# Patient Record
Sex: Female | Born: 1985 | Race: White | Hispanic: No | Smoking: Current every day smoker
Health system: Southern US, Community
[De-identification: ages and names within clinical notes are randomized; demographics above are authoritative.]

## PROBLEM LIST (undated history)

## (undated) DIAGNOSIS — E282 Polycystic ovarian syndrome: Secondary | ICD-10-CM

## (undated) DIAGNOSIS — Z8759 Personal history of other complications of pregnancy, childbirth and the puerperium: Secondary | ICD-10-CM

## (undated) DIAGNOSIS — B009 Herpesviral infection, unspecified: Secondary | ICD-10-CM

## (undated) DIAGNOSIS — F53 Postpartum depression: Secondary | ICD-10-CM

## (undated) DIAGNOSIS — Z3046 Encounter for surveillance of implantable subdermal contraceptive: Secondary | ICD-10-CM

## (undated) DIAGNOSIS — Z3490 Encounter for supervision of normal pregnancy, unspecified, unspecified trimester: Secondary | ICD-10-CM

## (undated) DIAGNOSIS — Z8659 Personal history of other mental and behavioral disorders: Secondary | ICD-10-CM

## (undated) DIAGNOSIS — O99345 Other mental disorders complicating the puerperium: Secondary | ICD-10-CM

## (undated) DIAGNOSIS — Z309 Encounter for contraceptive management, unspecified: Secondary | ICD-10-CM

## (undated) HISTORY — DX: Personal history of other complications of pregnancy, childbirth and the puerperium: Z87.59

## (undated) HISTORY — DX: Encounter for surveillance of implantable subdermal contraceptive: Z30.46

## (undated) HISTORY — DX: Herpesviral infection, unspecified: B00.9

## (undated) HISTORY — DX: Personal history of other mental and behavioral disorders: Z86.59

## (undated) HISTORY — PX: NO PAST SURGERIES: SHX2092

## (undated) HISTORY — DX: Polycystic ovarian syndrome: E28.2

## (undated) HISTORY — DX: Other mental disorders complicating the puerperium: O99.345

## (undated) HISTORY — DX: Encounter for contraceptive management, unspecified: Z30.9

## (undated) HISTORY — DX: Postpartum depression: F53.0

---
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
## 2006-01-16 ENCOUNTER — Ambulatory Visit (HOSPITAL_COMMUNITY): Admission: RE | Admit: 2006-01-16 | Discharge: 2006-01-16 | Payer: Self-pay | Admitting: Family Medicine

## 2007-12-27 ENCOUNTER — Emergency Department (HOSPITAL_COMMUNITY): Admission: EM | Admit: 2007-12-27 | Discharge: 2007-12-27 | Payer: Self-pay | Admitting: Emergency Medicine

## 2009-06-22 ENCOUNTER — Emergency Department (HOSPITAL_COMMUNITY): Admission: EM | Admit: 2009-06-22 | Discharge: 2009-06-22 | Payer: Self-pay | Admitting: Emergency Medicine

## 2010-12-06 ENCOUNTER — Emergency Department (HOSPITAL_COMMUNITY)
Admission: EM | Admit: 2010-12-06 | Discharge: 2010-12-06 | Disposition: A | Discharge status: Home / Self Care | Payer: No Typology Code available for payment source | Attending: Emergency Medicine | Admitting: Emergency Medicine

## 2010-12-06 ENCOUNTER — Encounter: Payer: Self-pay | Admitting: Emergency Medicine

## 2010-12-06 DIAGNOSIS — S71009A Unspecified open wound, unspecified hip, initial encounter: Secondary | ICD-10-CM | POA: Insufficient documentation

## 2010-12-06 DIAGNOSIS — F17200 Nicotine dependence, unspecified, uncomplicated: Secondary | ICD-10-CM | POA: Insufficient documentation

## 2010-12-06 DIAGNOSIS — S71112A Laceration without foreign body, left thigh, initial encounter: Secondary | ICD-10-CM

## 2010-12-06 DIAGNOSIS — S71109A Unspecified open wound, unspecified thigh, initial encounter: Secondary | ICD-10-CM | POA: Insufficient documentation

## 2010-12-06 DIAGNOSIS — W268XXA Contact with other sharp object(s), not elsewhere classified, initial encounter: Secondary | ICD-10-CM | POA: Insufficient documentation

## 2010-12-06 MED ORDER — LIDOCAINE-EPINEPHRINE (PF) 1 %-1:200000 IJ SOLN
INTRAMUSCULAR | Status: AC
  Administered 2010-12-06: 15:00:00
  Filled 2010-12-06: qty 10

## 2010-12-06 MED ORDER — TETANUS-DIPHTH-ACELL PERTUSSIS 5-2.5-18.5 LF-MCG/0.5 IM SUSP
0.5000 mL | Freq: Once | INTRAMUSCULAR | Status: AC
  Administered 2010-12-06: 0.5 mL via INTRAMUSCULAR
  Filled 2010-12-06: qty 0.5
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
## 2010-12-06 NOTE — ED Notes (Signed)
PA in to suture

## 2010-12-07 NOTE — ED Provider Notes (Signed)
Evaluation and management procedures were performed by the PA/NP under my supervision/collaboration.    Felisa Bonier, MD 12/07/10 2118

## 2011-01-19 ENCOUNTER — Encounter (HOSPITAL_COMMUNITY): Payer: Self-pay | Admitting: *Deleted

## 2011-01-19 ENCOUNTER — Emergency Department (HOSPITAL_COMMUNITY): Payer: No Typology Code available for payment source

## 2011-01-19 ENCOUNTER — Emergency Department (HOSPITAL_COMMUNITY)
Admission: EM | Admit: 2011-01-19 | Discharge: 2011-01-19 | Disposition: A | Discharge status: Home / Self Care | Payer: No Typology Code available for payment source | Attending: Emergency Medicine | Admitting: Emergency Medicine

## 2011-01-19 DIAGNOSIS — F17200 Nicotine dependence, unspecified, uncomplicated: Secondary | ICD-10-CM | POA: Insufficient documentation

## 2011-01-19 DIAGNOSIS — R10817 Generalized abdominal tenderness: Secondary | ICD-10-CM | POA: Insufficient documentation

## 2011-01-19 DIAGNOSIS — S301XXA Contusion of abdominal wall, initial encounter: Secondary | ICD-10-CM | POA: Insufficient documentation

## 2011-01-19 DIAGNOSIS — R490 Dysphonia: Secondary | ICD-10-CM | POA: Insufficient documentation

## 2011-01-19 DIAGNOSIS — IMO0002 Reserved for concepts with insufficient information to code with codable children: Secondary | ICD-10-CM | POA: Insufficient documentation

## 2011-01-19 DIAGNOSIS — S1093XA Contusion of unspecified part of neck, initial encounter: Secondary | ICD-10-CM

## 2011-01-19 LAB — POCT PREGNANCY, URINE: Preg Test, Ur: NEGATIVE

## 2011-01-19 LAB — CBC
MCH: 30.1 pg (ref 26.0–34.0)
RBC: 4.85 MIL/uL (ref 3.87–5.11)
RDW: 12.6 % (ref 11.5–15.5)
WBC: 10.7 10*3/uL — ABNORMAL HIGH (ref 4.0–10.5)

## 2011-01-19 LAB — DIFFERENTIAL
Basophils Relative: 1 % (ref 0–1)
Lymphocytes Relative: 24 % (ref 12–46)
Monocytes Relative: 8 % (ref 3–12)
Neutrophils Relative %: 66 % (ref 43–77)

## 2011-01-19 LAB — COMPREHENSIVE METABOLIC PANEL
ALT: 15 U/L (ref 0–35)
AST: 13 U/L (ref 0–37)
Alkaline Phosphatase: 57 U/L (ref 39–117)
BUN: 10 mg/dL (ref 6–23)
Calcium: 9.8 mg/dL (ref 8.4–10.5)
GFR calc non Af Amer: >90 mL/min (ref >90)
Glucose, Bld: 105 mg/dL — ABNORMAL HIGH (ref 70–99)
Total Bilirubin: 0.2 mg/dL — ABNORMAL LOW (ref 0.3–1.2)
Total Protein: 7.5 g/dL (ref 6.0–8.3)

## 2011-01-19 LAB — URINALYSIS, ROUTINE W REFLEX MICROSCOPIC
Bilirubin Urine: NEGATIVE
Glucose, UA: NEGATIVE mg/dL
Hgb urine dipstick: NEGATIVE
Urobilinogen, UA: 0.2 mg/dL (ref 0.0–1.0)

## 2011-01-19 LAB — LIPASE, BLOOD: Lipase: 29 U/L (ref 11–59)

## 2011-01-19 MED ORDER — SODIUM CHLORIDE 0.9 % IV SOLN
INTRAVENOUS | Status: DC
  Administered 2011-01-19: 19:00:00 via INTRAVENOUS

## 2011-01-19 MED ORDER — IOHEXOL 300 MG/ML  SOLN
75.0000 mL | Freq: Once | INTRAMUSCULAR | Status: AC | PRN
  Administered 2011-01-19: 75 mL via INTRAVENOUS

## 2011-01-19 MED ORDER — IBUPROFEN 600 MG PO TABS: 600.0000 mg | ORAL_TABLET | Freq: Four times a day (QID) | ORAL | Status: AC | PRN

## 2011-01-19 MED ORDER — HYDROMORPHONE HCL PF 1 MG/ML IJ SOLN
1.0000 mg | Freq: Once | INTRAMUSCULAR | Status: AC
  Administered 2011-01-19: 1 mg via INTRAVENOUS
  Filled 2011-01-19: qty 1

## 2011-01-19 MED ORDER — KETOROLAC TROMETHAMINE 30 MG/ML IJ SOLN
30.0000 mg | Freq: Once | INTRAMUSCULAR | Status: AC
  Administered 2011-01-19: 30 mg via INTRAVENOUS
  Filled 2011-01-19: qty 1

## 2011-01-19 MED ORDER — DEXAMETHASONE SODIUM PHOSPHATE 10 MG/ML IJ SOLN
10.0000 mg | Freq: Once | INTRAMUSCULAR | Status: AC
  Administered 2011-01-19: 10 mg via INTRAVENOUS
  Filled 2011-01-19: qty 1

## 2011-01-19 MED ORDER — HYDROCODONE-ACETAMINOPHEN 5-325 MG PO TABS: 2.0000 | ORAL_TABLET | ORAL | Status: AC | PRN
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
## 2011-01-19 NOTE — ED Notes (Signed)
Patient transported to CT. NAD noted.  
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
## 2011-03-14 ENCOUNTER — Ambulatory Visit (HOSPITAL_COMMUNITY)
Admission: RE | Admit: 2011-03-14 | Discharge: 2011-03-14 | Disposition: A | Discharge status: Home / Self Care | Payer: Federal, State, Local not specified - PPO | Source: Ambulatory Visit | Attending: Family Medicine | Admitting: Family Medicine

## 2011-03-14 ENCOUNTER — Other Ambulatory Visit: Payer: Self-pay | Admitting: Family Medicine

## 2011-03-14 DIAGNOSIS — R1031 Right lower quadrant pain: Secondary | ICD-10-CM | POA: Insufficient documentation

## 2011-03-14 DIAGNOSIS — R52 Pain, unspecified: Secondary | ICD-10-CM

## 2011-04-21 ENCOUNTER — Emergency Department (HOSPITAL_COMMUNITY): Payer: No Typology Code available for payment source

## 2011-04-21 ENCOUNTER — Emergency Department (HOSPITAL_COMMUNITY)
Admission: EM | Admit: 2011-04-21 | Discharge: 2011-04-21 | Disposition: A | Discharge status: Home / Self Care | Payer: No Typology Code available for payment source | Attending: Emergency Medicine | Admitting: Emergency Medicine

## 2011-04-21 ENCOUNTER — Encounter (HOSPITAL_COMMUNITY): Payer: Self-pay | Admitting: Emergency Medicine

## 2011-04-21 DIAGNOSIS — T07XXXA Unspecified multiple injuries, initial encounter: Secondary | ICD-10-CM

## 2011-04-21 DIAGNOSIS — S0120XA Unspecified open wound of nose, initial encounter: Secondary | ICD-10-CM | POA: Insufficient documentation

## 2011-04-21 DIAGNOSIS — R079 Chest pain, unspecified: Secondary | ICD-10-CM | POA: Insufficient documentation

## 2011-04-21 DIAGNOSIS — S0121XA Laceration without foreign body of nose, initial encounter: Secondary | ICD-10-CM

## 2011-04-21 DIAGNOSIS — S0180XA Unspecified open wound of other part of head, initial encounter: Secondary | ICD-10-CM | POA: Insufficient documentation

## 2011-04-21 DIAGNOSIS — R51 Headache: Secondary | ICD-10-CM | POA: Insufficient documentation

## 2011-04-21 DIAGNOSIS — R1032 Left lower quadrant pain: Secondary | ICD-10-CM | POA: Insufficient documentation

## 2011-04-21 DIAGNOSIS — M79609 Pain in unspecified limb: Secondary | ICD-10-CM | POA: Insufficient documentation

## 2011-04-21 LAB — URINALYSIS, ROUTINE W REFLEX MICROSCOPIC
Bilirubin Urine: NEGATIVE
Ketones, ur: NEGATIVE mg/dL
Leukocytes, UA: NEGATIVE
Nitrite: NEGATIVE
Specific Gravity, Urine: 1.017 (ref 1.005–1.030)
Urobilinogen, UA: 1 mg/dL (ref 0.0–1.0)

## 2011-04-21 LAB — BASIC METABOLIC PANEL
BUN: 5 mg/dL — ABNORMAL LOW (ref 6–23)
Chloride: 111 mEq/L (ref 96–112)
Creatinine, Ser: 0.52 mg/dL (ref 0.50–1.10)
GFR calc Af Amer: >90 mL/min (ref >90)
GFR calc non Af Amer: >90 mL/min (ref >90)
Potassium: 3.3 mEq/L — ABNORMAL LOW (ref 3.5–5.1)

## 2011-04-21 LAB — URINE MICROSCOPIC-ADD ON

## 2011-04-21 LAB — CBC
MCV: 87.9 fL (ref 78.0–100.0)
Platelets: 146 10*3/uL — ABNORMAL LOW (ref 150–400)
RBC: 4.89 MIL/uL (ref 3.87–5.11)
RDW: 12.9 % (ref 11.5–15.5)
WBC: 11.7 10*3/uL — ABNORMAL HIGH (ref 4.0–10.5)

## 2011-04-21 LAB — ETHANOL: Alcohol, Ethyl (B): 61 mg/dL — ABNORMAL HIGH (ref 0–11)

## 2011-04-21 MED ORDER — FENTANYL CITRATE 0.05 MG/ML IJ SOLN
100.0000 ug | Freq: Once | INTRAMUSCULAR | Status: AC
  Administered 2011-04-21: 100 ug via INTRAVENOUS
  Filled 2011-04-21: qty 2

## 2011-04-21 MED ORDER — BACITRACIN ZINC 500 UNIT/GM EX OINT: TOPICAL_OINTMENT | Freq: Two times a day (BID) | CUTANEOUS | Status: AC

## 2011-04-21 MED ORDER — TETANUS-DIPHTH-ACELL PERTUSSIS 5-2.5-18.5 LF-MCG/0.5 IM SUSP
0.5000 mL | Freq: Once | INTRAMUSCULAR | Status: DC
  Filled 2011-04-21: qty 0.5

## 2011-04-21 MED ORDER — OXYCODONE-ACETAMINOPHEN 5-325 MG PO TABS
2.0000 | ORAL_TABLET | Freq: Once | ORAL | Status: AC
  Administered 2011-04-21: 2 via ORAL
  Filled 2011-04-21: qty 2

## 2011-04-21 MED ORDER — IOHEXOL 300 MG/ML  SOLN: 100.0000 mL | Freq: Once | INTRAMUSCULAR | Status: DC | PRN

## 2011-04-21 MED ORDER — IOHEXOL 300 MG/ML  SOLN
100.0000 mL | Freq: Once | INTRAMUSCULAR | Status: AC | PRN
  Administered 2011-04-21: 100 mL via INTRAVENOUS

## 2011-04-21 MED ORDER — HYDROCODONE-ACETAMINOPHEN 5-500 MG PO TABS: 1.0000 | ORAL_TABLET | Freq: Four times a day (QID) | ORAL | Status: AC | PRN

## 2011-04-21 MED ORDER — NAPROXEN 500 MG PO TABS: 500.0000 mg | ORAL_TABLET | Freq: Two times a day (BID) | ORAL | Status: AC
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
## 2011-04-21 NOTE — ED Provider Notes (Signed)
LACERATION REPAIR Performed by: Anne Shutter, Latissa Frick Authorized by: Anne Shutter, Herbert Seta Consent: Verbal consent obtained. Risks and benefits: risks, benefits and alternatives were discussed Consent given by: patient Patient identity confirmed: provided demographic data Prepped and Draped in normal sterile fashion Wound explored  Laceration Location: bridge of nose  Laceration Length: 2. cm   No Foreign Bodies seen or palpated  Anesthesia: local infiltration  Local anesthetic: lidocaine 2% with epinephrine  Anesthetic total:  3 ml  Irrigation method: syringe Amount of cleaning: standard  Skin closure: 6-0 Nylon  Number of sutures: 4   Technique: simple interrupted  Patient tolerance: Patient tolerated the procedure well with no immediate complications.  Pascal Lux [place], PA-C 04/21/11 [phone]
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
## 2011-04-21 NOTE — ED Notes (Signed)
PA at bedside suturing.

## 2011-04-21 NOTE — Discharge Instructions (Signed)
Your x-rays show no signs of internal injuries.  Please use the antibiotic cream on the wound, have your family doctor recheck your stitches within 2 days to make sure there becoming infected. The stitches on her face should, within 5 days by her family doctor or the emergency department.  Take Naprosyn twice a day, hydrocodone for severe pain
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
## 2011-04-23 NOTE — ED Provider Notes (Signed)
Medical screening examination/treatment/procedure(s) were conducted as a shared visit with non-physician practitioner(s) and myself.  I personally evaluated the patient during the encounter  Please see my separate respective documentation pertaining to this patient encounter    Vida Roller, MD 04/23/11 [phone]

## 2012-04-29 ENCOUNTER — Emergency Department (HOSPITAL_COMMUNITY)
Admission: EM | Admit: 2012-04-29 | Discharge: 2012-04-29 | Disposition: A | Discharge status: Home / Self Care | Payer: Federal, State, Local not specified - PPO | Attending: Emergency Medicine | Admitting: Emergency Medicine

## 2012-04-29 ENCOUNTER — Encounter (HOSPITAL_COMMUNITY): Payer: Self-pay | Admitting: *Deleted

## 2012-04-29 DIAGNOSIS — F17200 Nicotine dependence, unspecified, uncomplicated: Secondary | ICD-10-CM | POA: Insufficient documentation

## 2012-04-29 DIAGNOSIS — K047 Periapical abscess without sinus: Secondary | ICD-10-CM

## 2012-04-29 DIAGNOSIS — H9209 Otalgia, unspecified ear: Secondary | ICD-10-CM | POA: Insufficient documentation

## 2012-04-29 DIAGNOSIS — R51 Headache: Secondary | ICD-10-CM | POA: Insufficient documentation

## 2012-04-29 DIAGNOSIS — Z8742 Personal history of other diseases of the female genital tract: Secondary | ICD-10-CM | POA: Insufficient documentation

## 2012-04-29 DIAGNOSIS — K029 Dental caries, unspecified: Secondary | ICD-10-CM | POA: Insufficient documentation

## 2012-04-29 MED ORDER — AMOXICILLIN 250 MG PO CAPS: 250.0000 mg | ORAL_CAPSULE | Freq: Three times a day (TID) | ORAL | Status: DC

## 2012-04-29 MED ORDER — IBUPROFEN 600 MG PO TABS: 600.0000 mg | ORAL_TABLET | Freq: Four times a day (QID) | ORAL | Status: DC | PRN

## 2012-04-29 MED ORDER — TRAMADOL HCL 50 MG PO TABS: 50.0000 mg | ORAL_TABLET | Freq: Four times a day (QID) | ORAL | Status: DC | PRN

## 2012-04-29 NOTE — ED Provider Notes (Signed)
History     CSN: [id number]  Arrival date & time 04/29/12  1437   First MD Initiated Contact with Patient 04/29/12 1603      Chief Complaint  Patient presents with  . Dental Pain    (Consider location/radiation/quality/duration/timing/severity/associated sxs/prior treatment) HPI Ashley Levy is a 27 y.o. female who presents to the ED with dental pain. The pain started 3 days ago. She describes the pain as throbbing. She rates the pain as 8/10. She has tried salt water, vinegar and all the suggestions she found on Google.  Nothing has helped the pain. She has had dental problems in the past and does not have a dentist. She denies fever, chills, nausea or vomiting. The history was provided by the patient.  Past Medical History  Diagnosis Date  . Polycystic ovarian syndrome     Past Surgical History  Procedure Laterality Date  . Fracture surgery      No family history on file.  History  Substance Use Topics  . Smoking status: Current Every Day Smoker -- 1.00 packs/day    Types: Cigarettes  . Smokeless tobacco: Not on file  . Alcohol Use: Yes     Comment: occ. use    OB History   Grav Para Term Preterm Abortions TAB SAB Ect Mult Living                  Review of Systems  Constitutional: Negative for fever and chills.  HENT: Positive for ear pain and dental problem. Negative for facial swelling and neck pain.   Respiratory: Negative for cough and wheezing.   Cardiovascular: Negative for chest pain.  Gastrointestinal: Negative for nausea, vomiting and abdominal pain.  Musculoskeletal: Negative for back pain.  Skin: Negative for wound.  Neurological: Positive for headaches. Negative for syncope.  Psychiatric/Behavioral: Negative for confusion. The patient is not nervous/anxious.     Allergies  Review of patient's allergies indicates no known allergies.  Home Medications  No current outpatient prescriptions on file.  BP 126/81  Pulse 69  Temp(Src) 98.7 F  (37.1 C) (Oral)  Resp 18  Ht 5' 1.5" (1.562 m)  Wt 147 lb (66.679 kg)  BMI 27.33 kg/m2  SpO2 98%  Physical Exam  Nursing note and vitals reviewed. Constitutional: She is oriented to person, place, and time. She appears well-developed and well-nourished. No distress.  HENT:  Head: Normocephalic and atraumatic.  Right Ear: Tympanic membrane normal.  Left Ear: Tympanic membrane normal.  Nose: Nose normal.  Mouth/Throat: Uvula is midline, oropharynx is clear and moist and mucous membranes are normal. Dental abscesses and dental caries present.    abscessed tooth  Eyes: EOM are normal. Pupils are equal, round, and reactive to light.  Neck: Neck supple.  Cardiovascular: Normal rate and regular rhythm.   Pulmonary/Chest: Effort normal.  Abdominal: Soft. There is no tenderness.  Musculoskeletal: Normal range of motion. She exhibits no edema.  Neurological: She is alert and oriented to person, place, and time. No cranial nerve deficit.  Skin: Skin is warm and dry.  Psychiatric: She has a normal mood and affect. Her behavior is normal. Judgment and thought content normal.   Assessment: 27 y.o. female with dental pain   Abscessed tooth  Plan:  Amoxil   Pain management   Follow up with dentist.   Procedures   MDM  I have reviewed this patient's vital signs, nurses notes, examined the patient and discussed with the patient clinical findings and plan of care. Patient  voices understanding.    Medication List    TAKE these medications       amoxicillin 250 MG capsule  Commonly known as:  AMOXIL  Take 1 capsule (250 mg total) by mouth 3 (three) times daily.     ibuprofen 600 MG tablet  Commonly known as:  ADVIL,MOTRIN  Take 1 tablet (600 mg total) by mouth every 6 (six) hours as needed for pain.     traMADol 50 MG tablet  Commonly known as:  ULTRAM  Take 1 tablet (50 mg total) by mouth every 6 (six) hours as needed for pain.               Janne Napoleon, [place] 04/29/12  [phone]

## 2012-04-29 NOTE — ED Provider Notes (Signed)
Medical screening examination/treatment/procedure(s) were performed by non-physician practitioner and as supervising physician I was immediately available for consultation/collaboration. Devoria Albe, MD, Arm[facility] Gang   Ward Givens, MD 04/29/12 [phone]

## 2012-04-29 NOTE — ED Notes (Signed)
Toothache for 3 days. 

## 2012-04-29 NOTE — ED Notes (Signed)
nad noted prior to dc. Dc instructions reviewed and 3 scripts given to pt prior to dc

## 2012-12-30 ENCOUNTER — Other Ambulatory Visit (HOSPITAL_COMMUNITY)
Admission: RE | Admit: 2012-12-30 | Discharge: 2012-12-30 | Disposition: A | Discharge status: Home / Self Care | Payer: Federal, State, Local not specified - PPO | Source: Ambulatory Visit | Attending: Obstetrics and Gynecology | Admitting: Obstetrics and Gynecology

## 2012-12-30 ENCOUNTER — Encounter: Payer: Self-pay | Admitting: Advanced Practice Midwife

## 2012-12-30 ENCOUNTER — Ambulatory Visit (INDEPENDENT_AMBULATORY_CARE_PROVIDER_SITE_OTHER): Payer: Federal, State, Local not specified - PPO | Admitting: Advanced Practice Midwife

## 2012-12-30 VITALS — BP 98/60 | Ht 61.5 in | Wt 151.0 lb

## 2012-12-30 DIAGNOSIS — Z01419 Encounter for gynecological examination (general) (routine) without abnormal findings: Secondary | ICD-10-CM

## 2012-12-30 DIAGNOSIS — Z3202 Encounter for pregnancy test, result negative: Secondary | ICD-10-CM

## 2012-12-30 LAB — POCT URINE PREGNANCY: Preg Test, Ur: NEGATIVE

## 2012-12-30 MED ORDER — TERCONAZOLE 0.4 % VA CREA: 1.0000 | TOPICAL_CREAM | Freq: Every day | VAGINAL | Status: DC

## 2012-12-30 NOTE — Progress Notes (Signed)
Ashley Levy 27 y.o.  Filed Vitals:   12/30/12 1053  BP: 98/60     Past Medical History: Past Medical History  Diagnosis Date  . Polycystic ovarian syndrome     Past Surgical History: Past Surgical History  Procedure Laterality Date  . Fracture surgery      ankles    Family History: Family History  Problem Relation Age of Onset  . Asthma Mother     Social History: History  Substance Use Topics  . Smoking status: Current Every Day Smoker -- 0.25 packs/day    Types: Cigarettes  . Smokeless tobacco: Never Used  . Alcohol Use: No    Allergies: No Known Allergies   History of Present Illness:   Here for well woman exam.  Is currently having unprotected intercourse (last encounter 6 days ago), but is scheduled for Qhcg/Nexplanon next week.  Requests rx for terconazole that she uses prn external yeast.  Current medications reviewed in [facility] electronic medical record.     Review of Systems   Patient denies any headaches, blurred vision, shortness of breath, chest pain, abdominal pain, problems with bowel movements, urination, or intercourse.   Physical Exam: General:  Well developed, well nourished, no acute distress Skin:  Warm and dry Neck:  Midline trachea, normal thyroid Lungs; Clear to auscultation bilaterally Breast:  No dominant palpable mass, retraction, or nipple discharge Cardiovascular: Regular rate and rhythm Abdomen:  Soft, non tender, no hepatosplenomegaly Pelvic:  External genitalia is normal in appearance except small fissure between labia majora and minora. The vagina is normal in appearance.    The cervix is bulbous.  Uterus is felt to be normal size, shape, and contour.  No adnexal masses or tenderness noted. Rectal: deferred   Extremities:  No swelling or varicosities noted Psych:  No mood changes   Impression:  Normal well woman exam   Plan: No intercourse until Nexplanon.

## 2013-01-01 ENCOUNTER — Telehealth: Payer: Self-pay | Admitting: *Deleted

## 2013-01-01 NOTE — Telephone Encounter (Signed)
Victorino Dike advised that the pt should come in and decide which lab work she wants or needs.

## 2013-01-01 NOTE — Telephone Encounter (Signed)
Pt's mom wants to have everything checked. And wants her daughter to have hormones checked and also wants a STD screening, along with normal fasting labs. Pt's mother is very concerned about the pt's health and wants everything checked before she comes off her insurance.

## 2013-01-04 ENCOUNTER — Other Ambulatory Visit: Payer: Federal, State, Local not specified - PPO

## 2013-01-04 ENCOUNTER — Encounter: Payer: Self-pay | Admitting: Adult Health

## 2013-01-04 ENCOUNTER — Ambulatory Visit (INDEPENDENT_AMBULATORY_CARE_PROVIDER_SITE_OTHER): Payer: Federal, State, Local not specified - PPO | Admitting: Adult Health

## 2013-01-04 VITALS — BP 122/74 | Ht 61.5 in | Wt 154.0 lb

## 2013-01-04 DIAGNOSIS — Z113 Encounter for screening for infections with a predominantly sexual mode of transmission: Secondary | ICD-10-CM

## 2013-01-04 DIAGNOSIS — Z1329 Encounter for screening for other suspected endocrine disorder: Secondary | ICD-10-CM

## 2013-01-04 DIAGNOSIS — R768 Other specified abnormal immunological findings in serum: Secondary | ICD-10-CM

## 2013-01-04 DIAGNOSIS — Z3200 Encounter for pregnancy test, result unknown: Secondary | ICD-10-CM

## 2013-01-04 DIAGNOSIS — Z13220 Encounter for screening for lipoid disorders: Secondary | ICD-10-CM

## 2013-01-04 DIAGNOSIS — Z0000 Encounter for general adult medical examination without abnormal findings: Secondary | ICD-10-CM

## 2013-01-04 DIAGNOSIS — Z30017 Encounter for initial prescription of implantable subdermal contraceptive: Secondary | ICD-10-CM

## 2013-01-04 LAB — COMPREHENSIVE METABOLIC PANEL
AST: 13 U/L (ref 0–37)
Alkaline Phosphatase: 50 U/L (ref 39–117)
BUN: 10 mg/dL (ref 6–23)
Calcium: 9.6 mg/dL (ref 8.4–10.5)
Chloride: 104 mEq/L (ref 96–112)
Creat: 0.69 mg/dL (ref 0.50–1.10)
Total Bilirubin: 0.3 mg/dL (ref 0.3–1.2)

## 2013-01-04 LAB — HCG, QUANTITATIVE, PREGNANCY: hCG, Beta Chain, Quant, S: <1 m[IU]/mL

## 2013-01-04 LAB — LIPID PANEL
HDL: 44 mg/dL (ref >39)
Total CHOL/HDL Ratio: 2.5 Ratio
VLDL: 15 mg/dL (ref 0–40)

## 2013-01-04 LAB — CBC
Platelets: 156 10*3/uL (ref 150–400)
RBC: 4.64 MIL/uL (ref 3.87–5.11)
RDW: 13.9 % (ref 11.5–15.5)
WBC: 7.1 10*3/uL (ref 4.0–10.5)

## 2013-01-04 NOTE — Patient Instructions (Signed)
Use condoms x 2 weeks, keep clean and dry x 24 hours, no heavy lifting, keep steri strips on x 72 hours, Keep pressure dressing on x 24 hours. Follow up prn problems.  

## 2013-01-04 NOTE — Progress Notes (Signed)
Subjective:     Patient ID: Ashley Levy, female   DOB: 1985/10/24, 27 y.o.   MRN: [id number]  HPI Ashley Levy is a 27 year old white female in for nexplanon insertion.Last sex 10 days ago, and QHCG was negative today.  Review of Systems See HPI Reviewed past medical,surgical, social and family history. Reviewed medications and allergies.     Objective:   Physical Exam BP 122/74  Ht 5' 1.5" (1.562 m)  Wt 154 lb (69.854 kg)  BMI 28.63 kg/m2  LMP 11/10/2012   QHCG negative, consent signed,  Right arm cleansed with betadine, and injected with 1.5 cc 2% lidocaine and waited til numb. Nexplanon easily inserted and steri strips applied.Rod palpated byt pt and provider. Pressure dressing applied.  Assessment:     Nexplanon insertion lot [id number] exp 2/17    Plan:     Use condoms x 2 weeks, keep clean and dry x 24 hours, no heavy lifting, keep steri strips on x 72 hours, Keep pressure dressing on x 24 hours. Follow up prn problems.   Note given for work no heavy lifting x 24 hours

## 2013-01-05 LAB — HSV 2 ANTIBODY, IGG: HSV 2 Glycoprotein G Ab, IgG: 11.02 IV — ABNORMAL HIGH

## 2013-01-05 LAB — RPR

## 2013-01-05 LAB — HIV ANTIBODY (ROUTINE TESTING W REFLEX): HIV: NONREACTIVE

## 2013-01-06 ENCOUNTER — Other Ambulatory Visit: Payer: Self-pay | Admitting: Advanced Practice Midwife

## 2013-01-06 DIAGNOSIS — R768 Other specified abnormal immunological findings in serum: Secondary | ICD-10-CM | POA: Insufficient documentation

## 2013-01-06 NOTE — Progress Notes (Signed)
  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 With current partner X 1 month.  Encouraged to tell him and suggest testing before suppressive therapy

## 2013-06-14 ENCOUNTER — Encounter (HOSPITAL_COMMUNITY): Payer: Self-pay | Admitting: Emergency Medicine

## 2013-06-14 ENCOUNTER — Emergency Department (HOSPITAL_COMMUNITY)
Admission: EM | Admit: 2013-06-14 | Discharge: 2013-06-14 | Disposition: A | Discharge status: Home / Self Care | Payer: BC Managed Care – PPO | Attending: Emergency Medicine | Admitting: Emergency Medicine

## 2013-06-14 ENCOUNTER — Emergency Department (HOSPITAL_COMMUNITY): Payer: BC Managed Care – PPO

## 2013-06-14 DIAGNOSIS — F17200 Nicotine dependence, unspecified, uncomplicated: Secondary | ICD-10-CM | POA: Insufficient documentation

## 2013-06-14 DIAGNOSIS — Z8639 Personal history of other endocrine, nutritional and metabolic disease: Secondary | ICD-10-CM | POA: Insufficient documentation

## 2013-06-14 DIAGNOSIS — W172XXA Fall into hole, initial encounter: Secondary | ICD-10-CM | POA: Insufficient documentation

## 2013-06-14 DIAGNOSIS — Y9301 Activity, walking, marching and hiking: Secondary | ICD-10-CM | POA: Insufficient documentation

## 2013-06-14 DIAGNOSIS — Z79899 Other long term (current) drug therapy: Secondary | ICD-10-CM | POA: Insufficient documentation

## 2013-06-14 DIAGNOSIS — Z862 Personal history of diseases of the blood and blood-forming organs and certain disorders involving the immune mechanism: Secondary | ICD-10-CM | POA: Insufficient documentation

## 2013-06-14 DIAGNOSIS — Y929 Unspecified place or not applicable: Secondary | ICD-10-CM | POA: Insufficient documentation

## 2013-06-14 DIAGNOSIS — Z791 Long term (current) use of non-steroidal anti-inflammatories (NSAID): Secondary | ICD-10-CM | POA: Insufficient documentation

## 2013-06-14 DIAGNOSIS — S93409A Sprain of unspecified ligament of unspecified ankle, initial encounter: Secondary | ICD-10-CM | POA: Insufficient documentation

## 2013-06-14 MED ORDER — ACETAMINOPHEN-CODEINE #3 300-30 MG PO TABS: 1.0000 | ORAL_TABLET | Freq: Four times a day (QID) | ORAL | Status: DC | PRN

## 2013-06-14 MED ORDER — IBUPROFEN 800 MG PO TABS: 800.0000 mg | ORAL_TABLET | Freq: Three times a day (TID) | ORAL | Status: DC

## 2013-06-14 MED ORDER — ACETAMINOPHEN-CODEINE #3 300-30 MG PO TABS
2.0000 | ORAL_TABLET | Freq: Once | ORAL | Status: AC
  Administered 2013-06-14: 2 via ORAL
  Filled 2013-06-14: qty 2

## 2013-06-14 MED ORDER — IBUPROFEN 800 MG PO TABS
800.0000 mg | ORAL_TABLET | Freq: Once | ORAL | Status: AC
  Administered 2013-06-14: 800 mg via ORAL
  Filled 2013-06-14: qty 1

## 2013-06-14 MED ORDER — ONDANSETRON HCL 4 MG PO TABS
4.0000 mg | ORAL_TABLET | Freq: Once | ORAL | Status: AC
  Administered 2013-06-14: 4 mg via ORAL
  Filled 2013-06-14: qty 1

## 2013-06-14 NOTE — ED Notes (Signed)
Pt verbalized understanding of no driving and use caution within 4 hours of taking Tylenol #3

## 2013-06-14 NOTE — Discharge Instructions (Signed)
Please use the ankle splint for the next 10 days. Use of crutches until you're able to safely apply weight to your ankle. Tylenol with Codeine may cause drowsiness, please use with caution. Please see the orthopedic specialist for additional evaluation if not improving. Ankle Sprain An ankle sprain is an injury to the strong, fibrous tissues (ligaments) that hold your ankle bones together.  HOME CARE   Put ice on your ankle for 1 2 days or as told by your doctor.  Put ice in a plastic bag.  Place a towel between your skin and the bag.  Leave the ice on for 15-20 minutes at a time, every 2 hours while you are awake.  Only take medicine as told by your doctor.  Raise (elevate) your injured ankle above the level of your heart as much as possible for 2 3 days.  Use crutches if your doctor tells you to. Slowly put your own weight on the affected ankle. Use the crutches until you can walk without pain.  If you have a plaster splint:  Do not rest it on anything harder than a pillow for 24 hours.  Do not put weight on it.  Do not get it wet.  Take it off to shower or bathe.  If given, use an elastic wrap or support stocking for support. Take the wrap off if your toes lose feeling (numb), tingle, or turn cold or blue.  If you have an air splint:  Add or let out air to make it comfortable.  Take it off at night and to shower and bathe.  Wiggle your toes and move your ankle up and down often while you are wearing it. GET HELP RIGHT AWAY IF:   Your toes lose feeling (numb) or turn blue.  You have severe pain that is increasing.  You have rapidly increasing bruising or puffiness (swelling).  Your toes feel very cold.  You lose feeling in your foot.  Your medicine does not help your pain. MAKE SURE YOU:   Understand these instructions.  Will watch your condition.  Will get help right away if you are not doing well or get worse. Document Released: 07/17/2007 Document  Revised: 10/23/2011 Document Reviewed: 08/12/2011 [facility] Patient Information 2014 [employer], [place].

## 2013-06-14 NOTE — ED Notes (Signed)
Stepped in a hole on Saturday. Wrapped in an ace.  Pain rt ankle

## 2013-06-14 NOTE — ED Provider Notes (Signed)
CSN: [id number]     Arrival date & time 06/14/13  1118 History  This chart was scribed for non-physician practitioner, Kathie DikeHobson M Mycah Mcdougall, PA-C, working with Ward GivensIva L Knapp, MD by Charline BillsEssence Howell, ED Scribe. This patient was seen in room APFT22/APFT22 and the patient's care was started at 1:20 PM.    Chief Complaint  Patient presents with  . Ankle Pain    Patient is a 28 y.o. female presenting with ankle pain. The history is provided by the patient. No language interpreter was used.  Ankle Pain Location:  Ankle Time since incident:  2 days Injury: yes   Mechanism of injury: fall   Fall:    Fall occurred:  Walking   Impact surface:  Chartered loss adjusterGrass   Point of impact:  Unable to specify Ankle location:  R ankle Pain details:    Quality:  Aching   Radiates to:  Does not radiate   Severity:  Moderate   Onset quality:  Sudden   Duration:  2 days   Timing:  Intermittent   Progression:  Worsening Chronicity:  New Dislocation: no   Prior injury to area:  No Relieved by:  Nothing Ineffective treatments: wrapping the right ankle. Associated symptoms: decreased ROM   Associated symptoms: no numbness and no tingling   Risk factors: no frequent fractures and no recent illness    HPI Comments: Ashley Levy is a 28 y.o. female who presents to the Emergency Department complaining of R ankle pain onset 2 days ago after stepping in a hole. Pt states that she twisted her ankle and fell to the ground. She has broken both ankles as a child but has never had hardware placed in either ankle. Pt has tried ice and elevation with no relief. Pt is not taking blood thinners and does not have a h/o blood disorders.   Past Medical History  Diagnosis Date  . Polycystic ovarian syndrome    History reviewed. No pertinent past surgical history. Family History  Problem Relation Age of Onset  . Asthma Mother   . Cancer Paternal Grandfather    History  Substance Use Topics  . Smoking status: Current Every Day Smoker --  0.50 packs/day for 2 years    Types: Cigarettes  . Smokeless tobacco: Never Used  . Alcohol Use: No   OB History   Grav Para Term Preterm Abortions TAB SAB Ect Mult Living                 Review of Systems  Musculoskeletal: Positive for arthralgias.  All other systems reviewed and are negative.  Allergies  Review of patient's allergies indicates no known allergies.  Home Medications   Prior to Admission medications   Medication Sig Start Date End Date Taking? Authorizing Provider  etonogestrel (NEXPLANON) 68 MG IMPL implant Inject 1 each into the skin once.    Historical Provider, MD  ibuprofen (ADVIL,MOTRIN) 600 MG tablet Take 1 tablet (600 mg total) by mouth every 6 (six) hours as needed for pain. 04/29/12   Hope Orlene OchM Neese, NP  terconazole (TERAZOL 7) 0.4 % vaginal cream Place 1 applicator vaginally at bedtime. 12/30/12   Jacklyn ShellFrances Cresenzo-Dishmon, CNM   Triage Vitals: BP 112/86  Pulse 84  Temp(Src) 98 F (36.7 C) (Oral)  Resp 20  Ht 5' 1.5" (1.562 m)  Wt 155 lb (70.308 kg)  BMI 28.82 kg/m2  SpO2 96%  LMP 06/14/2013 Physical Exam  Nursing note and vitals reviewed. Constitutional: She is oriented to person, place,  and time. She appears well-developed and well-nourished.  Non-toxic appearance. She does not appear ill. No distress.  HENT:  Head: Normocephalic and atraumatic.  Right Ear: External ear normal.  Left Ear: External ear normal.  Nose: Nose normal. No mucosal edema or rhinorrhea.  Mouth/Throat: Oropharynx is clear and moist and mucous membranes are normal. No dental abscesses or uvula swelling.  Eyes: Conjunctivae and EOM are normal. Pupils are equal, round, and reactive to light.  Neck: Normal range of motion and full passive range of motion without pain. Neck supple.  Cardiovascular: Normal rate, regular rhythm and normal heart sounds.  Exam reveals no gallop and no friction rub.   No murmur heard. Pulses:      Dorsalis pedis pulses are 2+ on the right side.        Posterior tibial pulses are 2+ on the right side.  Pulmonary/Chest: Effort normal and breath sounds normal. No respiratory distress. She has no wheezes. She has no rhonchi. She has no rales. She exhibits no tenderness and no crepitus.  Abdominal: Soft. Normal appearance and bowel sounds are normal. She exhibits no distension. There is no tenderness. There is no rebound and no guarding.  Musculoskeletal: Normal range of motion. She exhibits tenderness. She exhibits no edema.       Right ankle: She exhibits swelling. Achilles tendon normal.  Moves all extremities well.  Capillary refill of R foot is less than 2 seconds Full ROM in toes Achilles tendon is intact Pain, mild swelling and bruising of lateral malleolus Mild swelling of dorsum of foot Full ROM of R hip and knee  Neurological: She is alert and oriented to person, place, and time. She has normal strength. No cranial nerve deficit.  Skin: Skin is warm, dry and intact. No rash noted. No erythema. No pallor.  Psychiatric: She has a normal mood and affect. Her speech is normal and behavior is normal. Her mood appears not anxious.    ED Course  Procedures (including critical care time) DIAGNOSTIC STUDIES: Oxygen Saturation is 96% on RA, adequate by my interpretation.    COORDINATION OF CARE: 1:28 PM-Discussed treatment plan which includes ankle brace for support with parent at bedside and they agreed to plan.   Labs Review Labs Reviewed - No data to display  Imaging Review Dg Ankle Complete Right  06/14/2013   CLINICAL DATA:  Injury.  Right ankle pain.  EXAM: RIGHT ANKLE - COMPLETE 3+ VIEW  COMPARISON:  None.  FINDINGS: No acute bony or joint abnormality is identified. Tiny well corticated bony fragments projecting over the talonavicular joint may be due to old trauma. Soft tissues are unremarkable.  IMPRESSION: No acute finding.   Electronically Signed   By: Drusilla Kannerhomas  Dalessio M.D.   On: 06/14/2013 13:12     EKG  Interpretation None      MDM No acute fx. No dislocation. Xray evidence of old trauma to the right ankle. Rx. For tylenol #3 and ibuprofen given. ASO splint also given.   Final diagnoses:  None    **I have reviewed nursing notes, vital signs, and all appropriate lab and imaging results for this patient.*  I personally performed the services described in this documentation, which was scribed in my presence. The recorded information has been reviewed and is accurate.    Kathie DikeHobson M Tashia Leiterman, PA-C 06/14/13 [phone]

## 2013-06-15 NOTE — ED Provider Notes (Signed)
Medical screening examination/treatment/procedure(s) were performed by non-physician practitioner and as supervising physician I was immediately available for consultation/collaboration.   EKG Interpretation None      Armetta Henri, MD, FACEP   Njeri Vicente L Mearle Drew, MD 06/15/13 1540 

## 2013-07-16 ENCOUNTER — Telehealth: Payer: Self-pay | Admitting: Obstetrics and Gynecology

## 2013-07-16 NOTE — Telephone Encounter (Signed)
Pt states that she got the Nexplanon in November, she states that bloating in her stomach and she feels tired all the time, pt states that she feels depressed. Pt states that she did not have any of these feelings before the nexplanon was inserted. Pt states that she has had irregular bleeding, but has not ha a period in a month. Pt states that she doesn't want the nexplanon anymore and if she makes an appointment she wants to go ahead and get it removed.   Pt advised that she should make an appointment to see a provider and discuss these issues with them. Pt was switched up front and spoke with Amy regarding insurance and nexplanon removal.

## 2013-07-20 ENCOUNTER — Ambulatory Visit (INDEPENDENT_AMBULATORY_CARE_PROVIDER_SITE_OTHER): Payer: BC Managed Care – PPO | Admitting: *Deleted

## 2013-07-20 DIAGNOSIS — Z111 Encounter for screening for respiratory tuberculosis: Secondary | ICD-10-CM

## 2013-07-26 ENCOUNTER — Ambulatory Visit (INDEPENDENT_AMBULATORY_CARE_PROVIDER_SITE_OTHER): Payer: BC Managed Care – PPO | Admitting: *Deleted

## 2013-07-26 DIAGNOSIS — Z111 Encounter for screening for respiratory tuberculosis: Secondary | ICD-10-CM

## 2013-07-29 LAB — TB SKIN TEST
Induration: 0 mm
TB Skin Test: NEGATIVE

## 2013-08-02 ENCOUNTER — Ambulatory Visit (INDEPENDENT_AMBULATORY_CARE_PROVIDER_SITE_OTHER): Payer: BC Managed Care – PPO | Admitting: Adult Health

## 2013-08-02 ENCOUNTER — Encounter: Payer: Self-pay | Admitting: Adult Health

## 2013-08-02 VITALS — BP 104/76 | Ht 61.5 in | Wt 152.0 lb

## 2013-08-02 DIAGNOSIS — Z3202 Encounter for pregnancy test, result negative: Secondary | ICD-10-CM

## 2013-08-02 DIAGNOSIS — Z309 Encounter for contraceptive management, unspecified: Secondary | ICD-10-CM

## 2013-08-02 DIAGNOSIS — Z30011 Encounter for initial prescription of contraceptive pills: Secondary | ICD-10-CM

## 2013-08-02 DIAGNOSIS — Z3046 Encounter for surveillance of implantable subdermal contraceptive: Secondary | ICD-10-CM

## 2013-08-02 HISTORY — DX: Encounter for surveillance of implantable subdermal contraceptive: Z30.46

## 2013-08-02 HISTORY — DX: Encounter for contraceptive management, unspecified: Z30.9

## 2013-08-02 LAB — POCT URINE PREGNANCY: Preg Test, Ur: NEGATIVE

## 2013-08-02 MED ORDER — NORETHIN ACE-ETH ESTRAD-FE 1-20 MG-MCG(24) PO TABS: 1.0000 | ORAL_TABLET | Freq: Every day | ORAL | Status: DC

## 2013-08-02 NOTE — Patient Instructions (Signed)
Use condoms x 4 weeks, keep clean and dry x 24 hours, no heavy lifting, keep steri strips on x 72 hours, Keep pressure dressing on x 24 hours. Follow up prn problems. Start OCs today use condoms Follow up in 3 months

## 2013-08-02 NOTE — Progress Notes (Signed)
Subjective:     Patient ID: Ashley Levy, female   DOB: 1985-06-01, 28 y.o.   MRN: [id number]  HPI Ashley LyonsCasey is a 28 year old white female in to get nexplanon removed, she says she is getting bigger in stomach and does not want weight gain, and wants the pill.  Review of Systems See HPI Reviewed past medical,surgical, social and family history. Reviewed medications and allergies.     Objective:   Physical Exam BP 104/76  Ht 5' 1.5" (1.562 m)  Wt 152 lb (68.947 kg)  BMI 28.26 kg/m2  LMP 06/18/2015UPT negative, verbal consent obtained, time out called, US showed nexplanon location, was not easily palpated, right arm cleansed with betadine, and injected with 1.5 cc 2% lidocaine and waited til numb.Under sterile technique a #11 blade was used to make small vertical incision, and a curved forceps was used to remove rod by Dr Emelda FearFerguson. Steri strips applied. Pressure dressing applied.    Assessment:     Nexplanon removal Contraceptive management    Plan:     Rx loestrin 24 fe Disp 1 pack take 1 daily with 11 refills, can start today Use condoms x 4 weeks, keep clean and dry x 24 hours, no heavy lifting, keep steri strips on x 72 hours, Keep pressure dressing on x 24 hours. Follow up prn problems.   Follow up in 3 months

## 2013-09-10 ENCOUNTER — Encounter: Payer: Self-pay | Admitting: Obstetrics & Gynecology

## 2013-09-10 ENCOUNTER — Ambulatory Visit (INDEPENDENT_AMBULATORY_CARE_PROVIDER_SITE_OTHER): Payer: BC Managed Care – PPO | Admitting: Obstetrics & Gynecology

## 2013-09-10 VITALS — BP 120/80 | Ht 61.5 in | Wt 152.0 lb

## 2013-09-10 DIAGNOSIS — Z3201 Encounter for pregnancy test, result positive: Secondary | ICD-10-CM

## 2013-09-10 LAB — POCT URINE PREGNANCY: Preg Test, Ur: POSITIVE

## 2013-09-10 NOTE — Progress Notes (Signed)
Patient ID: Ashley HarmsCasey P Levy, female   DOB: Jun 18, 1985, 28 y.o.   MRN: [id number] Pt here for pregnancy test, resulted positive. QHCG done today per pt request due to unsure of LMP. Pt to call office back Monday for results and to schedule New OB appt.

## 2013-09-11 LAB — HCG, QUANTITATIVE, PREGNANCY: HCG, BETA CHAIN, QUANT, S: 377 m[IU]/mL

## 2013-09-13 ENCOUNTER — Telehealth: Payer: Self-pay | Admitting: Adult Health

## 2013-09-13 NOTE — Telephone Encounter (Signed)
Pt informed of QHCG from 09/10/2013 377.0 call transferred to front staff for new ob in 5 weeks.

## 2013-10-15 ENCOUNTER — Other Ambulatory Visit: Payer: Self-pay | Admitting: Obstetrics and Gynecology

## 2013-10-15 DIAGNOSIS — O3680X0 Pregnancy with inconclusive fetal viability, not applicable or unspecified: Secondary | ICD-10-CM

## 2013-10-19 ENCOUNTER — Ambulatory Visit (INDEPENDENT_AMBULATORY_CARE_PROVIDER_SITE_OTHER): Payer: BC Managed Care – PPO

## 2013-10-19 DIAGNOSIS — O3680X0 Pregnancy with inconclusive fetal viability, not applicable or unspecified: Secondary | ICD-10-CM

## 2013-10-19 NOTE — Progress Notes (Signed)
U/S-single IUP with +FCA noted,  FHR-171 bpm, CRL c/w 9+6wks EDD 05/18/2014, cx appears closed, bilateral adnexa appears WNL

## 2013-10-25 ENCOUNTER — Encounter: Payer: BC Managed Care – PPO | Admitting: Women's Health

## 2013-11-01 ENCOUNTER — Encounter: Payer: Self-pay | Admitting: Women's Health

## 2013-11-01 ENCOUNTER — Ambulatory Visit (INDEPENDENT_AMBULATORY_CARE_PROVIDER_SITE_OTHER): Payer: BC Managed Care – PPO | Admitting: Women's Health

## 2013-11-01 VITALS — BP 112/64 | Wt 157.0 lb

## 2013-11-01 DIAGNOSIS — Z331 Pregnant state, incidental: Secondary | ICD-10-CM

## 2013-11-01 DIAGNOSIS — F17200 Nicotine dependence, unspecified, uncomplicated: Secondary | ICD-10-CM

## 2013-11-01 DIAGNOSIS — Z3400 Encounter for supervision of normal first pregnancy, unspecified trimester: Secondary | ICD-10-CM | POA: Insufficient documentation

## 2013-11-01 DIAGNOSIS — Z1389 Encounter for screening for other disorder: Secondary | ICD-10-CM

## 2013-11-01 DIAGNOSIS — Z0189 Encounter for other specified special examinations: Secondary | ICD-10-CM

## 2013-11-01 DIAGNOSIS — R768 Other specified abnormal immunological findings in serum: Secondary | ICD-10-CM

## 2013-11-01 DIAGNOSIS — Z0184 Encounter for antibody response examination: Secondary | ICD-10-CM

## 2013-11-01 DIAGNOSIS — Z1371 Encounter for nonprocreative screening for genetic disease carrier status: Secondary | ICD-10-CM

## 2013-11-01 DIAGNOSIS — Z3401 Encounter for supervision of normal first pregnancy, first trimester: Secondary | ICD-10-CM

## 2013-11-01 DIAGNOSIS — Z1159 Encounter for screening for other viral diseases: Secondary | ICD-10-CM

## 2013-11-01 DIAGNOSIS — Z36 Encounter for antenatal screening of mother: Secondary | ICD-10-CM

## 2013-11-01 NOTE — Patient Instructions (Addendum)
Nausea & Vomiting  Have saltine crackers or pretzels by your bed and eat a few bites before you raise your head out of bed in the morning  Eat small frequent meals throughout the day instead of large meals  Drink plenty of fluids throughout the day to stay hydrated, just don't drink a lot of fluids with your meals.  This can make your stomach fill up faster making you feel sick  Do not brush your teeth right after you eat  Products with real ginger are good for nausea, like ginger ale and ginger hard candy Make sure it says made with real ginger!  Sucking on sour candy like lemon heads is also good for nausea  If your prenatal vitamins make you nauseated, take them at night so you will sleep through the nausea  Sea Bands  If you feel like you need medicine for the nausea & vomiting please let us know  If you are unable to keep any fluids or food down please let us know    First Trimester of Pregnancy The first trimester of pregnancy is from week 1 until the end of week 12 (months 1 through 3). A week after a sperm fertilizes an egg, the egg will implant on the wall of the uterus. This embryo will begin to develop into a baby. Genes from you and your partner are forming the baby. The female genes determine whether the baby is a boy or a girl. At 6-8 weeks, the eyes and face are formed, and the heartbeat can be seen on ultrasound. At the end of 12 weeks, all the baby's organs are formed.  Now that you are pregnant, you will want to do everything you can to have a healthy baby. Two of the most important things are to get good prenatal care and to follow your health care provider's instructions. Prenatal care is all the medical care you receive before the baby's birth. This care will help prevent, find, and treat any problems during the pregnancy and childbirth. BODY CHANGES Your body goes through many changes during pregnancy. The changes vary from woman to woman.   You may gain or lose a  couple of pounds at first.  You may feel sick to your stomach (nauseous) and throw up (vomit). If the vomiting is uncontrollable, call your health care provider.  You may tire easily.  You may develop headaches that can be relieved by medicines approved by your health care provider.  You may urinate more often. Painful urination may mean you have a bladder infection.  You may develop heartburn as a result of your pregnancy.  You may develop constipation because certain hormones are causing the muscles that push waste through your intestines to slow down.  You may develop hemorrhoids or swollen, bulging veins (varicose veins).  Your breasts may begin to grow larger and become tender. Your nipples may stick out more, and the tissue that surrounds them (areola) may become darker.  Your gums may bleed and may be sensitive to brushing and flossing.  Dark spots or blotches (chloasma, mask of pregnancy) may develop on your face. This will likely fade after the baby is born.  Your menstrual periods will stop.  You may have a loss of appetite.  You may develop cravings for certain kinds of food.  You may have changes in your emotions from day to day, such as being excited to be pregnant or being concerned that something may go wrong with the pregnancy and   You may have more vivid and strange dreams.  You may have changes in your hair. These can include thickening of your hair, rapid growth, and changes in texture. Some women also have hair loss during or after pregnancy, or hair that feels dry or thin. Your hair will most likely return to normal after your baby is born. WHAT TO EXPECT AT YOUR PRENATAL VISITS During a routine prenatal visit:  You will be weighed to make sure you and the baby are growing normally.  Your blood pressure will be taken.  Your abdomen will be measured to track your baby's growth.  The fetal heartbeat will be listened to starting around week 10 or 12  of your pregnancy.  Test results from any previous visits will be discussed. Your health care provider may ask you:  How you are feeling.  If you are feeling the baby move.  If you have had any abnormal symptoms, such as leaking fluid, bleeding, severe headaches, or abdominal cramping.  If you have any questions. Other tests that may be performed during your first trimester include:  Blood tests to find your blood type and to check for the presence of any previous infections. They will also be used to check for low iron levels (anemia) and Rh antibodies. Later in the pregnancy, blood tests for diabetes will be done along with other tests if problems develop.  Urine tests to check for infections, diabetes, or protein in the urine.  An ultrasound to confirm the proper growth and development of the baby.  An amniocentesis to check for possible genetic problems.  Fetal screens for spina bifida and Down syndrome.  You may need other tests to make sure you and the baby are doing well. HOME CARE INSTRUCTIONS  Medicines  Follow your health care provider's instructions regarding medicine use. Specific medicines may be either safe or unsafe to take during pregnancy.  Take your prenatal vitamins as directed.  If you develop constipation, try taking a stool softener if your health care provider approves. Diet  Eat regular, well-balanced meals. Choose a variety of foods, such as meat or vegetable-based protein, fish, milk and low-fat dairy products, vegetables, fruits, and whole grain breads and cereals. Your health care provider will help you determine the amount of weight gain that is right for you.  Avoid raw meat and uncooked cheese. These carry germs that can cause birth defects in the baby.  Eating four or five small meals rather than three large meals a day may help relieve nausea and vomiting. If you start to feel nauseous, eating a few soda crackers can be helpful. Drinking liquids  between meals instead of during meals also seems to help nausea and vomiting.  If you develop constipation, eat more high-fiber foods, such as fresh vegetables or fruit and whole grains. Drink enough fluids to keep your urine clear or pale yellow. Activity and Exercise  Exercise only as directed by your health care provider. Exercising will help you:  Control your weight.  Stay in shape.  Be prepared for labor and delivery.  Experiencing pain or cramping in the lower abdomen or low back is a good sign that you should stop exercising. Check with your health care provider before continuing normal exercises.  Try to avoid standing for long periods of time. Move your legs often if you must stand in one place for a long time.  Avoid heavy lifting.  Wear low-heeled shoes, and practice good posture.  You may continue to have   to have sex unless your health care provider directs you otherwise. Relief of Pain or Discomfort  Wear a good support bra for breast tenderness.   Take warm sitz baths to soothe any pain or discomfort caused by hemorrhoids. Use hemorrhoid cream if your health care provider approves.   Rest with your legs elevated if you have leg cramps or low back pain.  If you develop varicose veins in your legs, wear support hose. Elevate your feet for 15 minutes, 3-4 times a day. Limit salt in your diet. Prenatal Care  Schedule your prenatal visits by the twelfth week of pregnancy. They are usually scheduled monthly at first, then more often in the last 2 months before delivery.  Write down your questions. Take them to your prenatal visits.  Keep all your prenatal visits as directed by your health care provider. Safety  Wear your seat belt at all times when driving.  Make a list of emergency phone numbers, including numbers for family, friends, the hospital, and police and fire departments. General Tips  Ask your health care provider for a referral to a local prenatal education  class. Begin classes no later than at the beginning of month 6 of your pregnancy.  Ask for help if you have counseling or nutritional needs during pregnancy. Your health care provider can offer advice or refer you to specialists for help with various needs.  Do not use hot tubs, steam rooms, or saunas.  Do not douche or use tampons or scented sanitary pads.  Do not cross your legs for long periods of time.  Avoid cat litter boxes and soil used by cats. These carry germs that can cause birth defects in the baby and possibly loss of the fetus by miscarriage or stillbirth.  Avoid all smoking, herbs, alcohol, and medicines not prescribed by your health care provider. Chemicals in these affect the formation and growth of the baby.  Schedule a dentist appointment. At home, brush your teeth with a soft toothbrush and be gentle when you floss. SEEK MEDICAL CARE IF:   You have dizziness.  You have mild pelvic cramps, pelvic pressure, or nagging pain in the abdominal area.  You have persistent nausea, vomiting, or diarrhea.  You have a bad smelling vaginal discharge.  You have pain with urination.  You notice increased swelling in your face, hands, legs, or ankles. SEEK IMMEDIATE MEDICAL CARE IF:   You have a fever.  You are leaking fluid from your vagina.  You have spotting or bleeding from your vagina.  You have severe abdominal cramping or pain.  You have rapid weight gain or loss.  You vomit blood or material that looks like coffee grounds.  You are exposed to [place] measles and have never had them.  You are exposed to fifth disease or chickenpox.  You develop a severe headache.  You have shortness of breath.  You have any kind of trauma, such as from a fall or a car accident. Document Released: 01/22/2001 Document Revised: 06/14/2013 Document Reviewed: 12/08/2012 [facility] Patient Information 2015 [place], [place]. This information is not intended to replace advice given  to you by your health care provider. Make sure you discuss any questions you have with your health care provider.  Pregnancy and Smoking Smoking during pregnancy is unhealthy for you and your developing baby. The addictive drug nicotine, carbon monoxide, and many other poisons are inhaled from a cigarette and carried through your bloodstream to your baby. Cigarette smoke contains more than 2,500 chemicals.  It is not known which of these are harmful to a developing baby. However, both nicotine and carbon monoxide play a role in causing health problems in pregnancy. Smoking during pregnancy increases the risk of:  Birth defects in your baby, including heart defects.  Miscarriage and stillbirth.  Birth before 37 completed weeks of pregnancy (premature birth).  Pregnancy outside of the uterus (tubal pregnancy).  Attachment of the placenta over the opening of the uterus (placenta previa).  Detachment of the placenta before the baby's birth (placental abruption).  Breaking of the bag of waters before labor begins (premature rupture of membranes). HOW DOES SMOKING DURING PREGNANCY AFFECT MY BABY? Before Birth Smoking during pregnancy:  Decreases blood flow and oxygen to your baby.  Increases the heart rate of your baby.  Slows your baby's growth in the uterus (intrauterine growth retardation). After Birth Babies born to women who smoke during pregnancy are more likely to have a low birth weight. They are also at risk for:  Serious health problems, chronic or lifelong disabilities (cerebral palsy, mental retardation, learning problems), and death.  Sudden infant death syndrome (SIDS).  Lung and breathing problems. WHAT RESOURCES ARE AVAILABLE TO HELP ME STOP SMOKING?  Ask your health care provider for help to stop smoking. The following resources are available:  Counseling.  Psychological treatment.  Acupuncture.  Family intervention.  Hypnosis.  Nicotine supplements have not  been studied enough to know if they are safe to use during pregnancy. They should only be considered when all other methods fail, and if used under the close supervision of your health care provider.  Telephone QUIT lines. The national smoking cessation telephone hotline number is 1-800-QUIT NOW. FOR MORE INFORMATION  American Cancer Society: www.cancer.org  American Heart Association: www.heart.org  National Cancer Institute: www.cancer.gov  March of Dimes: www.marchofdimes.org Document Released: 06/11/2004 Document Revised: 02/02/2013 Document Reviewed: 12/28/2012 [facility] Patient Information 2015 [place], [place]. This information is not intended to replace advice given to you by your health care provider. Make sure you discuss any questions you have with your health care provider.

## 2013-11-01 NOTE — Progress Notes (Addendum)
  Subjective:  Ashley Levy is a 28 y.o. G1P0 Caucasian female at [redacted]w[redacted]d by 9wk u/s being seen today for her first obstetrical visit.  Her obstetrical history is significant for primigravida, smoker: 1/2-1ppd.  Pregnancy history fully reviewed.  Patient reports no complaints. Denies vb, cramping, uti s/s, abnormal/malodorous vag d/c, or vulvovaginal itching/irritation. Was married, husband cheated on her and they are no longer together, but now she is unable to get the food stamps she needs. Has some food, but feels like she doesn't have what she needs. Gave names of local churches who do food donations, and noted on ccnc form.   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   BP 112/64  Wt 157 lb (71.215 kg)  LMP 07/29/2013  HISTORY: OB History  Gravida Para Term Preterm AB SAB TAB Ectopic Multiple Living  1             # Outcome Date GA Lbr Len/2nd Weight Sex Delivery Anes PTL Lv  1 CUR              Past Medical History  Diagnosis Date  . Polycystic ovarian syndrome   . Encounter for Nexplanon removal 08/02/2013  . Contraceptive management 08/02/2013   Past Surgical History  Procedure Laterality Date  . No past surgeries     Family History  Problem Relation Age of Onset  . Asthma Mother   . Cancer Paternal Grandfather   . Psoriasis Maternal Grandmother   . Asthma Maternal Grandfather     Exam   System:     General: Well developed & nourished, no acute distress   Skin: Warm & dry, normal coloration and turgor, no rashes   Neurologic: Alert & oriented, normal mood   Cardiovascular: Regular rate & rhythm   Respiratory: Effort & rate normal, LCTAB, acyanotic   Abdomen: Soft, non tender   Extremities: normal strength, tone   Thin prep pap smear neg 12/2012  FHR: 163 via doppler   Assessment:   Pregnancy: G1P0 Patient Active Problem List   Diagnosis Date Noted  . Supervision of normal first pregnancy 11/01/2013    Priority: High  . Seropositive for herpes simplex  2 infection 01/06/2013  . Nexplanon insertion 01/04/2013    [redacted]w[redacted]d G1P0 New OB visit Smoker Known HSV 2, no outbreaks    Plan:  Initial labs drawn Continue prenatal vitamins Problem list reviewed and updated Reviewed n/v relief measures and warning s/s to report Reviewed recommended weight gain based on pre-gravid BMI Encouraged well-balanced diet Genetic Screening discussed Integrated Screen: requested Cystic fibrosis screening discussed declined Ultrasound discussed; fetal survey: requested Follow up in 1 weeks for 1st it/nt (no visit), then 4wks for 2nd IT and visit [facility] completed [facility] offered, accepted, referral faxed  Smokes 0.5-1pp/day, advised cessation, discussed risks to fetus while pregnant, to infant pp, and to herself. Offered QuitlineNC, accepted, referral sent.    Suppress HSV2 @ 34wks  Marge Duncans CNM, [facility] 11/01/2013 4:23 PM

## 2013-11-02 ENCOUNTER — Encounter: Payer: Self-pay | Admitting: Women's Health

## 2013-11-02 ENCOUNTER — Ambulatory Visit: Payer: BC Managed Care – PPO | Admitting: Adult Health

## 2013-11-02 LAB — CBC
HCT: 39.5 % (ref 36.0–46.0)
HEMOGLOBIN: 13.6 g/dL (ref 12.0–15.0)
MCH: 31 pg (ref 26.0–34.0)
MCHC: 34.4 g/dL (ref 30.0–36.0)
MCV: 90 fL (ref 78.0–100.0)
Platelets: 168 10*3/uL (ref 150–400)
RBC: 4.39 MIL/uL (ref 3.87–5.11)
RDW: 13.8 % (ref 11.5–15.5)
WBC: 10.4 10*3/uL (ref 4.0–10.5)

## 2013-11-02 LAB — URINALYSIS, ROUTINE W REFLEX MICROSCOPIC
BILIRUBIN URINE: NEGATIVE
Glucose, UA: NEGATIVE mg/dL
Hgb urine dipstick: NEGATIVE
KETONES UR: NEGATIVE mg/dL
Leukocytes, UA: NEGATIVE
Nitrite: NEGATIVE
PROTEIN: NEGATIVE mg/dL
Specific Gravity, Urine: 1.019 (ref 1.005–1.030)
UROBILINOGEN UA: 1 mg/dL (ref 0.0–1.0)
pH: 7.5 (ref 5.0–8.0)

## 2013-11-02 LAB — DRUG SCREEN, URINE, NO CONFIRMATION
Amphetamine Screen, Ur: NEGATIVE
Barbiturate Quant, Ur: NEGATIVE
Benzodiazepines.: NEGATIVE
CREATININE, U: 85.6 mg/dL
Cocaine Metabolites: NEGATIVE
METHADONE: NEGATIVE
Marijuana Metabolite: NEGATIVE
OPIATE SCREEN, URINE: NEGATIVE
PROPOXYPHENE: NEGATIVE
Phencyclidine (PCP): NEGATIVE

## 2013-11-02 LAB — GC/CHLAMYDIA PROBE AMP
CT PROBE, AMP APTIMA: NEGATIVE
GC Probe RNA: NEGATIVE

## 2013-11-02 LAB — HEPATITIS B SURFACE ANTIGEN: HEP B S AG: NEGATIVE

## 2013-11-02 LAB — HIV ANTIBODY (ROUTINE TESTING W REFLEX): HIV: NONREACTIVE

## 2013-11-02 LAB — ABO AND RH: RH TYPE: POSITIVE

## 2013-11-02 LAB — VARICELLA ZOSTER ANTIBODY, IGG: VARICELLA IGG: 1197 {index} — AB (ref <135.00)

## 2013-11-02 LAB — ANTIBODY SCREEN: Antibody Screen: NEGATIVE

## 2013-11-02 LAB — RUBELLA SCREEN: RUBELLA: 1.01 {index} — AB (ref <0.90)

## 2013-11-02 LAB — RPR

## 2013-11-02 LAB — OXYCODONE SCREEN, UA, RFLX CONFIRM: Oxycodone Screen, Ur: NEGATIVE ng/mL

## 2013-11-03 LAB — URINE CULTURE: Colony Count: 30000

## 2013-11-08 ENCOUNTER — Ambulatory Visit (INDEPENDENT_AMBULATORY_CARE_PROVIDER_SITE_OTHER): Payer: BC Managed Care – PPO

## 2013-11-08 ENCOUNTER — Other Ambulatory Visit: Payer: BC Managed Care – PPO

## 2013-11-08 DIAGNOSIS — Z36 Encounter for antenatal screening of mother: Secondary | ICD-10-CM

## 2013-11-08 NOTE — Progress Notes (Signed)
U/S(12+5wks)-single active fetus, CRL c/w dates, cx appear closed, anterior Gr 0 placenta, bilateral adnexa appears WNL, FHR-169 bpm, NB present, NT-1.35mm

## 2013-11-12 LAB — MATERNAL SCREEN, INTEGRATED #1

## 2013-11-29 ENCOUNTER — Encounter: Payer: Self-pay | Admitting: Women's Health

## 2013-11-29 ENCOUNTER — Ambulatory Visit (INDEPENDENT_AMBULATORY_CARE_PROVIDER_SITE_OTHER): Payer: BC Managed Care – PPO | Admitting: Women's Health

## 2013-11-29 VITALS — BP 120/64 | Wt 155.0 lb

## 2013-11-29 DIAGNOSIS — Z1389 Encounter for screening for other disorder: Secondary | ICD-10-CM

## 2013-11-29 DIAGNOSIS — Z3402 Encounter for supervision of normal first pregnancy, second trimester: Secondary | ICD-10-CM

## 2013-11-29 DIAGNOSIS — Z3682 Encounter for antenatal screening for nuchal translucency: Secondary | ICD-10-CM

## 2013-11-29 DIAGNOSIS — Z23 Encounter for immunization: Secondary | ICD-10-CM

## 2013-11-29 DIAGNOSIS — Z331 Pregnant state, incidental: Secondary | ICD-10-CM

## 2013-11-29 DIAGNOSIS — Z363 Encounter for antenatal screening for malformations: Secondary | ICD-10-CM

## 2013-11-29 LAB — POCT URINALYSIS DIPSTICK
Glucose, UA: NEGATIVE
KETONES UA: NEGATIVE
Leukocytes, UA: NEGATIVE
NITRITE UA: NEGATIVE
Protein, UA: NEGATIVE
RBC UA: NEGATIVE

## 2013-11-29 NOTE — Patient Instructions (Signed)
Second Trimester of Pregnancy The second trimester is from week 13 through week 28, months 4 through 6. The second trimester is often a time when you feel your best. Your body has also adjusted to being pregnant, and you begin to feel better physically. Usually, morning sickness has lessened or quit completely, you may have more energy, and you may have an increase in appetite. The second trimester is also a time when the fetus is growing rapidly. At the end of the sixth month, the fetus is about 9 inches long and weighs about 1 pounds. You will likely begin to feel the baby move (quickening) between 18 and 20 weeks of the pregnancy. BODY CHANGES Your body goes through many changes during pregnancy. The changes vary from woman to woman.   Your weight will continue to increase. You will notice your lower abdomen bulging out.  You may begin to get stretch marks on your hips, abdomen, and breasts.  You may develop headaches that can be relieved by medicines approved by your health care provider.  You may urinate more often because the fetus is pressing on your bladder.  You may develop or continue to have heartburn as a result of your pregnancy.  You may develop constipation because certain hormones are causing the muscles that push waste through your intestines to slow down.  You may develop hemorrhoids or swollen, bulging veins (varicose veins).  You may have back pain because of the weight gain and pregnancy hormones relaxing your joints between the bones in your pelvis and as a result of a shift in weight and the muscles that support your balance.  Your breasts will continue to grow and be tender.  Your gums may bleed and may be sensitive to brushing and flossing.  Dark spots or blotches (chloasma, mask of pregnancy) may develop on your face. This will likely fade after the baby is born.  A dark line from your belly button to the pubic area (linea nigra) may appear. This will likely fade  after the baby is born.  You may have changes in your hair. These can include thickening of your hair, rapid growth, and changes in texture. Some women also have hair loss during or after pregnancy, or hair that feels dry or thin. Your hair will most likely return to normal after your baby is born. WHAT TO EXPECT AT YOUR PRENATAL VISITS During a routine prenatal visit:  You will be weighed to make sure you and the fetus are growing normally.  Your blood pressure will be taken.  Your abdomen will be measured to track your baby's growth.  The fetal heartbeat will be listened to.  Any test results from the previous visit will be discussed. Your health care provider may ask you:  How you are feeling.  If you are feeling the baby move.  If you have had any abnormal symptoms, such as leaking fluid, bleeding, severe headaches, or abdominal cramping.  If you have any questions. Other tests that may be performed during your second trimester include:  Blood tests that check for:  Low iron levels (anemia).  Gestational diabetes (between 24 and 28 weeks).  Rh antibodies.  Urine tests to check for infections, diabetes, or protein in the urine.  An ultrasound to confirm the proper growth and development of the baby.  An amniocentesis to check for possible genetic problems.  Fetal screens for spina bifida and Down syndrome. HOME CARE INSTRUCTIONS   Avoid all smoking, herbs, alcohol, and unprescribed  drugs. These chemicals affect the formation and growth of the baby.  Follow your health care provider's instructions regarding medicine use. There are medicines that are either safe or unsafe to take during pregnancy.  Exercise only as directed by your health care provider. Experiencing uterine cramps is a good sign to stop exercising.  Continue to eat regular, healthy meals.  Wear a good support bra for breast tenderness.  Do not use hot tubs, steam rooms, or saunas.  Wear your  seat belt at all times when driving.  Avoid raw meat, uncooked cheese, cat litter boxes, and soil used by cats. These carry germs that can cause birth defects in the baby.  Take your prenatal vitamins.  Try taking a stool softener (if your health care provider approves) if you develop constipation. Eat more high-fiber foods, such as fresh vegetables or fruit and whole grains. Drink plenty of fluids to keep your urine clear or pale yellow.  Take warm sitz baths to soothe any pain or discomfort caused by hemorrhoids. Use hemorrhoid cream if your health care provider approves.  If you develop varicose veins, wear support hose. Elevate your feet for 15 minutes, 3-4 times a day. Limit salt in your diet.  Avoid heavy lifting, wear low heel shoes, and practice good posture.  Rest with your legs elevated if you have leg cramps or low back pain.  Visit your dentist if you have not gone yet during your pregnancy. Use a soft toothbrush to brush your teeth and be gentle when you floss.  A sexual relationship may be continued unless your health care provider directs you otherwise.  Continue to go to all your prenatal visits as directed by your health care provider. SEEK MEDICAL CARE IF:   You have dizziness.  You have mild pelvic cramps, pelvic pressure, or nagging pain in the abdominal area.  You have persistent nausea, vomiting, or diarrhea.  You have a bad smelling vaginal discharge.  You have pain with urination. SEEK IMMEDIATE MEDICAL CARE IF:   You have a fever.  You are leaking fluid from your vagina.  You have spotting or bleeding from your vagina.  You have severe abdominal cramping or pain.  You have rapid weight gain or loss.  You have shortness of breath with chest pain.  You notice sudden or extreme swelling of your face, hands, ankles, feet, or legs.  You have not felt your baby move in over an hour.  You have severe headaches that do not go away with  medicine.  You have vision changes. Document Released: 01/22/2001 Document Revised: 02/02/2013 Document Reviewed: 03/31/2012 [facility] Patient Information 2015 [place], [place]. This information is not intended to replace advice given to you by your health care provider. Make sure you discuss any questions you have with your health care provider.  Stress and Stress Management Stress is a normal reaction to life events. It is what you feel when life demands more than you are used to or more than you can handle. Some stress can be useful. For example, the stress reaction can help you catch the last bus of the day, study for a test, or meet a deadline at work. But stress that occurs too often or for too long can cause problems. It can affect your emotional health and interfere with relationships and normal daily activities. Too much stress can weaken your immune system and increase your risk for physical illness. If you already have a medical problem, stress can make it worse. CAUSES  All sorts of life events may cause stress. An event that causes stress for one person may not be stressful for another person. Major life events commonly cause stress. These may be positive or negative. Examples include losing your job, moving into a new home, getting married, having a baby, or losing a loved one. Less obvious life events may also cause stress, especially if they occur day after day or in combination. Examples include working long hours, driving in traffic, caring for children, being in debt, or being in a difficult relationship. SIGNS AND SYMPTOMS Stress may cause emotional symptoms including, the following:  Anxiety. This is feeling worried, afraid, on edge, overwhelmed, or out of control.  Anger. This is feeling irritated or impatient.  Depression. This is feeling sad, down, helpless, or guilty.  Difficulty focusing, remembering, or making decisions. Stress may cause physical symptoms, including the  following:   Aches and pains. These may affect your head, neck, back, stomach, or other areas of your body.  Tight muscles or clenched jaw.  Low energy or trouble sleeping. Stress may cause unhealthy behaviors, including the following:   Eating to feel better (overeating) or skipping meals.  Sleeping too little, too much, or both.  Working too much or putting off tasks (procrastination).  Smoking, drinking alcohol, or using drugs to feel better. DIAGNOSIS  Stress is diagnosed through an assessment by your health care provider. Your health care provider will ask questions about your symptoms and any stressful life events.Your health care provider will also ask about your medical history and may order blood tests or other tests. Certain medical conditions and medicine can cause physical symptoms similar to stress. Mental illness can cause emotional symptoms and unhealthy behaviors similar to stress. Your health care provider may refer you to a mental health professional for further evaluation.  TREATMENT  Stress management is the recommended treatment for stress.The goals of stress management are reducing stressful life events and coping with stress in healthy ways.  Techniques for reducing stressful life events include the following:  Stress identification. Self-monitor for stress and identify what causes stress for you. These skills may help you to avoid some stressful events.  Time management. Set your priorities, keep a calendar of events, and learn to say "no." These tools can help you avoid making too many commitments. Techniques for coping with stress include the following:  Rethinking the problem. Try to think realistically about stressful events rather than ignoring them or overreacting. Try to find the positives in a stressful situation rather than focusing on the negatives.  Exercise. Physical exercise can release both physical and emotional tension. The key is to find a  form of exercise you enjoy and do it regularly.  Relaxation techniques. These relax the body and mind. Examples include yoga, meditation, tai chi, biofeedback, deep breathing, progressive muscle relaxation, listening to music, being out in nature, journaling, and other hobbies. Again, the key is to find one or more that you enjoy and can do regularly.  Healthy lifestyle. Eat a balanced diet, get plenty of sleep, and do not smoke. Avoid using alcohol or drugs to relax.  Strong support network. Spend time with family, friends, or other people you enjoy being around.Express your feelings and talk things over with someone you trust. Counseling or talktherapy with a mental health professional may be helpful if you are having difficulty managing stress on your own. Medicine is typically not recommended for the treatment of stress.Talk to your health care provider if you  think you need medicine for symptoms of stress. HOME CARE INSTRUCTIONS  Keep all follow-up visits as directed by your health care provider.  Take all medicines as directed by your health care provider. SEEK MEDICAL CARE IF:  Your symptoms get worse or you start having new symptoms.  You feel overwhelmed by your problems and can no longer manage them on your own. SEEK IMMEDIATE MEDICAL CARE IF:  You feel like hurting yourself or someone else. Document Released: 07/24/2000 Document Revised: 06/14/2013 Document Reviewed: 09/22/2012 [facility] Patient Information 2015 [place], [place]. This information is not intended to replace advice given to you by your health care provider. Make sure you discuss any questions you have with your health care provider.

## 2013-11-29 NOTE — Progress Notes (Signed)
Low-risk OB appointment G1P0 [id number] Estimated Date of Delivery: 05/18/14 BP 120/64  Wt 155 lb (70.308 kg)  LMP 07/29/2013  BP, weight, and urine reviewed.  Refer to obstetrical flow sheet for FH & FHR.  No fm yet. Denies cramping, lof, vb, or uti s/s. Stressed, husband who she is no longer w/ has brought charges against her, so she has had to go to court. Discussed stress relief activities. Some LBP, discussed relief measures.  Reviewed warning s/s to report. Plan:  Continue routine obstetrical care  F/U in 4wks for OB appointment and anatomy u/s Flu shot and 2nd IT today

## 2013-12-02 LAB — MATERNAL SCREEN, INTEGRATED #2
AFP MoM: 1.88
AFP, SERUM MAT SCREEN: 58.7 ng/mL
CALCULATED GESTATIONAL AGE MAT SCREEN: 15.9
Crown Rump Length: 67 mm
ESTRIOL FREE MAT SCREEN: 0.83 ng/mL
Estriol Mom: 1.07
INHIBIN A MOM MAT SCREEN: 1.79
Inhibin A Dimeric: 304 pg/mL
MSS Trisomy 18 Risk: <1:5000 {titer}
NT MOM MAT SCREEN: 1.2
NUCHAL TRANSLUCENCY MAT SCREEN 2: 1.8 mm
NUMBER OF FETUSES MAT SCREEN 2: 1
PAPP-A MAT SCREEN: 777 ng/mL
PAPP-A MoM: 1.11
Rish for ONTD: 1:580 {titer}
hCG MoM: 1.02
hCG, Serum: 36.1 IU/mL

## 2013-12-06 ENCOUNTER — Encounter: Payer: Self-pay | Admitting: Women's Health

## 2013-12-13 ENCOUNTER — Encounter: Payer: Self-pay | Admitting: Women's Health

## 2013-12-27 ENCOUNTER — Ambulatory Visit (INDEPENDENT_AMBULATORY_CARE_PROVIDER_SITE_OTHER): Payer: BC Managed Care – PPO

## 2013-12-27 ENCOUNTER — Encounter: Payer: Self-pay | Admitting: Women's Health

## 2013-12-27 ENCOUNTER — Ambulatory Visit (INDEPENDENT_AMBULATORY_CARE_PROVIDER_SITE_OTHER): Payer: BC Managed Care – PPO | Admitting: Women's Health

## 2013-12-27 VITALS — BP 114/64 | Wt 161.0 lb

## 2013-12-27 DIAGNOSIS — Z3402 Encounter for supervision of normal first pregnancy, second trimester: Secondary | ICD-10-CM

## 2013-12-27 DIAGNOSIS — Z36 Encounter for antenatal screening of mother: Secondary | ICD-10-CM

## 2013-12-27 DIAGNOSIS — Z363 Encounter for antenatal screening for malformations: Secondary | ICD-10-CM

## 2013-12-27 DIAGNOSIS — Z331 Pregnant state, incidental: Secondary | ICD-10-CM

## 2013-12-27 DIAGNOSIS — Z1389 Encounter for screening for other disorder: Secondary | ICD-10-CM

## 2013-12-27 LAB — POCT URINALYSIS DIPSTICK
Blood, UA: NEGATIVE
Glucose, UA: NEGATIVE
Ketones, UA: NEGATIVE
LEUKOCYTES UA: NEGATIVE
NITRITE UA: NEGATIVE
PROTEIN UA: NEGATIVE

## 2013-12-27 NOTE — Progress Notes (Signed)
U/S(19+5wks)-active fetus, meas c/w dates, fluid wnl, anterior Gr 0 placenta,cx appears closed (3.1cm), bilateral adnexa appears WNL, FHR- 136 bpm, no major abnl noted, female fetus

## 2013-12-27 NOTE — Progress Notes (Signed)
Low-risk OB appointment G1P0 [id number] Estimated Date of Delivery: 05/18/14 BP 114/64 mmHg  Wt 161 lb (73.029 kg)  LMP 07/29/2013  BP, weight, and urine reviewed.  Refer to obstetrical flow sheet for FH & FHR.  Reports good fm.  Denies regular uc's, lof, vb, or uti s/s. Some LBP- discussed relief measures Reviewed today's normal anatomy u/s, warning s/s to report. Plan:  Continue routine obstetrical care  F/U in 4wks for OB appointment

## 2013-12-27 NOTE — Patient Instructions (Signed)
For your lower back pain you may:  Purchase a pregnancy belt from [employer], [employer], [employer], etc and wear it while you are up and about  Take warm baths  Use a heating pad to your lower back for no longer than 20 minutes at a time, and do not place near abdomen  Take tylenol as needed. Please follow directions on the bottle  Second Trimester of Pregnancy The second trimester is from week 13 through week 28, months 4 through 6. The second trimester is often a time when you feel your best. Your body has also adjusted to being pregnant, and you begin to feel better physically. Usually, morning sickness has lessened or quit completely, you may have more energy, and you may have an increase in appetite. The second trimester is also a time when the fetus is growing rapidly. At the end of the sixth month, the fetus is about 9 inches long and weighs about 1 pounds. You will likely begin to feel the baby move (quickening) between 18 and 20 weeks of the pregnancy. BODY CHANGES Your body goes through many changes during pregnancy. The changes vary from woman to woman.   Your weight will continue to increase. You will notice your lower abdomen bulging out.  You may begin to get stretch marks on your hips, abdomen, and breasts.  You may develop headaches that can be relieved by medicines approved by your health care provider.  You may urinate more often because the fetus is pressing on your bladder.  You may develop or continue to have heartburn as a result of your pregnancy.  You may develop constipation because certain hormones are causing the muscles that push waste through your intestines to slow down.  You may develop hemorrhoids or swollen, bulging veins (varicose veins).  You may have back pain because of the weight gain and pregnancy hormones relaxing your joints between the bones in your pelvis and as a result of a shift in weight and the muscles that support your  balance.  Your breasts will continue to grow and be tender.  Your gums may bleed and may be sensitive to brushing and flossing.  Dark spots or blotches (chloasma, mask of pregnancy) may develop on your face. This will likely fade after the baby is born.  A dark line from your belly button to the pubic area (linea nigra) may appear. This will likely fade after the baby is born.  You may have changes in your hair. These can include thickening of your hair, rapid growth, and changes in texture. Some women also have hair loss during or after pregnancy, or hair that feels dry or thin. Your hair will most likely return to normal after your baby is born. WHAT TO EXPECT AT YOUR PRENATAL VISITS During a routine prenatal visit:  You will be weighed to make sure you and the fetus are growing normally.  Your blood pressure will be taken.  Your abdomen will be measured to track your baby's growth.  The fetal heartbeat will be listened to.  Any test results from the previous visit will be discussed. Your health care provider may ask you:  How you are feeling.  If you are feeling the baby move.  If you have had any abnormal symptoms, such as leaking fluid, bleeding, severe headaches, or abdominal cramping.  If you have any questions. Other tests that may be performed during your second trimester include:  Blood tests that check for:  Low iron   levels (anemia).  Gestational diabetes (between 24 and 28 weeks).  Rh antibodies.  Urine tests to check for infections, diabetes, or protein in the urine.  An ultrasound to confirm the proper growth and development of the baby.  An amniocentesis to check for possible genetic problems.  Fetal screens for spina bifida and Down syndrome. HOME CARE INSTRUCTIONS  5. Avoid all smoking, herbs, alcohol, and unprescribed drugs. These chemicals affect the formation and growth of the baby. 6. Follow your health care provider's instructions regarding  medicine use. There are medicines that are either safe or unsafe to take during pregnancy. 7. Exercise only as directed by your health care provider. Experiencing uterine cramps is a good sign to stop exercising. 8. Continue to eat regular, healthy meals. 9. Wear a good support bra for breast tenderness. 10. Do not use hot tubs, steam rooms, or saunas. 11. Wear your seat belt at all times when driving. 12. Avoid raw meat, uncooked cheese, cat litter boxes, and soil used by cats. These carry germs that can cause birth defects in the baby. 13. Take your prenatal vitamins. 14. Try taking a stool softener (if your health care provider approves) if you develop constipation. Eat more high-fiber foods, such as fresh vegetables or fruit and whole grains. Drink plenty of fluids to keep your urine clear or pale yellow. 15. Take warm sitz baths to soothe any pain or discomfort caused by hemorrhoids. Use hemorrhoid cream if your health care provider approves. 16. If you develop varicose veins, wear support hose. Elevate your feet for 15 minutes, 3-4 times a day. Limit salt in your diet. 17. Avoid heavy lifting, wear low heel shoes, and practice good posture. 18. Rest with your legs elevated if you have leg cramps or low back pain. 19. Visit your dentist if you have not gone yet during your pregnancy. Use a soft toothbrush to brush your teeth and be gentle when you floss. 20. A sexual relationship may be continued unless your health care provider directs you otherwise. 21. Continue to go to all your prenatal visits as directed by your health care provider. SEEK MEDICAL CARE IF:   You have dizziness.  You have mild pelvic cramps, pelvic pressure, or nagging pain in the abdominal area.  You have persistent nausea, vomiting, or diarrhea.  You have a bad smelling vaginal discharge.  You have pain with urination. SEEK IMMEDIATE MEDICAL CARE IF:   You have a fever.  You are leaking fluid from your  vagina.  You have spotting or bleeding from your vagina.  You have severe abdominal cramping or pain.  You have rapid weight gain or loss.  You have shortness of breath with chest pain.  You notice sudden or extreme swelling of your face, hands, ankles, feet, or legs.  You have not felt your baby move in over an hour.  You have severe headaches that do not go away with medicine.  You have vision changes. Document Released: 01/22/2001 Document Revised: 02/02/2013 Document Reviewed: 03/31/2012 [facility] Patient Information 2015 [place]. This information is not intended to replace advice given to you by your health care provider. Make sure you discuss any questions you have with your health care provider.

## 2014-01-24 ENCOUNTER — Ambulatory Visit (INDEPENDENT_AMBULATORY_CARE_PROVIDER_SITE_OTHER): Payer: BC Managed Care – PPO | Admitting: Women's Health

## 2014-01-24 ENCOUNTER — Encounter: Payer: Self-pay | Admitting: Women's Health

## 2014-01-24 VITALS — BP 120/68 | Wt 167.0 lb

## 2014-01-24 DIAGNOSIS — Z1389 Encounter for screening for other disorder: Secondary | ICD-10-CM

## 2014-01-24 DIAGNOSIS — Z3402 Encounter for supervision of normal first pregnancy, second trimester: Secondary | ICD-10-CM

## 2014-01-24 DIAGNOSIS — Z331 Pregnant state, incidental: Secondary | ICD-10-CM

## 2014-01-24 LAB — POCT URINALYSIS DIPSTICK
Blood, UA: NEGATIVE
Glucose, UA: NEGATIVE
Ketones, UA: NEGATIVE
LEUKOCYTES UA: NEGATIVE
NITRITE UA: NEGATIVE
PROTEIN UA: NEGATIVE

## 2014-01-24 NOTE — Progress Notes (Signed)
Low-risk OB appointment G1P0 [id number] Estimated Date of Delivery: 05/18/14 BP 120/68 mmHg  Wt 167 lb (75.751 kg)  LMP 07/29/2013  BP, weight, and urine reviewed.  Refer to obstetrical flow sheet for FH & FHR.  States hasn't felt baby move much entire pregnancy- has anterior placenta.  Denies regular uc's, lof, vb, or uti s/s. Low back pain- discussed relief measures- printed off lbp exercises for pregnancy.  Reviewed ptl s/s, fm. Plan:  Continue routine obstetrical care  F/U in 4wks for OB appointment and pn2

## 2014-01-24 NOTE — Patient Instructions (Signed)
You will have your sugar test next visit.  Please do not eat or drink anything after midnight the night before you come, not even water.  You will be here for at least two hours.     For your lower back pain you may:  Purchase a pregnancy belt from [employer], [employer], [employer], etc and wear it while you are up and about  Take warm baths  Use a heating pad to your lower back for no longer than 20 minutes at a time, and do not place near abdomen  Take tylenol as needed. Please follow directions on the bottle   Call the office [phone]) or [facility] if:  You begin to have strong, frequent contractions  Your water breaks.  Sometimes it is a big gush of fluid, sometimes it is just a trickle that keeps getting your panties wet or running down your legs  You have vaginal bleeding.  It is normal to have a small amount of spotting if your cervix was checked.   You don't feel your baby moving like normal.  If you don't, get you something to eat and drink and lay down and focus on feeling your baby move.  If your baby is still not moving like normal, you should call us or [facility]    Second Trimester of Pregnancy The second trimester is from week 13 through week 28, months 4 through 6. The second trimester is often a time when you feel your best. Your body has also adjusted to being pregnant, and you begin to feel better physically. Usually, morning sickness has lessened or quit completely, you may have more energy, and you may have an increase in appetite. The second trimester is also a time when the fetus is growing rapidly. At the end of the sixth month, the fetus is about 9 inches long and weighs about 1 pounds. You will likely begin to feel the baby move (quickening) between 18 and 20 weeks of the pregnancy. BODY CHANGES Your body goes through many changes during pregnancy. The changes vary from woman to woman.   Your weight will continue to increase.  You will notice your lower abdomen bulging out.  You may begin to get stretch marks on your hips, abdomen, and breasts.  You may develop headaches that can be relieved by medicines approved by your health care provider.  You may urinate more often because the fetus is pressing on your bladder.  You may develop or continue to have heartburn as a result of your pregnancy.  You may develop constipation because certain hormones are causing the muscles that push waste through your intestines to slow down.  You may develop hemorrhoids or swollen, bulging veins (varicose veins).  You may have back pain because of the weight gain and pregnancy hormones relaxing your joints between the bones in your pelvis and as a result of a shift in weight and the muscles that support your balance.  Your breasts will continue to grow and be tender.  Your gums may bleed and may be sensitive to brushing and flossing.  Dark spots or blotches (chloasma, mask of pregnancy) may develop on your face. This will likely fade after the baby is born.  A dark line from your belly button to the pubic area (linea nigra) may appear. This will likely fade after the baby is born.  You may have changes in your hair. These can include thickening of your hair, rapid  growth, and changes in texture. Some women also have hair loss during or after pregnancy, or hair that feels dry or thin. Your hair will most likely return to normal after your baby is born. WHAT TO EXPECT AT YOUR PRENATAL VISITS During a routine prenatal visit:  You will be weighed to make sure you and the fetus are growing normally.  Your blood pressure will be taken.  Your abdomen will be measured to track your baby's growth.  The fetal heartbeat will be listened to.  Any test results from the previous visit will be discussed. Your health care provider may ask you:  How you are feeling.  If you are feeling the baby move.  If you have had any abnormal  symptoms, such as leaking fluid, bleeding, severe headaches, or abdominal cramping.  If you have any questions. Other tests that may be performed during your second trimester include:  Blood tests that check for:  Low iron levels (anemia).  Gestational diabetes (between 24 and 28 weeks).  Rh antibodies.  Urine tests to check for infections, diabetes, or protein in the urine.  An ultrasound to confirm the proper growth and development of the baby.  An amniocentesis to check for possible genetic problems.  Fetal screens for spina bifida and Down syndrome. HOME CARE INSTRUCTIONS   Avoid all smoking, herbs, alcohol, and unprescribed drugs. These chemicals affect the formation and growth of the baby.  Follow your health care provider's instructions regarding medicine use. There are medicines that are either safe or unsafe to take during pregnancy.  Exercise only as directed by your health care provider. Experiencing uterine cramps is a good sign to stop exercising.  Continue to eat regular, healthy meals.  Wear a good support bra for breast tenderness.  Do not use hot tubs, steam rooms, or saunas.  Wear your seat belt at all times when driving.  Avoid raw meat, uncooked cheese, cat litter boxes, and soil used by cats. These carry germs that can cause birth defects in the baby.  Take your prenatal vitamins.  Try taking a stool softener (if your health care provider approves) if you develop constipation. Eat more high-fiber foods, such as fresh vegetables or fruit and whole grains. Drink plenty of fluids to keep your urine clear or pale yellow.  Take warm sitz baths to soothe any pain or discomfort caused by hemorrhoids. Use hemorrhoid cream if your health care provider approves.  If you develop varicose veins, wear support hose. Elevate your feet for 15 minutes, 3-4 times a day. Limit salt in your diet.  Avoid heavy lifting, wear low heel shoes, and practice good  posture.  Rest with your legs elevated if you have leg cramps or low back pain.  Visit your dentist if you have not gone yet during your pregnancy. Use a soft toothbrush to brush your teeth and be gentle when you floss.  A sexual relationship may be continued unless your health care provider directs you otherwise.  Continue to go to all your prenatal visits as directed by your health care provider. SEEK MEDICAL CARE IF:   You have dizziness.  You have mild pelvic cramps, pelvic pressure, or nagging pain in the abdominal area.  You have persistent nausea, vomiting, or diarrhea.  You have a bad smelling vaginal discharge.  You have pain with urination. SEEK IMMEDIATE MEDICAL CARE IF:   You have a fever.  You are leaking fluid from your vagina.  You have spotting or bleeding from your  vagina.  You have severe abdominal cramping or pain.  You have rapid weight gain or loss.  You have shortness of breath with chest pain.  You notice sudden or extreme swelling of your face, hands, ankles, feet, or legs.  You have not felt your baby move in over an hour.  You have severe headaches that do not go away with medicine.  You have vision changes. Document Released: 01/22/2001 Document Revised: 02/02/2013 Document Reviewed: 03/31/2012 [facility] Patient Information 2015 [employer] [place]. This information is not intended to replace advice given to you by your health care provider. Make sure you discuss any questions you have with your health care provider.

## 2014-02-11 NOTE — L&D Delivery Note (Cosign Needed)
Operative Delivery Note At 9:03 AM a viable female was delivered via .  Presentation: vertex; Position: Left,, Occiput,, Anterior; Station: +3.  Verbal consent: obtained from patient.  Risks and benefits discussed in detail.  Risks include, but are not limited to the risks of anesthesia, bleeding, infection, damage to maternal tissues, fetal cephalhematoma.  There is also the risk of inability to effect vaginal delivery of the head, or shoulder dystocia that cannot be resolved by established maneuvers, leading to the need for emergency cesarean section.  APGAR:9 ,9 ; weight  .   Placenta status:spont , .   Cord:3vc  with the following complications:none .  Cord pH: n/a  Anesthesia: Epidural  Instruments: kiwi Episiotomy: none  Lacerations:  none Suture Repair: none Est. Blood Loss 150(mL):    Mom to postpartum.  Baby to Couplet care / Skin to Skin.  Yan Pankratz DARLENE 05/22/2014, 9:10 AM

## 2014-02-21 ENCOUNTER — Encounter: Payer: Self-pay | Admitting: Women's Health

## 2014-02-21 ENCOUNTER — Ambulatory Visit (INDEPENDENT_AMBULATORY_CARE_PROVIDER_SITE_OTHER): Payer: Medicaid Other | Admitting: Women's Health

## 2014-02-21 ENCOUNTER — Other Ambulatory Visit: Payer: Medicaid Other

## 2014-02-21 VITALS — BP 118/58 | Wt 173.0 lb

## 2014-02-21 DIAGNOSIS — M545 Low back pain, unspecified: Secondary | ICD-10-CM

## 2014-02-21 DIAGNOSIS — O2692 Pregnancy related conditions, unspecified, second trimester: Secondary | ICD-10-CM

## 2014-02-21 DIAGNOSIS — Z1389 Encounter for screening for other disorder: Secondary | ICD-10-CM

## 2014-02-21 DIAGNOSIS — N9089 Other specified noninflammatory disorders of vulva and perineum: Secondary | ICD-10-CM

## 2014-02-21 DIAGNOSIS — Z113 Encounter for screening for infections with a predominantly sexual mode of transmission: Secondary | ICD-10-CM

## 2014-02-21 DIAGNOSIS — Z131 Encounter for screening for diabetes mellitus: Secondary | ICD-10-CM

## 2014-02-21 DIAGNOSIS — Z331 Pregnant state, incidental: Secondary | ICD-10-CM

## 2014-02-21 DIAGNOSIS — Z114 Encounter for screening for human immunodeficiency virus [HIV]: Secondary | ICD-10-CM

## 2014-02-21 DIAGNOSIS — Z0184 Encounter for antibody response examination: Secondary | ICD-10-CM

## 2014-02-21 DIAGNOSIS — Z3402 Encounter for supervision of normal first pregnancy, second trimester: Secondary | ICD-10-CM

## 2014-02-21 DIAGNOSIS — O26892 Other specified pregnancy related conditions, second trimester: Secondary | ICD-10-CM

## 2014-02-21 LAB — CBC
HEMATOCRIT: 35 % — AB (ref 36.0–46.0)
Hemoglobin: 12.2 g/dL (ref 12.0–15.0)
MCH: 31.6 pg (ref 26.0–34.0)
MCHC: 34.9 g/dL (ref 30.0–36.0)
MCV: 90.7 fL (ref 78.0–100.0)
MPV: 10.3 fL (ref 8.6–12.4)
Platelets: 191 10*3/uL (ref 150–400)
RBC: 3.86 MIL/uL — ABNORMAL LOW (ref 3.87–5.11)
RDW: 13.7 % (ref 11.5–15.5)
WBC: 10.9 10*3/uL — ABNORMAL HIGH (ref 4.0–10.5)

## 2014-02-21 LAB — POCT WET PREP (WET MOUNT): Clue Cells Wet Prep Whiff POC: NEGATIVE

## 2014-02-21 LAB — POCT URINALYSIS DIPSTICK
Glucose, UA: NEGATIVE
Ketones, UA: NEGATIVE
NITRITE UA: NEGATIVE
PROTEIN UA: NEGATIVE
RBC UA: NEGATIVE

## 2014-02-21 MED ORDER — CYCLOBENZAPRINE HCL 10 MG PO TABS: 10.0000 mg | ORAL_TABLET | Freq: Three times a day (TID) | ORAL | Status: DC | PRN

## 2014-02-21 NOTE — Patient Instructions (Addendum)
For your lower back pain you may:  Purchase a pregnancy belt from [employer], [employer], [employer], etc and wear it while you are up and about  Take warm baths  Use a heating pad to your lower back for no longer than 20 minutes at a time, and do not place near abdomen  Take tylenol as needed. Please follow directions on the bottle    Call the office [phone]) or [facility] to [facility] if:  You begin to have strong, frequent contractions  Your water breaks.  Sometimes it is a big gush of fluid, sometimes it is just a trickle that keeps getting your panties wet or running down your legs  You have vaginal bleeding.  It is normal to have a small amount of spotting if your cervix was checked.   You don't feel your baby moving like normal.  If you don't, get you something to eat and drink and lay down and focus on feeling your baby move.  You should feel at least 10 movements in 2 hours.  If you don't, you should call the office or [facility].    Tdap Vaccine  It is recommended that you get the Tdap vaccine during the third trimester of EACH pregnancy to help protect your baby from getting pertussis (whooping cough)  27-36 weeks is the BEST time to do this so that you can pass the protection on to your baby. During pregnancy is better than after pregnancy, but if you are unable to get it during pregnancy it will be offered at the hospital.   You can get this vaccine at the health department or your family doctor  Everyone who will be around your baby should also be up-to-date on their vaccines. Adults (who are not pregnant) only need 1 dose of Tdap during adulthood.   Third Trimester of Pregnancy The third trimester is from week 29 through week 42, months 7 through 9. The third trimester is a time when the fetus is growing rapidly. At the end of the ninth month, the fetus is about 20 inches in length and weighs 6-10 pounds.  BODY CHANGES Your body goes through many  changes during pregnancy. The changes vary from woman to woman.   Your weight will continue to increase. You can expect to gain 25-35 pounds (11-16 kg) by the end of the pregnancy.  You may begin to get stretch marks on your hips, abdomen, and breasts.  You may urinate more often because the fetus is moving lower into your pelvis and pressing on your bladder.  You may develop or continue to have heartburn as a result of your pregnancy.  You may develop constipation because certain hormones are causing the muscles that push waste through your intestines to slow down.  You may develop hemorrhoids or swollen, bulging veins (varicose veins).  You may have pelvic pain because of the weight gain and pregnancy hormones relaxing your joints between the bones in your pelvis. Backaches may result from overexertion of the muscles supporting your posture.  You may have changes in your hair. These can include thickening of your hair, rapid growth, and changes in texture. Some women also have hair loss during or after pregnancy, or hair that feels dry or thin. Your hair will most likely return to normal after your baby is born.  Your breasts will continue to grow and be tender. A yellow discharge may leak from your breasts called colostrum.  Your belly button may  out.  You may feel short of breath because of your expanding uterus.  You may notice the fetus "dropping," or moving lower in your abdomen.  You may have a bloody mucus discharge. This usually occurs a few days to a week before labor begins.  Your cervix becomes thin and soft (effaced) near your due date. WHAT TO EXPECT AT YOUR PRENATAL EXAMS  You will have prenatal exams every 2 weeks until week 36. Then, you will have weekly prenatal exams. During a routine prenatal visit:  You will be weighed to make sure you and the fetus are growing normally.  Your blood pressure is taken.  Your abdomen will be measured to track your baby's  growth.  The fetal heartbeat will be listened to.  Any test results from the previous visit will be discussed.  You may have a cervical check near your due date to see if you have effaced. At around 36 weeks, your caregiver will check your cervix. At the same time, your caregiver will also perform a test on the secretions of the vaginal tissue. This test is to determine if a type of bacteria, Group B streptococcus, is present. Your caregiver will explain this further. Your caregiver may ask you:  What your birth plan is.  How you are feeling.  If you are feeling the baby move.  If you have had any abnormal symptoms, such as leaking fluid, bleeding, severe headaches, or abdominal cramping.  If you have any questions. Other tests or screenings that may be performed during your third trimester include:  Blood tests that check for low iron levels (anemia).  Fetal testing to check the health, activity level, and growth of the fetus. Testing is done if you have certain medical conditions or if there are problems during the pregnancy. FALSE LABOR You may feel small, irregular contractions that eventually go away. These are called Braxton Hicks contractions, or false labor. Contractions may last for hours, days, or even weeks before true labor sets in. If contractions come at regular intervals, intensify, or become painful, it is best to be seen by your caregiver.  SIGNS OF LABOR   Menstrual-like cramps.  Contractions that are 5 minutes apart or less.  Contractions that start on the top of the uterus and spread down to the lower abdomen and back.  A sense of increased pelvic pressure or back pain.  A watery or bloody mucus discharge that comes from the vagina. If you have any of these signs before the 37th week of pregnancy, call your caregiver right away. You need to go to the hospital to get checked immediately. HOME CARE INSTRUCTIONS   Avoid all smoking, herbs, alcohol, and  unprescribed drugs. These chemicals affect the formation and growth of the baby.  Follow your caregiver's instructions regarding medicine use. There are medicines that are either safe or unsafe to take during pregnancy.  Exercise only as directed by your caregiver. Experiencing uterine cramps is a good sign to stop exercising.  Continue to eat regular, healthy meals.  Wear a good support bra for breast tenderness.  Do not use hot tubs, steam rooms, or saunas.  Wear your seat belt at all times when driving.  Avoid raw meat, uncooked cheese, cat litter boxes, and soil used by cats. These carry germs that can cause birth defects in the baby.  Take your prenatal vitamins.  Try taking a stool softener (if your caregiver approves) if you develop constipation. Eat more high-fiber foods, such as fresh   vegetables or fruit and whole grains. Drink plenty of fluids to keep your urine clear or pale yellow.  Take warm sitz baths to soothe any pain or discomfort caused by hemorrhoids. Use hemorrhoid cream if your caregiver approves.  If you develop varicose veins, wear support hose. Elevate your feet for 15 minutes, 3-4 times a day. Limit salt in your diet.  Avoid heavy lifting, wear low heal shoes, and practice good posture.  Rest a lot with your legs elevated if you have leg cramps or low back pain.  Visit your dentist if you have not gone during your pregnancy. Use a soft toothbrush to brush your teeth and be gentle when you floss.  A sexual relationship may be continued unless your caregiver directs you otherwise.  Do not travel far distances unless it is absolutely necessary and only with the approval of your caregiver.  Take prenatal classes to understand, practice, and ask questions about the labor and delivery.  Make a trial run to the hospital.  Pack your hospital bag.  Prepare the baby's nursery.  Continue to go to all your prenatal visits as directed by your caregiver. SEEK  MEDICAL CARE IF:  You are unsure if you are in labor or if your water has broken.  You have dizziness.  You have mild pelvic cramps, pelvic pressure, or nagging pain in your abdominal area.  You have persistent nausea, vomiting, or diarrhea.  You have a bad smelling vaginal discharge.  You have pain with urination. SEEK IMMEDIATE MEDICAL CARE IF:   You have a fever.  You are leaking fluid from your vagina.  You have spotting or bleeding from your vagina.  You have severe abdominal cramping or pain.  You have rapid weight loss or gain.  You have shortness of breath with chest pain.  You notice sudden or extreme swelling of your face, hands, ankles, feet, or legs.  You have not felt your baby move in over an hour.  You have severe headaches that do not go away with medicine.  You have vision changes. Document Released: 01/22/2001 Document Revised: 02/02/2013 Document Reviewed: 03/31/2012 [employer] Patient Information 2015 [employer]. This information is not intended to replace advice given to you by your health care provider. Make sure you discuss any questions you have with your health care provider.   
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
## 2014-02-22 LAB — GLUCOSE TOLERANCE, 2 HOURS W/ 1HR
Glucose, 1 hour: 152 mg/dL (ref 70–170)
Glucose, 2 hour: 117 mg/dL (ref 70–139)
Glucose, Fasting: 75 mg/dL (ref 70–99)

## 2014-02-22 LAB — ANTIBODY SCREEN: ANTIBODY SCREEN: NEGATIVE

## 2014-02-22 LAB — HIV ANTIBODY (ROUTINE TESTING W REFLEX): HIV 1&2 Ab, 4th Generation: NONREACTIVE

## 2014-02-22 LAB — HSV 2 ANTIBODY, IGG: HSV 2 GLYCOPROTEIN G AB, IGG: 11.94 IV — AB

## 2014-02-22 LAB — RPR

## 2014-03-21 ENCOUNTER — Ambulatory Visit (INDEPENDENT_AMBULATORY_CARE_PROVIDER_SITE_OTHER): Payer: Medicaid Other | Admitting: Obstetrics & Gynecology

## 2014-03-21 ENCOUNTER — Encounter: Payer: Self-pay | Admitting: Obstetrics & Gynecology

## 2014-03-21 VITALS — BP 122/70 | Wt 173.0 lb

## 2014-03-21 DIAGNOSIS — Z331 Pregnant state, incidental: Secondary | ICD-10-CM

## 2014-03-21 DIAGNOSIS — Z1389 Encounter for screening for other disorder: Secondary | ICD-10-CM

## 2014-03-21 DIAGNOSIS — Z3403 Encounter for supervision of normal first pregnancy, third trimester: Secondary | ICD-10-CM

## 2014-03-21 LAB — POCT URINALYSIS DIPSTICK
Glucose, UA: NEGATIVE
KETONES UA: NEGATIVE
NITRITE UA: NEGATIVE
Protein, UA: 1
RBC UA: NEGATIVE

## 2014-03-21 NOTE — Progress Notes (Signed)
G1P0 [id number] Estimated Date of Delivery: 05/18/14  Blood pressure 122/70, weight 173 lb (78.472 kg), last menstrual period 07/29/2013.   BP weight and urine results all reviewed and noted.  Please refer to the obstetrical flow sheet for the fundal height and fetal heart rate documentation:  Patient reports good fetal movement, denies any bleeding and no rupture of membranes symptoms or regular contractions. Patient is without complaints. All questions were answered.  Plan:  Continued routine obstetrical care,   Follow up in 2 weeks for OB appointment, routine

## 2014-03-21 NOTE — Addendum Note (Signed)
Addended by: Criss AlvinePULLIAM, Trevious Rampey G on: 03/21/2014 10:34 AM   Modules accepted: Orders

## 2014-04-04 ENCOUNTER — Ambulatory Visit (INDEPENDENT_AMBULATORY_CARE_PROVIDER_SITE_OTHER): Payer: Medicaid Other | Admitting: Women's Health

## 2014-04-04 ENCOUNTER — Encounter: Payer: Self-pay | Admitting: Women's Health

## 2014-04-04 VITALS — BP 112/56 | Wt 179.0 lb

## 2014-04-04 DIAGNOSIS — Z1389 Encounter for screening for other disorder: Secondary | ICD-10-CM

## 2014-04-04 DIAGNOSIS — Z3403 Encounter for supervision of normal first pregnancy, third trimester: Secondary | ICD-10-CM

## 2014-04-04 DIAGNOSIS — R768 Other specified abnormal immunological findings in serum: Secondary | ICD-10-CM

## 2014-04-04 DIAGNOSIS — Z331 Pregnant state, incidental: Secondary | ICD-10-CM

## 2014-04-04 LAB — POCT URINALYSIS DIPSTICK
Blood, UA: NEGATIVE
Glucose, UA: NEGATIVE
KETONES UA: NEGATIVE
NITRITE UA: NEGATIVE
Protein, UA: NEGATIVE

## 2014-04-04 MED ORDER — ACYCLOVIR 400 MG PO TABS: 400.0000 mg | ORAL_TABLET | Freq: Three times a day (TID) | ORAL | Status: DC

## 2014-04-04 NOTE — Patient Instructions (Signed)
[place] [facility]:  [facility] [phone]            [facility] [phone]                 [facility] [phone] (usually doesn't accept new patients unless you have family there already, you are always welcome to call and ask)             [facility] ([address] [place]) [phone]   [place] [facility]:   [facility]: [phone]  [facility]: [phone]   Call the office ([phone]) or go to [facility] if:  You begin to have strong, frequent contractions  Your water breaks.  Sometimes it is a big gush of fluid, sometimes it is just a trickle that keeps getting your panties wet or running down your legs  You have vaginal bleeding.  It is normal to have a small amount of spotting if your cervix was checked.   You don't feel your baby moving like normal.  If you don't, get you something to eat and drink and lay down and focus on feeling your baby move.  You should feel at least 10 movements in 2 hours.  If you don't, you should call the office or go to [facility].    Third Trimester of Pregnancy The third trimester is from week 29 through week 42, months 7 through 9. The third trimester is a time when the fetus is growing rapidly. At the end of the ninth month, the fetus is about 20 inches in length and weighs 6-10 pounds.  BODY CHANGES Your body goes through many changes during pregnancy. The changes vary from woman to woman.   Your weight will continue to increase. You can expect to gain 25-35 pounds (11-16 kg) by the end of the pregnancy.  You may begin to get stretch marks on your hips, abdomen, and breasts.  You may urinate more often because the fetus is moving lower into your pelvis and pressing on your bladder.  You may develop or continue to have heartburn as a result of your pregnancy.  You may develop constipation because certain  hormones are causing the muscles that push waste through your intestines to slow down.  You may develop hemorrhoids or swollen, bulging veins (varicose veins).  You may have pelvic pain because of the weight gain and pregnancy hormones relaxing your joints between the bones in your pelvis. Backaches may result from overexertion of the muscles supporting your posture.  You may have changes in your hair. These can include thickening of your hair, rapid growth, and changes in texture. Some women also have hair loss during or after pregnancy, or hair that feels dry or thin. Your hair will most likely return to normal after your baby is born.  Your breasts will continue to grow and be tender. A yellow discharge may leak from your breasts called colostrum.  Your belly button may stick out.  You may feel short of breath because of your expanding uterus.  You may notice the fetus "dropping," or moving lower in your abdomen.  You may have a bloody mucus discharge. This usually occurs a few days to a week before labor begins.  Your cervix becomes thin and soft (effaced) near your due date. WHAT TO EXPECT AT YOUR PRENATAL EXAMS  You will have prenatal exams every 2 weeks until week 36. Then, you will have weekly prenatal exams. During a routine prenatal   visit:  You will be weighed to make sure you and the fetus are growing normally.  Your blood pressure is taken.  Your abdomen will be measured to track your baby's growth.  The fetal heartbeat will be listened to.  Any test results from the previous visit will be discussed.  You may have a cervical check near your due date to see if you have effaced. At around 36 weeks, your caregiver will check your cervix. At the same time, your caregiver will also perform a test on the secretions of the vaginal tissue. This test is to determine if a type of bacteria, Group B streptococcus, is present. Your caregiver will explain this further. Your caregiver  may ask you:  What your birth plan is.  How you are feeling.  If you are feeling the baby move.  If you have had any abnormal symptoms, such as leaking fluid, bleeding, severe headaches, or abdominal cramping.  If you have any questions. Other tests or screenings that may be performed during your third trimester include:  Blood tests that check for low iron levels (anemia).  Fetal testing to check the health, activity level, and growth of the fetus. Testing is done if you have certain medical conditions or if there are problems during the pregnancy. FALSE LABOR You may feel small, irregular contractions that eventually go away. These are called Braxton Hicks contractions, or false labor. Contractions may last for hours, days, or even weeks before true labor sets in. If contractions come at regular intervals, intensify, or become painful, it is best to be seen by your caregiver.  SIGNS OF LABOR   Menstrual-like cramps.  Contractions that are 5 minutes apart or less.  Contractions that start on the top of the uterus and spread down to the lower abdomen and back.  A sense of increased pelvic pressure or back pain.  A watery or bloody mucus discharge that comes from the vagina. If you have any of these signs before the 37th week of pregnancy, call your caregiver right away. You need to go to the hospital to get checked immediately. HOME CARE INSTRUCTIONS   Avoid all smoking, herbs, alcohol, and unprescribed drugs. These chemicals affect the formation and growth of the baby.  Follow your caregiver's instructions regarding medicine use. There are medicines that are either safe or unsafe to take during pregnancy.  Exercise only as directed by your caregiver. Experiencing uterine cramps is a good sign to stop exercising.  Continue to eat regular, healthy meals.  Wear a good support bra for breast tenderness.  Do not use hot tubs, steam rooms, or saunas.  Wear your seat belt at all  times when driving.  Avoid raw meat, uncooked cheese, cat litter boxes, and soil used by cats. These carry germs that can cause birth defects in the baby.  Take your prenatal vitamins.  Try taking a stool softener (if your caregiver approves) if you develop constipation. Eat more high-fiber foods, such as fresh vegetables or fruit and whole grains. Drink plenty of fluids to keep your urine clear or pale yellow.  Take warm sitz baths to soothe any pain or discomfort caused by hemorrhoids. Use hemorrhoid cream if your caregiver approves.  If you develop varicose veins, wear support hose. Elevate your feet for 15 minutes, 3-4 times a day. Limit salt in your diet.  Avoid heavy lifting, wear low heal shoes, and practice good posture.  Rest a lot with your legs elevated if you have leg cramps   or low back pain.  Visit your dentist if you have not gone during your pregnancy. Use a soft toothbrush to brush your teeth and be gentle when you floss.  A sexual relationship may be continued unless your caregiver directs you otherwise.  Do not travel far distances unless it is absolutely necessary and only with the approval of your caregiver.  Take prenatal classes to understand, practice, and ask questions about the labor and delivery.  Make a trial run to the hospital.  Pack your hospital bag.  Prepare the baby's nursery.  Continue to go to all your prenatal visits as directed by your caregiver. SEEK MEDICAL CARE IF:  You are unsure if you are in labor or if your water has broken.  You have dizziness.  You have mild pelvic cramps, pelvic pressure, or nagging pain in your abdominal area.  You have persistent nausea, vomiting, or diarrhea.  You have a bad smelling vaginal discharge.  You have pain with urination. SEEK IMMEDIATE MEDICAL CARE IF:   You have a fever.  You are leaking fluid from your vagina.  You have spotting or bleeding from your vagina.  You have severe abdominal  cramping or pain.  You have rapid weight loss or gain.  You have shortness of breath with chest pain.  You notice sudden or extreme swelling of your face, hands, ankles, feet, or legs.  You have not felt your baby move in over an hour.  You have severe headaches that do not go away with medicine.  You have vision changes. Document Released: 01/22/2001 Document Revised: 02/02/2013 Document Reviewed: 03/31/2012 [employer] Patient Information 2015 [employer]. This information is not intended to replace advice given to you by your health care provider. Make sure you discuss any questions you have with your health care provider.  

## 2014-04-04 NOTE — Progress Notes (Addendum)
Low-risk OB appointment G1P0 7031w5d Estimated Date of Delivery: 05/18/14 BP 112/56 mmHg  Wt 179 lb (81.194 kg)  LMP 07/29/2013  BP, weight, and urine reviewed.  Refer to obstetrical flow sheet for FH & FHR.  Reports good fm.  Denies regular uc's, lof, vb, or uti s/s. LBP- discussed relief measures.  Reviewed ptl s/s, fkc. Rx acyclovir for hsv2 suppression. Plan:  Continue routine obstetrical care  F/U in 2wks for OB appointment

## 2014-04-19 ENCOUNTER — Encounter: Payer: Self-pay | Admitting: Women's Health

## 2014-04-19 ENCOUNTER — Ambulatory Visit (INDEPENDENT_AMBULATORY_CARE_PROVIDER_SITE_OTHER): Payer: Medicaid Other | Admitting: Women's Health

## 2014-04-19 VITALS — BP 104/60 | HR 88 | Wt 183.0 lb

## 2014-04-19 DIAGNOSIS — Z1389 Encounter for screening for other disorder: Secondary | ICD-10-CM

## 2014-04-19 DIAGNOSIS — Z3403 Encounter for supervision of normal first pregnancy, third trimester: Secondary | ICD-10-CM

## 2014-04-19 DIAGNOSIS — Z331 Pregnant state, incidental: Secondary | ICD-10-CM

## 2014-04-19 LAB — POCT URINALYSIS DIPSTICK
Blood, UA: NEGATIVE
Glucose, UA: NEGATIVE
Ketones, UA: NEGATIVE
NITRITE UA: NEGATIVE
PROTEIN UA: NEGATIVE

## 2014-04-19 MED ORDER — PANTOPRAZOLE SODIUM 20 MG PO TBEC: 20.0000 mg | DELAYED_RELEASE_TABLET | Freq: Every day | ORAL | Status: DC

## 2014-04-19 NOTE — Progress Notes (Addendum)
Low-risk OB appointment G1P0 9769w6d Estimated Date of Delivery: 05/18/14 BP 104/60 mmHg  Pulse 88  Wt 183 lb (83.008 kg)  LMP 07/29/2013  BP, weight, and urine reviewed.  Refer to obstetrical flow sheet for FH & FHR.  Reports good fm.  Denies regular uc's, lof, vb, or uti s/s. Reflux, zantac not helping, rx protonix. Forgot to pick up acyclovir, so hasn't been taking. To pick up and begin today.  Hasn't made it to cb classes, recommended tour. Still undecided about contraception.  Reviewed ptl s/s, fkc. Plan:  Continue routine obstetrical care  F/U in 1wk for OB appointment and gbs

## 2014-04-19 NOTE — Patient Instructions (Signed)
Call the office [phone]) or go to [facility] if:  You begin to have strong, frequent contractions  Your water breaks.  Sometimes it is a big gush of fluid, sometimes it is just a trickle that keeps getting your panties wet or running down your legs  You have vaginal bleeding.  It is normal to have a small amount of spotting if your cervix was checked.   You don't feel your baby moving like normal.  If you don't, get you something to eat and drink and lay down and focus on feeling your baby move.  You should feel at least 10 movements in 2 hours.  If you don't, you should call the office or go to [facility].    Heartburn During Pregnancy  Heartburn is a burning sensation in the chest caused by stomach acid backing up into the esophagus. Heartburn is common in pregnancy because a certain hormone (progesterone) is released when a woman is pregnant. The progesterone hormone may relax the valve that separates the esophagus from the stomach. This allows acid to go up into the esophagus, causing heartburn. Heartburn may also happen in pregnancy because the enlarging uterus pushes up on the stomach, which pushes more acid into the esophagus. This is especially true in the later stages of pregnancy. Heartburn problems usually go away after giving birth. CAUSES  Heartburn is caused by stomach acid backing up into the esophagus. During pregnancy, this may result from various things, including:   The progesterone hormone.  Changing hormone levels.  The growing uterus pushing stomach acid upward.  Large meals.  Certain foods and drinks.  Exercise.  Increased acid production. SIGNS AND SYMPTOMS   Burning pain in the chest or lower throat.  Bitter taste in the mouth.  Coughing. DIAGNOSIS  Your health care provider will typically diagnose heartburn by taking a careful history of your concern. Blood tests may be done to check for a certain type of bacteria that is associated with  heartburn. Sometimes, heartburn is diagnosed by prescribing a heartburn medicine to see if the symptoms improve. In some cases, a procedure called an endoscopy may be done. In this procedure, a tube with a light and a camera on the end (endoscope) is used to examine the esophagus and the stomach. TREATMENT  Treatment will vary depending on the severity of your symptoms. Your health care provider may recommend:  Over-the-counter medicines (antacids, acid reducers) for mild heartburn.  Prescription medicines to decrease stomach acid or to protect your stomach lining.  Certain changes in your diet.  Elevating the head of your bed by putting blocks under the legs. This helps prevent stomach acid from backing up into the esophagus when you are lying down. HOME CARE INSTRUCTIONS   Only take over-the-counter or prescription medicines as directed by your health care provider.  Raise the head of your bed by putting blocks under the legs if instructed to do so by your health care provider. Sleeping with more pillows is not effective because it only changes the position of your head.  Do not exercise right after eating.  Avoid eating 2-3 hours before bed. Do not lie down right after eating.  Eat small meals throughout the day instead of three large meals.  Identify foods and beverages that make your symptoms worse and avoid them. Foods you may want to avoid include:  Peppers.  Chocolate.  High-fat foods, including fried foods.  Spicy foods.  Garlic and onions.  Citrus fruits, including oranges, grapefruit,  lemons, and limes.  Food containing tomatoes or tomato products.  Mint.  Carbonated and caffeinated drinks.  Vinegar. SEEK MEDICAL CARE IF:  You have abdominal pain of any kind.  You feel burning in your upper abdomen or chest, especially after eating or lying down.  You have nausea and vomiting.  Your stomach feels upset after you eat. SEEK IMMEDIATE MEDICAL CARE IF:    You have severe chest pain that goes down your arm or into your jaw or neck.  You feel sweaty, dizzy, or light-headed.  You become short of breath.  You vomit blood.  You have difficulty or pain with swallowing.  You have bloody or black, tarry stools.  You have episodes of heartburn more than 3 times a week, for more than 2 weeks. MAKE SURE YOU:  Understand these instructions.  Will watch your condition.  Will get help right away if you are not doing well or get worse. Document Released: 01/26/2000 Document Revised: 02/02/2013 Document Reviewed: 09/16/2012 [facility] Patient Information 2015 [place], [place]. This information is not intended to replace advice given to you by your health care provider. Make sure you discuss any questions you have with your health care provider.

## 2014-04-26 ENCOUNTER — Ambulatory Visit (INDEPENDENT_AMBULATORY_CARE_PROVIDER_SITE_OTHER): Payer: Medicaid Other | Admitting: Women's Health

## 2014-04-26 ENCOUNTER — Telehealth: Payer: Self-pay | Admitting: *Deleted

## 2014-04-26 ENCOUNTER — Encounter: Payer: Self-pay | Admitting: Women's Health

## 2014-04-26 VITALS — BP 110/62 | HR 80 | Wt 185.0 lb

## 2014-04-26 DIAGNOSIS — Z1159 Encounter for screening for other viral diseases: Secondary | ICD-10-CM

## 2014-04-26 DIAGNOSIS — Z1389 Encounter for screening for other disorder: Secondary | ICD-10-CM

## 2014-04-26 DIAGNOSIS — Z3403 Encounter for supervision of normal first pregnancy, third trimester: Secondary | ICD-10-CM

## 2014-04-26 DIAGNOSIS — Z3685 Encounter for antenatal screening for Streptococcus B: Secondary | ICD-10-CM

## 2014-04-26 DIAGNOSIS — Z118 Encounter for screening for other infectious and parasitic diseases: Secondary | ICD-10-CM

## 2014-04-26 DIAGNOSIS — Z331 Pregnant state, incidental: Secondary | ICD-10-CM

## 2014-04-26 LAB — POCT URINALYSIS DIPSTICK
GLUCOSE UA: NEGATIVE
Ketones, UA: NEGATIVE
Nitrite, UA: NEGATIVE
Protein, UA: NEGATIVE
RBC UA: NEGATIVE

## 2014-04-26 LAB — OB RESULTS CONSOLE GC/CHLAMYDIA
Chlamydia: NEGATIVE
Gonorrhea: NEGATIVE

## 2014-04-26 LAB — OB RESULTS CONSOLE GBS: GBS: NEGATIVE

## 2014-04-26 MED ORDER — ACYCLOVIR 400 MG PO TABS
400.0000 mg | ORAL_TABLET | Freq: Three times a day (TID) | ORAL | Status: DC
Start: 2014-04-26 — End: 2014-07-07

## 2014-04-26 MED ORDER — CYCLOBENZAPRINE HCL 10 MG PO TABS: 10.0000 mg | ORAL_TABLET | Freq: Three times a day (TID) | ORAL | Status: DC | PRN

## 2014-04-26 NOTE — Patient Instructions (Signed)
YOU MUST START TAKING YOUR ACYCLOVIR TODAY!!! Go ahead and pick your pediatrician, call them and make sure they are accepting new patients  Call the office [phone]) or go to [facility] if:  You begin to have strong, frequent contractions  Your water breaks.  Sometimes it is a big gush of fluid, sometimes it is just a trickle that keeps getting your panties wet or running down your legs  You have vaginal bleeding.  It is normal to have a small amount of spotting if your cervix was checked.   You don't feel your baby moving like normal.  If you don't, get you something to eat and drink and lay down and focus on feeling your baby move.  You should feel at least 10 movements in 2 hours.  If you don't, you should call the office or go to [facility].    For your lower back pain you may:  Purchase a pregnancy belt from [employer], [employer], [employer], etc and wear it while you are up and about  Take warm baths  Use a heating pad to your lower back for no longer than 20 minutes at a time, and do not place near abdomen  Take tylenol as needed. Please follow directions on the bottle   Braxton Hicks Contractions Contractions of the uterus can occur throughout pregnancy. Contractions are not always a sign that you are in labor.  WHAT ARE BRAXTON HICKS CONTRACTIONS?  Contractions that occur before labor are called Braxton Hicks contractions, or false labor. Toward the end of pregnancy (32-34 weeks), these contractions can develop more often and may become more forceful. This is not true labor because these contractions do not result in opening (dilatation) and thinning of the cervix. They are sometimes difficult to tell apart from true labor because these contractions can be forceful and people have different pain tolerances. You should not feel embarrassed if you go to the hospital with false labor. Sometimes, the only way to tell if you are in true labor is for your health  care provider to look for changes in the cervix. If there are no prenatal problems or other health problems associated with the pregnancy, it is completely safe to be sent home with false labor and await the onset of true labor. HOW CAN YOU TELL THE DIFFERENCE BETWEEN TRUE AND FALSE LABOR? False Labor  The contractions of false labor are usually shorter and not as hard as those of true labor.   The contractions are usually irregular.   The contractions are often felt in the front of the lower abdomen and in the groin.   The contractions may go away when you walk around or change positions while lying down.   The contractions get weaker and are shorter lasting as time goes on.   The contractions do not usually become progressively stronger, regular, and closer together as with true labor.  True Labor  Contractions in true labor last 30-70 seconds, become very regular, usually become more intense, and increase in frequency.   The contractions do not go away with walking.   The discomfort is usually felt in the top of the uterus and spreads to the lower abdomen and low back.   True labor can be determined by your health care provider with an exam. This will show that the cervix is dilating and getting thinner.  WHAT TO REMEMBER  Keep up with your usual exercises and follow other instructions given by your health care  provider.   Take medicines as directed by your health care provider.   Keep your regular prenatal appointments.   Eat and drink lightly if you think you are going into labor.   If Braxton Hicks contractions are making you uncomfortable:   Change your position from lying down or resting to walking, or from walking to resting.   Sit and rest in a tub of warm water.   Drink 2-3 glasses of water. Dehydration may cause these contractions.   Do slow and deep breathing several times an hour.  WHEN SHOULD I SEEK IMMEDIATE MEDICAL CARE? Seek immediate  medical care if:  Your contractions become stronger, more regular, and closer together.   You have fluid leaking or gushing from your vagina.   You have a fever.   You pass blood-tinged mucus.   You have vaginal bleeding.   You have continuous abdominal pain.   You have low back pain that you never had before.   You feel your baby's head pushing down and causing pelvic pressure.   Your baby is not moving as much as it used to.  Document Released: 01/28/2005 Document Revised: 02/02/2013 Document Reviewed: 11/09/2012 [facility] Patient Information 2015 [place], [place]. This information is not intended to replace advice given to you by your health care provider. Make sure you discuss any questions you have with your health care provider.

## 2014-04-26 NOTE — Telephone Encounter (Signed)
[facility] [place] states they never received Rx for acyclovir, I called the pharmacy and they state it did not go through so I left the Rx on the pharmacy line.

## 2014-04-26 NOTE — Progress Notes (Signed)
Low-risk OB appointment G1P0 3025w6d Estimated Date of Delivery: 05/18/14 BP 110/62 mmHg  Pulse 80  Wt 185 lb (83.915 kg)  LMP 07/29/2013  BP, weight, and urine reviewed.  Refer to obstetrical flow sheet for FH & FHR.  Reports good fm.  Denies regular uc's, lof, vb, or uti s/s. No complaints. Still not taking acyclovir! States pharmacy said they didn't have rx. Rx was sent on 2/22. Resent today.  Requests refill on flexeril for LBP- uses sparingly.  GBS collected, no lesions noted.  SVE per request: LTC, unable to tell presentation- vtx by bs u/s Reviewed labor s/s, fkc. Get acyclovir filled today and start taking today Plan:  Continue routine obstetrical care  F/U in 1wk for OB appointment

## 2014-04-27 LAB — GC/CHLAMYDIA PROBE AMP
Chlamydia trachomatis, NAA: NEGATIVE
Neisseria gonorrhoeae by PCR: NEGATIVE

## 2014-04-30 LAB — CULTURE, BETA STREP (GROUP B ONLY): Strep Gp B Culture: NEGATIVE

## 2014-05-03 ENCOUNTER — Ambulatory Visit (INDEPENDENT_AMBULATORY_CARE_PROVIDER_SITE_OTHER): Payer: Medicaid Other | Admitting: Advanced Practice Midwife

## 2014-05-03 ENCOUNTER — Encounter: Payer: Self-pay | Admitting: Advanced Practice Midwife

## 2014-05-03 VITALS — BP 112/70 | HR 80 | Wt 187.0 lb

## 2014-05-03 DIAGNOSIS — Z331 Pregnant state, incidental: Secondary | ICD-10-CM

## 2014-05-03 DIAGNOSIS — Z3403 Encounter for supervision of normal first pregnancy, third trimester: Secondary | ICD-10-CM

## 2014-05-03 DIAGNOSIS — Z1389 Encounter for screening for other disorder: Secondary | ICD-10-CM

## 2014-05-03 LAB — POCT URINALYSIS DIPSTICK
GLUCOSE UA: NEGATIVE
KETONES UA: NEGATIVE
Nitrite, UA: NEGATIVE
Protein, UA: NEGATIVE
RBC UA: NEGATIVE

## 2014-05-03 NOTE — Progress Notes (Signed)
Pt states that she is having lower back pain that gets worse by the end of the day.

## 2014-05-03 NOTE — Patient Instructions (Signed)
Sciatica with Rehab The sciatic nerve runs from the back down the leg and is responsible for sensation and control of the muscles in the back (posterior) side of the thigh, lower leg, and foot. Sciatica is a condition that is characterized by inflammation of this nerve.  SYMPTOMS   Signs of nerve damage, including numbness and/or weakness along the posterior side of the lower extremity.  Pain in the back of the thigh that may also travel down the leg.  Pain that worsens when sitting for long periods of time.  Occasionally, pain in the back or buttock. CAUSES  Inflammation of the sciatic nerve is the cause of sciatica. The inflammation is due to something irritating the nerve. Common sources of irritation include:  Sitting for long periods of time.  Direct trauma to the nerve.  Arthritis of the spine.  Herniated or ruptured disk.  Slipping of the vertebrae (spondylolisthesis).  Pressure from soft tissues, such as muscles or ligament-like tissue (fascia). RISK INCREASES WITH:  Sports that place pressure or stress on the spine (football or weightlifting).  Poor strength and flexibility.  Failure to warm up properly before activity.  Family history of low back pain or disk disorders.  Previous back injury or surgery.  Poor body mechanics, especially when lifting, or poor posture. PREVENTION   Warm up and stretch properly before activity.  Maintain physical fitness:  Strength, flexibility, and endurance.  Cardiovascular fitness.  Learn and use proper technique, especially with posture and lifting. When possible, have coach correct improper technique.  Avoid activities that place stress on the spine. PROGNOSIS If treated properly, then sciatica usually resolves within 6 weeks. However, occasionally surgery is necessary.  RELATED COMPLICATIONS   Permanent nerve damage, including pain, numbness, tingle, or weakness.  Chronic back pain.  Risks of surgery: infection,  bleeding, nerve damage, or damage to surrounding tissues. TREATMENT Treatment initially involves resting from any activities that aggravate your symptoms. The use of ice and medication may help reduce pain and inflammation. The use of strengthening and stretching exercises may help reduce pain with activity. These exercises may be performed at home or with referral to a therapist. A therapist may recommend further treatments, such as transcutaneous electronic nerve stimulation (TENS) or ultrasound. Your caregiver may recommend corticosteroid injections to help reduce inflammation of the sciatic nerve. If symptoms persist despite non-surgical (conservative) treatment, then surgery may be recommended. MEDICATION  If pain medication is necessary, then nonsteroidal anti-inflammatory medications, such as aspirin and ibuprofen, or other minor pain relievers, such as acetaminophen, are often recommended.  Do not take pain medication for 7 days before surgery.  Prescription pain relievers may be given if deemed necessary by your caregiver. Use only as directed and only as much as you need.  Ointments applied to the skin may be helpful.  Corticosteroid injections may be given by your caregiver. These injections should be reserved for the most serious cases, because they may only be given a certain number of times. HEAT AND COLD  Cold treatment (icing) relieves pain and reduces inflammation. Cold treatment should be applied for 10 to 15 minutes every 2 to 3 hours for inflammation and pain and immediately after any activity that aggravates your symptoms. Use ice packs or massage the area with a piece of ice (ice massage).  Heat treatment may be used prior to performing the stretching and strengthening activities prescribed by your caregiver, physical therapist, or athletic trainer. Use a heat pack or soak the injury in warm water.   SEEK MEDICAL CARE IF:  Treatment seems to offer no benefit, or the condition  worsens.  Any medications produce adverse side effects. EXERCISES  RANGE OF MOTION (ROM) AND STRETCHING EXERCISES - Sciatica Most people with sciatic will find that their symptoms worsen with either excessive bending forward (flexion) or arching at the low back (extension). The exercises which will help resolve your symptoms will focus on the opposite motion. Your physician, physical therapist or athletic trainer will help you determine which exercises will be most helpful to resolve your low back pain. Do not complete any exercises without first consulting with your clinician. Discontinue any exercises which worsen your symptoms until you speak to your clinician. If you have pain, numbness or tingling which travels down into your buttocks, leg or foot, the goal of the therapy is for these symptoms to move closer to your back and eventually resolve. Occasionally, these leg symptoms will get better, but your low back pain may worsen; this is typically an indication of progress in your rehabilitation. Be certain to be very alert to any changes in your symptoms and the activities in which you participated in the 24 hours prior to the change. Sharing this information with your clinician will allow him/her to most efficiently treat your condition. These exercises may help you when beginning to rehabilitate your injury. Your symptoms may resolve with or without further involvement from your physician, physical therapist or athletic trainer. While completing these exercises, remember:   Restoring tissue flexibility helps normal motion to return to the joints. This allows healthier, less painful movement and activity.  An effective stretch should be held for at least 30 seconds.  A stretch should never be painful. You should only feel a gentle lengthening or release in the stretched tissue. FLEXION RANGE OF MOTION AND STRETCHING EXERCISES: STRETCH - Flexion, Single Knee to Chest   Lie on a firm bed or floor  with both legs extended in front of you.  Keeping one leg in contact with the floor, bring your opposite knee to your chest. Hold your leg in place by either grabbing behind your thigh or at your knee.  Pull until you feel a gentle stretch in your low back. Hold __________ seconds.  Slowly release your grasp and repeat the exercise with the opposite side. Repeat __________ times. Complete this exercise __________ times per day.  STRETCH - Flexion, Double Knee to Chest  Lie on a firm bed or floor with both legs extended in front of you.  Keeping one leg in contact with the floor, bring your opposite knee to your chest.  Tense your stomach muscles to support your back and then lift your other knee to your chest. Hold your legs in place by either grabbing behind your thighs or at your knees.  Pull both knees toward your chest until you feel a gentle stretch in your low back. Hold __________ seconds.  Tense your stomach muscles and slowly return one leg at a time to the floor. Repeat __________ times. Complete this exercise __________ times per day.  STRETCH - Low Trunk Rotation   Lie on a firm bed or floor. Keeping your legs in front of you, bend your knees so they are both pointed toward the ceiling and your feet are flat on the floor.  Extend your arms out to the side. This will stabilize your upper body by keeping your shoulders in contact with the floor.  Gently and slowly drop both knees together to one side until   you feel a gentle stretch in your low back. Hold for __________ seconds.  Tense your stomach muscles to support your low back as you bring your knees back to the starting position. Repeat the exercise to the other side. Repeat __________ times. Complete this exercise __________ times per day  EXTENSION RANGE OF MOTION AND FLEXIBILITY EXERCISES: STRETCH - Extension, Prone on Elbows  Lie on your stomach on the floor, a bed will be too soft. Place your palms about shoulder  width apart and at the height of your head.  Place your elbows under your shoulders. If this is too painful, stack pillows under your chest.  Allow your body to relax so that your hips drop lower and make contact more completely with the floor.  Hold this position for __________ seconds.  Slowly return to lying flat on the floor. Repeat __________ times. Complete this exercise __________ times per day.  RANGE OF MOTION - Extension, Prone Press Ups  Lie on your stomach on the floor, a bed will be too soft. Place your palms about shoulder width apart and at the height of your head.  Keeping your back as relaxed as possible, slowly straighten your elbows while keeping your hips on the floor. You may adjust the placement of your hands to maximize your comfort. As you gain motion, your hands will come more underneath your shoulders.  Hold this position __________ seconds.  Slowly return to lying flat on the floor. Repeat __________ times. Complete this exercise __________ times per day.  STRENGTHENING EXERCISES - Sciatica  These exercises may help you when beginning to rehabilitate your injury. These exercises should be done near your "sweet spot." This is the neutral, low-back arch, somewhere between fully rounded and fully arched, that is your least painful position. When performed in this safe range of motion, these exercises can be used for people who have either a flexion or extension based injury. These exercises may resolve your symptoms with or without further involvement from your physician, physical therapist or athletic trainer. While completing these exercises, remember:   Muscles can gain both the endurance and the strength needed for everyday activities through controlled exercises.  Complete these exercises as instructed by your physician, physical therapist or athletic trainer. Progress with the resistance and repetition exercises only as your caregiver advises.  You may  experience muscle soreness or fatigue, but the pain or discomfort you are trying to eliminate should never worsen during these exercises. If this pain does worsen, stop and make certain you are following the directions exactly. If the pain is still present after adjustments, discontinue the exercise until you can discuss the trouble with your clinician. STRENGTHENING - Deep Abdominals, Pelvic Tilt   Lie on a firm bed or floor. Keeping your legs in front of you, bend your knees so they are both pointed toward the ceiling and your feet are flat on the floor.  Tense your lower abdominal muscles to press your low back into the floor. This motion will rotate your pelvis so that your tail bone is scooping upwards rather than pointing at your feet or into the floor.  With a gentle tension and even breathing, hold this position for __________ seconds. Repeat __________ times. Complete this exercise __________ times per day.  STRENGTHENING - Abdominals, Crunches   Lie on a firm bed or floor. Keeping your legs in front of you, bend your knees so they are both pointed toward the ceiling and your feet are flat on the   floor. Cross your arms over your chest.  Slightly tip your chin down without bending your neck.  Tense your abdominals and slowly lift your trunk high enough to just clear your shoulder blades. Lifting higher can put excessive stress on the low back and does not further strengthen your abdominal muscles.  Control your return to the starting position. Repeat __________ times. Complete this exercise __________ times per day.  STRENGTHENING - Quadruped, Opposite UE/LE Lift  Assume a hands and knees position on a firm surface. Keep your hands under your shoulders and your knees under your hips. You may place padding under your knees for comfort.  Find your neutral spine and gently tense your abdominal muscles so that you can maintain this position. Your shoulders and hips should form a rectangle  that is parallel with the floor and is not twisted.  Keeping your trunk steady, lift your right hand no higher than your shoulder and then your left leg no higher than your hip. Make sure you are not holding your breath. Hold this position __________ seconds.  Continuing to keep your abdominal muscles tense and your back steady, slowly return to your starting position. Repeat with the opposite arm and leg. Repeat __________ times. Complete this exercise __________ times per day.  STRENGTHENING - Abdominals and Quadriceps, Straight Leg Raise   Lie on a firm bed or floor with both legs extended in front of you.  Keeping one leg in contact with the floor, bend the other knee so that your foot can rest flat on the floor.  Find your neutral spine, and tense your abdominal muscles to maintain your spinal position throughout the exercise.  Slowly lift your straight leg off the floor about 6 inches for a count of 15, making sure to not hold your breath.  Still keeping your neutral spine, slowly lower your leg all the way to the floor. Repeat this exercise with each leg __________ times. Complete this exercise __________ times per day. POSTURE AND BODY MECHANICS CONSIDERATIONS - Sciatica Keeping correct posture when sitting, standing or completing your activities will reduce the stress put on different body tissues, allowing injured tissues a chance to heal and limiting painful experiences. The following are general guidelines for improved posture. Your physician or physical therapist will provide you with any instructions specific to your needs. While reading these guidelines, remember:  The exercises prescribed by your provider will help you have the flexibility and strength to maintain correct postures.  The correct posture provides the optimal environment for your joints to work. All of your joints have less wear and tear when properly supported by a spine with good posture. This means you will  experience a healthier, less painful body.  Correct posture must be practiced with all of your activities, especially prolonged sitting and standing. Correct posture is as important when doing repetitive low-stress activities (typing) as it is when doing a single heavy-load activity (lifting). RESTING POSITIONS Consider which positions are most painful for you when choosing a resting position. If you have pain with flexion-based activities (sitting, bending, stooping, squatting), choose a position that allows you to rest in a less flexed posture. You would want to avoid curling into a fetal position on your side. If your pain worsens with extension-based activities (prolonged standing, working overhead), avoid resting in an extended position such as sleeping on your stomach. Most people will find more comfort when they rest with their spine in a more neutral position, neither too rounded nor too   arched. Lying on a non-sagging bed on your side with a pillow between your knees, or on your back with a pillow under your knees will often provide some relief. Keep in mind, being in any one position for a prolonged period of time, no matter how correct your posture, can still lead to stiffness. PROPER SITTING POSTURE In order to minimize stress and discomfort on your spine, you must sit with correct posture Sitting with good posture should be effortless for a healthy body. Returning to good posture is a gradual process. Many people can work toward this most comfortably by using various supports until they have the flexibility and strength to maintain this posture on their own. When sitting with proper posture, your ears will fall over your shoulders and your shoulders will fall over your hips. You should use the back of the chair to support your upper back. Your low back will be in a neutral position, just slightly arched. You may place a small pillow or folded towel at the base of your low back for support.  When  working at a desk, create an environment that supports good, upright posture. Without extra support, muscles fatigue and lead to excessive strain on joints and other tissues. Keep these recommendations in mind: CHAIR:   A chair should be able to slide under your desk when your back makes contact with the back of the chair. This allows you to work closely.  The chair's height should allow your eyes to be level with the upper part of your monitor and your hands to be slightly lower than your elbows. BODY POSITION  Your feet should make contact with the floor. If this is not possible, use a foot rest.  Keep your ears over your shoulders. This will reduce stress on your neck and low back. INCORRECT SITTING POSTURES   If you are feeling tired and unable to assume a healthy sitting posture, do not slouch or slump. This puts excessive strain on your back tissues, causing more damage and pain. Healthier options include:  Using more support, like a lumbar pillow.  Switching tasks to something that requires you to be upright or walking.  Talking a brief walk.  Lying down to rest in a neutral-spine position. PROLONGED STANDING WHILE SLIGHTLY LEANING FORWARD  When completing a task that requires you to lean forward while standing in one place for a long time, place either foot up on a stationary 2-4 inch high object to help maintain the best posture. When both feet are on the ground, the low back tends to lose its slight inward curve. If this curve flattens (or becomes too large), then the back and your other joints will experience too much stress, fatigue more quickly and can cause pain.  CORRECT STANDING POSTURES Proper standing posture should be assumed with all daily activities, even if they only take a few moments, like when brushing your teeth. As in sitting, your ears should fall over your shoulders and your shoulders should fall over your hips. You should keep a slight tension in your abdominal  muscles to brace your spine. Your tailbone should point down to the ground, not behind your body, resulting in an over-extended swayback posture.  INCORRECT STANDING POSTURES  Common incorrect standing postures include a forward head, locked knees and/or an excessive swayback. WALKING Walk with an upright posture. Your ears, shoulders and hips should all line-up. PROLONGED ACTIVITY IN A FLEXED POSITION When completing a task that requires you to bend forward   at your waist or lean over a low surface, try to find a way to stabilize 3 of 4 of your limbs. You can place a hand or elbow on your thigh or rest a knee on the surface you are reaching across. This will provide you more stability so that your muscles do not fatigue as quickly. By keeping your knees relaxed, or slightly bent, you will also reduce stress across your low back. CORRECT LIFTING TECHNIQUES DO :   Assume a wide stance. This will provide you more stability and the opportunity to get as close as possible to the object which you are lifting.  Tense your abdominals to brace your spine; then bend at the knees and hips. Keeping your back locked in a neutral-spine position, lift using your leg muscles. Lift with your legs, keeping your back straight.  Test the weight of unknown objects before attempting to lift them.  Try to keep your elbows locked down at your sides in order get the best strength from your shoulders when carrying an object.  Always ask for help when lifting heavy or awkward objects. INCORRECT LIFTING TECHNIQUES DO NOT:   Lock your knees when lifting, even if it is a small object.  Bend and twist. Pivot at your feet or move your feet when needing to change directions.  Assume that you cannot safely pick up a paperclip without proper posture. Document Released: 01/28/2005 Document Revised: 06/14/2013 Document Reviewed: 05/12/2008 [facility] Patient Information 2015 [place], [place]. This information is not intended to  replace advice given to you by your health care provider. Make sure you discuss any questions you have with your health care provider. Back Pain in Pregnancy Back pain during pregnancy is common. It happens in about half of all pregnancies. It is important for you and your baby that you remain active during your pregnancy.If you feel that back pain is not allowing you to remain active or sleep well, it is time to see your caregiver. Back pain may be caused by several factors related to changes during your pregnancy.Fortunately, unless you had trouble with your back before your pregnancy, the pain is likely to get better after you deliver. Low back pain usually occurs between the fifth and seventh months of pregnancy. It can, however, happen in the first couple months. Factors that increase the risk of back problems include:   Previous back problems.  Injury to your back.  Having twins or multiple births.  A chronic cough.  Stress.  Job-related repetitive motions.  Muscle or spinal disease in the back.  Family history of back problems, ruptured (herniated) discs, or osteoporosis.  Depression, anxiety, and panic attacks. CAUSES   When you are pregnant, your body produces a hormone called relaxin. This hormonemakes the ligaments connecting the low back and pubic bones more flexible. This flexibility allows the baby to be delivered more easily. When your ligaments are loose, your muscles need to work harder to support your back. Soreness in your back can come from tired muscles. Soreness can also come from back tissues that are irritated since they are receiving less support.  As the baby grows, it puts pressure on the nerves and blood vessels in your pelvis. This can cause back pain.  As the baby grows and gets heavier during pregnancy, the uterus pushes the stomach muscles forward and changes your center of gravity. This makes your back muscles work harder to maintain good  posture. SYMPTOMS  Lumbar pain during pregnancy Lumbar pain during pregnancy  usually occurs at or above the waist in the center of the back. There may be pain and numbness that radiates into your leg or foot. This is similar to low back pain experienced by non-pregnant women. It usually increases with sitting for long periods of time, standing, or repetitive lifting. Tenderness may also be present in the muscles along your upper back. Posterior pelvic pain during pregnancy Pain in the back of the pelvis is more common than lumbar pain in pregnancy. It is a deep pain felt in your side at the waistline, or across the tailbone (sacrum), or in both places. You may have pain on one or both sides. This pain can also go into the buttocks and backs of the upper thighs. Pubic and groin pain may also be present. The pain does not quickly resolve with rest, and morning stiffness may also be present. Pelvic pain during pregnancy can be brought on by most activities. A high level of fitness before and during pregnancy may or may not prevent this problem. Labor pain is usually 1 to 2 minutes apart, lasts for about 1 minute, and involves a bearing down feeling or pressure in your pelvis. However, if you are at term with the pregnancy, constant low back pain can be the beginning of early labor, and you should be aware of this. DIAGNOSIS  X-rays of the back should not be done during the first 12 to 14 weeks of the pregnancy and only when absolutely necessary during the rest of the pregnancy. MRIs do not give off radiation and are safe during pregnancy. MRIs also should only be done when absolutely necessary. HOME CARE INSTRUCTIONS  Exercise as directed by your caregiver. Exercise is the most effective way to prevent or manage back pain. If you have a back problem, it is especially important to avoid sports that require sudden body movements. Swimming and walking are great activities.  Do not stand in one place for long  periods of time.  Do not wear high heels.  Sit in chairs with good posture. Use a pillow on your lower back if necessary. Make sure your head rests over your shoulders and is not hanging forward.  Try sleeping on your side, preferably the left side, with a pillow or two between your legs. If you are sore after a night's rest, your bedmay betoo soft.Try placing a board between your mattress and box spring.  Listen to your body when lifting.If you are experiencing pain, ask for help or try bending yourknees more so you can use your leg muscles rather than your back muscles. Squat down when picking up something from the floor. Do not bend over.  Eat a healthy diet. Try to gain weight within your caregiver's recommendations.  Use heat or cold packs 3 to 4 times a day for 15 minutes to help with the pain.  Only take over-the-counter or prescription medicines for pain, discomfort, or fever as directed by your caregiver. Sudden (acute) back pain  Use bed rest for only the most extreme, acute episodes of back pain. Prolonged bed rest over 48 hours will aggravate your condition.  Ice is very effective for acute conditions.  Put ice in a plastic bag.  Place a towel between your skin and the bag.  Leave the ice on for 10 to 20 minutes every 2 hours, or as needed.  Using heat packs for 30 minutes prior to activities is also helpful. Continued back pain See your caregiver if you have continued problems.  Your caregiver can help or refer you for appropriate physical therapy. With conditioning, most back problems can be avoided. Sometimes, a more serious issue may be the cause of back pain. You should be seen right away if new problems seem to be developing. Your caregiver may recommend:  A maternity girdle.  An elastic sling.  A back brace.  A massage therapist or acupuncture. SEEK MEDICAL CARE IF:   You are not able to do most of your daily activities, even when taking the pain  medicine you were given.  You need a referral to a physical therapist or chiropractor.  You want to try acupuncture. SEEK IMMEDIATE MEDICAL CARE IF:  You develop numbness, tingling, weakness, or problems with the use of your arms or legs.  You develop severe back pain that is no longer relieved with medicines.  You have a sudden change in bowel or bladder control.  You have increasing pain in other areas of the body.  You develop shortness of breath, dizziness, or fainting.  You develop nausea, vomiting, or sweating.  You have back pain which is similar to labor pains.  You have back pain along with your water breaking or vaginal bleeding.  You have back pain or numbness that travels down your leg.  Your back pain developed after you fell.  You develop pain on one side of your back. You may have a kidney stone.  You see blood in your urine. You may have a bladder infection or kidney stone.  You have back pain with blisters. You may have shingles. Back pain is fairly common during pregnancy but should not be accepted as just part of the process. Back pain should always be treated as soon as possible. This will make your pregnancy as pleasant as possible. Document Released: 05/08/2005 Document Revised: 04/22/2011 Document Reviewed: 06/19/2010 [facility] Patient Information 2015 [employer] [place]. This information is not intended to replace advice given to you by your health care provider. Make sure you discuss any questions you have with your health care provider.

## 2014-05-03 NOTE — Progress Notes (Signed)
G1P0 1432w6d Estimated Date of Delivery: 05/18/14  Last menstrual period 07/29/2013.   BP weight and urine results all reviewed and noted.  Please refer to the obstetrical flow sheet for the fundal height and fetal heart rate documentation:  Patient reports good fetal movement, denies any bleeding and no rupture of membranes symptoms or regular contractions. Patient is without complaints other than normal pregnancy complaints All questions were answered.  Plan:  Continued routine obstetrical care,   Follow up in 1 weeks for OB appointment,

## 2014-05-10 ENCOUNTER — Encounter: Payer: Self-pay | Admitting: Women's Health

## 2014-05-10 ENCOUNTER — Ambulatory Visit (INDEPENDENT_AMBULATORY_CARE_PROVIDER_SITE_OTHER): Payer: Medicaid Other | Admitting: Women's Health

## 2014-05-10 VITALS — BP 100/60 | HR 84 | Wt 187.0 lb

## 2014-05-10 DIAGNOSIS — Z331 Pregnant state, incidental: Secondary | ICD-10-CM

## 2014-05-10 DIAGNOSIS — Z3403 Encounter for supervision of normal first pregnancy, third trimester: Secondary | ICD-10-CM

## 2014-05-10 DIAGNOSIS — Z1389 Encounter for screening for other disorder: Secondary | ICD-10-CM

## 2014-05-10 DIAGNOSIS — B373 Candidiasis of vulva and vagina: Secondary | ICD-10-CM | POA: Diagnosis not present

## 2014-05-10 DIAGNOSIS — L292 Pruritus vulvae: Secondary | ICD-10-CM

## 2014-05-10 DIAGNOSIS — B3731 Acute candidiasis of vulva and vagina: Secondary | ICD-10-CM

## 2014-05-10 LAB — POCT WET PREP (WET MOUNT): Clue Cells Wet Prep Whiff POC: NEGATIVE

## 2014-05-10 LAB — POCT URINALYSIS DIPSTICK
Blood, UA: NEGATIVE
Glucose, UA: NEGATIVE
Ketones, UA: NEGATIVE
Leukocytes, UA: NEGATIVE
Nitrite, UA: NEGATIVE
Protein, UA: NEGATIVE

## 2014-05-10 MED ORDER — TERCONAZOLE 0.4 % VA CREA
1.0000 | TOPICAL_CREAM | Freq: Every day | VAGINAL | Status: DC
Start: 2014-05-10 — End: 2014-07-07

## 2014-05-10 NOTE — Progress Notes (Signed)
Pt denies any problems or concerns at this time.  

## 2014-05-10 NOTE — Progress Notes (Signed)
Low-risk OB appointment G1P0 8160w6d Estimated Date of Delivery: 05/18/14 BP 100/60 mmHg  Pulse 84  Wt 187 lb (84.823 kg)  LMP 07/29/2013  BP, weight, and urine reviewed.  Refer to obstetrical flow sheet for FH & FHR.  Reports good fm.  Denies regular uc's, lof, vb, or uti s/s. No complaints. Abd: 2 long red scratches on Lt side abd- states dog jumped on her abd 'awhile ago'- has been putting lotion on them.  Multiple small red circular bumps on bilateral legs- don't itch- has been using hydrocortisone cream which is helping- no one else in house has bumps.  SVE per request: 1/th/-3, vtx Large amount thick yellow d/c on glove- pt reports vulvar itching Wet prep: +yeast, rx terazole 7 Reviewed labor s/s, fkc. Plan:  Continue routine obstetrical care  F/U in 1wk for OB appointment

## 2014-05-10 NOTE — Patient Instructions (Signed)
Call the office [phone]) or go to [facility] if:  You begin to have strong, frequent contractions  Your water breaks.  Sometimes it is a big gush of fluid, sometimes it is just a trickle that keeps getting your panties wet or running down your legs  You have vaginal bleeding.  It is normal to have a small amount of spotting if your cervix was checked.   You don't feel your baby moving like normal.  If you don't, get you something to eat and drink and lay down and focus on feeling your baby move.  You should feel at least 10 movements in 2 hours.  If you don't, you should call the office or go to [facility].    [facility]: [phone]  [facility] Contractions of the uterus can occur throughout pregnancy. Contractions are not always a sign that you are in labor.  WHAT ARE BRAXTON HICKS CONTRACTIONS?  Contractions that occur before labor are called Braxton Hicks contractions, or false labor. Toward the end of pregnancy (32-34 weeks), these contractions can develop more often and may become more forceful. This is not true labor because these contractions do not result in opening (dilatation) and thinning of the cervix. They are sometimes difficult to tell apart from true labor because these contractions can be forceful and people have different pain tolerances. You should not feel embarrassed if you go to the hospital with false labor. Sometimes, the only way to tell if you are in true labor is for your health care provider to look for changes in the cervix. If there are no prenatal problems or other health problems associated with the pregnancy, it is completely safe to be sent home with false labor and await the onset of true labor. HOW CAN YOU TELL THE DIFFERENCE BETWEEN TRUE AND FALSE LABOR? False Labor  The contractions of false labor are usually shorter and not as hard as those of true labor.   The contractions are usually irregular.   The  contractions are often felt in the front of the lower abdomen and in the groin.   The contractions may go away when you walk around or change positions while lying down.   The contractions get weaker and are shorter lasting as time goes on.   The contractions do not usually become progressively stronger, regular, and closer together as with true labor.  True Labor  Contractions in true labor last 30-70 seconds, become very regular, usually become more intense, and increase in frequency.   The contractions do not go away with walking.   The discomfort is usually felt in the top of the uterus and spreads to the lower abdomen and low back.   True labor can be determined by your health care provider with an exam. This will show that the cervix is dilating and getting thinner.  WHAT TO REMEMBER  Keep up with your usual exercises and follow other instructions given by your health care provider.   Take medicines as directed by your health care provider.   Keep your regular prenatal appointments.   Eat and drink lightly if you think you are going into labor.   If Braxton Hicks contractions are making you uncomfortable:   Change your position from lying down or resting to walking, or from walking to resting.   Sit and rest in a tub of warm water.   Drink 2-3 glasses of water. Dehydration may cause these contractions.   Do slow and deep breathing  several times an hour.  WHEN SHOULD I SEEK IMMEDIATE MEDICAL CARE? Seek immediate medical care if:  Your contractions become stronger, more regular, and closer together.   You have fluid leaking or gushing from your vagina.   You have a fever.   You pass blood-tinged mucus.   You have vaginal bleeding.   You have continuous abdominal pain.   You have low back pain that you never had before.   You feel your baby's head pushing down and causing pelvic pressure.   Your baby is not moving as much as it used  to.  Document Released: 01/28/2005 Document Revised: 02/02/2013 Document Reviewed: 11/09/2012 [facility] Patient Information 2015 [place], [place]. This information is not intended to replace advice given to you by your health care provider. Make sure you discuss any questions you have with your health care provider.

## 2014-05-17 ENCOUNTER — Ambulatory Visit (INDEPENDENT_AMBULATORY_CARE_PROVIDER_SITE_OTHER): Payer: Medicaid Other | Admitting: Women's Health

## 2014-05-17 ENCOUNTER — Encounter: Payer: Self-pay | Admitting: Women's Health

## 2014-05-17 ENCOUNTER — Telehealth (HOSPITAL_COMMUNITY): Payer: Self-pay | Admitting: *Deleted

## 2014-05-17 ENCOUNTER — Encounter (HOSPITAL_COMMUNITY): Payer: Self-pay | Admitting: *Deleted

## 2014-05-17 VITALS — BP 110/60 | HR 80 | Wt 190.0 lb

## 2014-05-17 DIAGNOSIS — Z331 Pregnant state, incidental: Secondary | ICD-10-CM

## 2014-05-17 DIAGNOSIS — Z3403 Encounter for supervision of normal first pregnancy, third trimester: Secondary | ICD-10-CM | POA: Diagnosis not present

## 2014-05-17 DIAGNOSIS — O2686 Pruritic urticarial papules and plaques of pregnancy (PUPPP): Secondary | ICD-10-CM | POA: Insufficient documentation

## 2014-05-17 DIAGNOSIS — Z1389 Encounter for screening for other disorder: Secondary | ICD-10-CM

## 2014-05-17 LAB — POCT URINALYSIS DIPSTICK
Blood, UA: NEGATIVE
Glucose, UA: NEGATIVE
KETONES UA: NEGATIVE
Nitrite, UA: NEGATIVE

## 2014-05-17 MED ORDER — PREDNISONE 20 MG PO TABS: 40.0000 mg | ORAL_TABLET | Freq: Every day | ORAL | Status: DC

## 2014-05-17 NOTE — Patient Instructions (Addendum)
Your induction is scheduled for 4/13 @ 7:00 am. [facility] and let them know you are there to be induced. They will send someone from Labor & Delivery to come get you.  Call the office [phone]) or go to [facility] if:  You begin to have strong, frequent contractions  Your water breaks.  Sometimes it is a big gush of fluid, sometimes it is just a trickle that keeps getting your panties wet or running down your legs  You have vaginal bleeding.  It is normal to have a small amount of spotting if your cervix was checked.   You don't feel your baby moving like normal.  If you don't, get you something to eat and drink and lay down and focus on feeling your baby move.  You should feel at least 10 movements in 2 hours.  If you don't, you should call the office or go to [facility].   [facility] Contractions of the uterus can occur throughout pregnancy. Contractions are not always a sign that you are in labor.  WHAT ARE BRAXTON HICKS CONTRACTIONS?  Contractions that occur before labor are called Braxton Hicks contractions, or false labor. Toward the end of pregnancy (32-34 weeks), these contractions can develop more often and may become more forceful. This is not true labor because these contractions do not result in opening (dilatation) and thinning of the cervix. They are sometimes difficult to tell apart from true labor because these contractions can be forceful and people have different pain tolerances. You should not feel embarrassed if you go to the hospital with false labor. Sometimes, the only way to tell if you are in true labor is for your health care provider to look for changes in the cervix. If there are no prenatal problems or other health problems associated with the pregnancy, it is completely safe to be sent home with false labor and await the onset of true labor. HOW CAN YOU TELL THE DIFFERENCE BETWEEN  TRUE AND FALSE LABOR? False Labor  The contractions of false labor are usually shorter and not as hard as those of true labor.   The contractions are usually irregular.   The contractions are often felt in the front of the lower abdomen and in the groin.   The contractions may go away when you walk around or change positions while lying down.   The contractions get weaker and are shorter lasting as time goes on.   The contractions do not usually become progressively stronger, regular, and closer together as with true labor.  True Labor  Contractions in true labor last 30-70 seconds, become very regular, usually become more intense, and increase in frequency.   The contractions do not go away with walking.   The discomfort is usually felt in the top of the uterus and spreads to the lower abdomen and low back.   True labor can be determined by your health care provider with an exam. This will show that the cervix is dilating and getting thinner.  WHAT TO REMEMBER  Keep up with your usual exercises and follow other instructions given by your health care provider.   Take medicines as directed by your health care provider.   Keep your regular prenatal appointments.   Eat and drink lightly if you think you are going into labor.   If Braxton Hicks contractions are making you uncomfortable:   Change your position from lying down or resting to  walking, or from walking to resting.   Sit and rest in a tub of warm water.   Drink 2-3 glasses of water. Dehydration may cause these contractions.   Do slow and deep breathing several times an hour.  WHEN SHOULD I SEEK IMMEDIATE MEDICAL CARE? Seek immediate medical care if:  Your contractions become stronger, more regular, and closer together.   You have fluid leaking or gushing from your vagina.   You have a fever.   You pass blood-tinged mucus.   You have vaginal bleeding.   You have continuous abdominal  pain.   You have low back pain that you never had before.   You feel your baby's head pushing down and causing pelvic pressure.   Your baby is not moving as much as it used to.  Document Released: 01/28/2005 Document Revised: 02/02/2013 Document Reviewed: 11/09/2012 [facility] Patient Information 2015 [place], [place]. This information is not intended to replace advice given to you by your health care provider. Make sure you discuss any questions you have with your health care provider.  Pruritic Urticarial Papules and Plaques of Pregnancy When you are pregnant, your body changes in many ways. That includes the skin. Rashes sometimes develop. One skin rash that can happen during pregnancy is called pruritic urticarial papules and plaques of pregnancy (PUPPP). The small red bumps sometimes form large plaques. These are very itchy. The rash usually appears in the last few weeks of pregnancy during the third trimester. Sometimes, it can occur shortly after giving birth. It goes away shortly after your baby is born. It does not harm you or your baby and will not leave scars on your skin. PUPPP is most common in first pregnancies or in those involving more than one baby. It usually will not return during later pregnancies. CAUSES  The exact cause is unknown. However, it may be related to your skin stretching rapidly due to pregnancy.  SYMPTOMS  PUPPP symptoms include a very itchy rash. The rash often looks red and raised and is most often seen on the abdomen. It can spread to the legs, thighs, or arms. Sometimes tiny blisters form in the center of the rash patches. The skin around the rash is often pale. DIAGNOSIS  To decide if you have PUPPP, your health care provider will perform a physical exam and ask questions about your symptoms. He or she may order blood tests to rule out other causes of the rash. TREATMENT  The goal is to stop the itching and keep the rash from spreading. Usually, a cream is  used to do this. However, treatment varies. Common options include medicines that relieve or lessen itching. Some medicines may be in the form of a cream or ointment, while others you may take by mouth (orally). The medicines are either corticosteroids or antihistamines. Treatment helps nearly all women with this rash. The creams, ointments, or pills should make your skin feel better fairly quickly.  HOME CARE INSTRUCTIONS   Only take over-the-counter or prescription medicines as directed by your health care provider.  Apply any creams as directed by your health care provider.  Do not scratch the rash.  Wear loose clothing.  Keep all follow-up appointments with your health care provider. SEEK MEDICAL CARE IF:   The itching does not go away after treatment.  Your rash continues to spread.  You are unable to sleep because of the irritation. Document Released: 04/24/2009 Document Revised: 09/30/2012 Document Reviewed: 07/12/2012 [facility] Patient Information 2015 [place], [place]. This  information is not intended to replace advice given to you by your health care provider. Make sure you discuss any questions you have with your health care provider.  

## 2014-05-17 NOTE — Progress Notes (Signed)
Low-risk OB appointment G1P0 3658w6d Estimated Date of Delivery: 05/18/14 BP 110/60 mmHg  Pulse 80  Wt 190 lb (86.183 kg)  LMP 07/29/2013  BP, weight, and urine reviewed.  Refer to obstetrical flow sheet for FH & FHR.  Reports good fm.  Denies regular uc's, lof, vb, or uti s/s. Itchy red rash on abd & legs, trying everything she knows to otc w/o relief. Diffuse erythematous rash in striae and lower abd, and on bilateral lower legs c/w PUPPS, co-exam w/ LHE, definitely PUPPs- rx prednisone 40mg  daily x 10d, calimine lotion prn, avoid hot water SVE per request: 1.5/50/-3, vtx, wants membrane stripping- started but was too uncomfortable so pt wanted to stop Reviewed labor s/s, fkc. IOL 4/13 @ 0700 for PD if needed Plan:  Continue routine obstetrical care  F/U in 1wk for OB appointment

## 2014-05-17 NOTE — Telephone Encounter (Signed)
Preadmission screen  

## 2014-05-20 ENCOUNTER — Telehealth: Payer: Self-pay | Admitting: *Deleted

## 2014-05-20 NOTE — Telephone Encounter (Signed)
Pt Dx with PUPP at last appt calimine lotion not helping with the itching, Is there an alternative. Pt states she is miserable!

## 2014-05-20 NOTE — Telephone Encounter (Signed)
Per my discussion with Willy Eddyashley travis

## 2014-05-20 NOTE — Telephone Encounter (Signed)
I spoke with Dr. Despina HiddenEure before calling the pt and he advised the pt is already taking prednisone and that she knows that the itching isn't going to get better until she delivers. Dr. Despina HiddenEure advised the pt take 25-50mg  of Benadryl every 4-6 hours as needed for the itching. I called the pt and advised her of this and she verbalized understanding.

## 2014-05-21 ENCOUNTER — Inpatient Hospital Stay (HOSPITAL_COMMUNITY): Payer: Medicaid Other | Admitting: Anesthesiology

## 2014-05-21 ENCOUNTER — Inpatient Hospital Stay (HOSPITAL_COMMUNITY)
Admission: AD | Admit: 2014-05-21 | Discharge: 2014-05-23 | DRG: 774 | Disposition: A | Discharge status: Home / Self Care | Payer: Medicaid Other | Source: Ambulatory Visit | Attending: Family Medicine | Admitting: Family Medicine

## 2014-05-21 ENCOUNTER — Encounter (HOSPITAL_COMMUNITY): Payer: Self-pay | Admitting: *Deleted

## 2014-05-21 DIAGNOSIS — Z3403 Encounter for supervision of normal first pregnancy, third trimester: Secondary | ICD-10-CM

## 2014-05-21 DIAGNOSIS — O2686 Pruritic urticarial papules and plaques of pregnancy (PUPPP): Secondary | ICD-10-CM

## 2014-05-21 DIAGNOSIS — L298 Other pruritus: Secondary | ICD-10-CM | POA: Diagnosis present

## 2014-05-21 DIAGNOSIS — O9832 Other infections with a predominantly sexual mode of transmission complicating childbirth: Secondary | ICD-10-CM | POA: Diagnosis present

## 2014-05-21 DIAGNOSIS — F17210 Nicotine dependence, cigarettes, uncomplicated: Secondary | ICD-10-CM | POA: Diagnosis present

## 2014-05-21 DIAGNOSIS — O9972 Diseases of the skin and subcutaneous tissue complicating childbirth: Secondary | ICD-10-CM | POA: Diagnosis present

## 2014-05-21 DIAGNOSIS — O99334 Smoking (tobacco) complicating childbirth: Secondary | ICD-10-CM | POA: Diagnosis present

## 2014-05-21 DIAGNOSIS — O411230 Chorioamnionitis, third trimester, not applicable or unspecified: Secondary | ICD-10-CM | POA: Diagnosis present

## 2014-05-21 DIAGNOSIS — A6000 Herpesviral infection of urogenital system, unspecified: Secondary | ICD-10-CM | POA: Diagnosis present

## 2014-05-21 DIAGNOSIS — Z3A40 40 weeks gestation of pregnancy: Secondary | ICD-10-CM | POA: Diagnosis present

## 2014-05-21 DIAGNOSIS — IMO0001 Reserved for inherently not codable concepts without codable children: Secondary | ICD-10-CM

## 2014-05-21 LAB — TYPE AND SCREEN
ABO/RH(D): A POS
Antibody Screen: NEGATIVE

## 2014-05-21 LAB — CBC
HCT: 35.6 % — ABNORMAL LOW (ref 36.0–46.0)
HEMOGLOBIN: 12.3 g/dL (ref 12.0–15.0)
MCH: 31.2 pg (ref 26.0–34.0)
MCHC: 34.6 g/dL (ref 30.0–36.0)
MCV: 90.4 fL (ref 78.0–100.0)
PLATELETS: 172 10*3/uL (ref 150–400)
RBC: 3.94 MIL/uL (ref 3.87–5.11)
RDW: 13.7 % (ref 11.5–15.5)
WBC: 17.6 10*3/uL — ABNORMAL HIGH (ref 4.0–10.5)

## 2014-05-21 LAB — ABO/RH: ABO/RH(D): A POS

## 2014-05-21 MED ORDER — HYDROXYZINE HCL 50 MG PO TABS
50.0000 mg | ORAL_TABLET | Freq: Four times a day (QID) | ORAL | Status: DC | PRN
  Filled 2014-05-21: qty 1

## 2014-05-21 MED ORDER — ONDANSETRON HCL 4 MG/2ML IJ SOLN
4.0000 mg | Freq: Four times a day (QID) | INTRAMUSCULAR | Status: DC | PRN
  Administered 2014-05-22: 4 mg via INTRAVENOUS
  Filled 2014-05-21: qty 2

## 2014-05-21 MED ORDER — LACTATED RINGERS IV SOLN: 500.0000 mL | Freq: Once | INTRAVENOUS | Status: DC

## 2014-05-21 MED ORDER — LIDOCAINE HCL (PF) 1 % IJ SOLN
30.0000 mL | INTRAMUSCULAR | Status: DC | PRN
  Filled 2014-05-21: qty 30

## 2014-05-21 MED ORDER — FENTANYL 2.5 MCG/ML BUPIVACAINE 1/10 % EPIDURAL INFUSION (WH - ANES)
14.0000 mL/h | INTRAMUSCULAR | Status: DC | PRN
  Administered 2014-05-21 – 2014-05-22 (×3): 14 mL/h via EPIDURAL
  Filled 2014-05-21 (×4): qty 125

## 2014-05-21 MED ORDER — EPHEDRINE 5 MG/ML INJ
10.0000 mg | INTRAVENOUS | Status: DC | PRN
  Filled 2014-05-21: qty 2

## 2014-05-21 MED ORDER — ACETAMINOPHEN 325 MG PO TABS
650.0000 mg | ORAL_TABLET | ORAL | Status: DC | PRN
  Administered 2014-05-21: 650 mg via ORAL
  Administered 2014-05-22: 325 mg via ORAL
  Filled 2014-05-21 (×2): qty 2

## 2014-05-21 MED ORDER — LACTATED RINGERS IV SOLN
INTRAVENOUS | Status: DC
  Administered 2014-05-21 (×2): via INTRAVENOUS

## 2014-05-21 MED ORDER — OXYCODONE-ACETAMINOPHEN 5-325 MG PO TABS: 1.0000 | ORAL_TABLET | ORAL | Status: DC | PRN

## 2014-05-21 MED ORDER — OXYTOCIN 40 UNITS IN LACTATED RINGERS INFUSION - SIMPLE MED
62.5000 mL/h | INTRAVENOUS | Status: DC
  Filled 2014-05-21: qty 1000

## 2014-05-21 MED ORDER — OXYCODONE-ACETAMINOPHEN 5-325 MG PO TABS: 2.0000 | ORAL_TABLET | ORAL | Status: DC | PRN

## 2014-05-21 MED ORDER — PHENYLEPHRINE 40 MCG/ML (10ML) SYRINGE FOR IV PUSH (FOR BLOOD PRESSURE SUPPORT)
80.0000 ug | PREFILLED_SYRINGE | INTRAVENOUS | Status: DC | PRN
  Filled 2014-05-21: qty 2
  Filled 2014-05-21 (×2): qty 20

## 2014-05-21 MED ORDER — TERBUTALINE SULFATE 1 MG/ML IJ SOLN: 0.2500 mg | Freq: Once | INTRAMUSCULAR | Status: AC | PRN

## 2014-05-21 MED ORDER — LACTATED RINGERS IV SOLN
500.0000 mL | INTRAVENOUS | Status: DC | PRN
  Administered 2014-05-21 (×2): 500 mL via INTRAVENOUS

## 2014-05-21 MED ORDER — FENTANYL 2.5 MCG/ML BUPIVACAINE 1/10 % EPIDURAL INFUSION (WH - ANES)
INTRAMUSCULAR | Status: DC | PRN
  Administered 2014-05-21: 14 mL/h via EPIDURAL

## 2014-05-21 MED ORDER — DIPHENHYDRAMINE HCL 50 MG/ML IJ SOLN
12.5000 mg | INTRAMUSCULAR | Status: DC | PRN
  Administered 2014-05-21: 12.5 mg via INTRAVENOUS
  Filled 2014-05-21: qty 1

## 2014-05-21 MED ORDER — CITRIC ACID-SODIUM CITRATE 334-500 MG/5ML PO SOLN: 30.0000 mL | ORAL | Status: DC | PRN

## 2014-05-21 MED ORDER — FLEET ENEMA 7-19 GM/118ML RE ENEM: 1.0000 | ENEMA | RECTAL | Status: DC | PRN

## 2014-05-21 MED ORDER — NICOTINE 21 MG/24HR TD PT24
21.0000 mg | MEDICATED_PATCH | Freq: Every day | TRANSDERMAL | Status: DC
  Administered 2014-05-21 – 2014-05-23 (×2): 21 mg via TRANSDERMAL
  Filled 2014-05-21 (×4): qty 1

## 2014-05-21 MED ORDER — PHENYLEPHRINE 40 MCG/ML (10ML) SYRINGE FOR IV PUSH (FOR BLOOD PRESSURE SUPPORT)
80.0000 ug | PREFILLED_SYRINGE | INTRAVENOUS | Status: DC | PRN
  Filled 2014-05-21: qty 2

## 2014-05-21 MED ORDER — OXYTOCIN BOLUS FROM INFUSION: 500.0000 mL | INTRAVENOUS | Status: DC

## 2014-05-21 MED ORDER — LIDOCAINE HCL (PF) 1 % IJ SOLN
INTRAMUSCULAR | Status: DC | PRN
  Administered 2014-05-21 (×2): 4 mL

## 2014-05-21 MED ORDER — FENTANYL CITRATE 0.05 MG/ML IJ SOLN: 100.0000 ug | INTRAMUSCULAR | Status: DC | PRN

## 2014-05-21 MED ORDER — OXYTOCIN 40 UNITS IN LACTATED RINGERS INFUSION - SIMPLE MED
1.0000 m[IU]/min | INTRAVENOUS | Status: DC
  Administered 2014-05-21: 2 m[IU]/min via INTRAVENOUS

## 2014-05-21 NOTE — MAU Note (Signed)
Per RGagnon, RN charge, pt to go to room 165.

## 2014-05-21 NOTE — Progress Notes (Signed)
Patient ID: Ashley Levy, female   DOB: March 04, 1985, 29 y.o.   MRN: [id number] Ashley HarmsCasey P Levy is a 29 y.o. G1P0 at 1653w3d admitted for active labor  Subjective: Comfortable w/ epidural, requesting nicotine patch  Objective: BP 117/43 mmHg  Pulse 85  Temp(Src) 98.6 F (37 C) (Oral)  Resp 20  Ht 5\' 2"  (1.575 m)  Wt 86.183 kg (190 lb)  BMI 34.74 kg/m2  SpO2 100%  LMP 07/29/2013    FHT:  FHR: 150 bpm, variability: moderate,  accelerations:  Present,  decelerations:  Absent UC:   regular, every 2-3 minutes  SVE:   Dilation: 5 Effacement (%): 90 Station: -2 Exam by:: k fields, rn  Labs: Lab Results  Component Value Date   WBC 17.6* 05/21/2014   HGB 12.3 05/21/2014   HCT 35.6* 05/21/2014   MCV 90.4 05/21/2014   PLT 172 05/21/2014    Assessment / Plan: Spontaneous labor, progressing normally  Labor: Progressing normally Fetal Wellbeing:  Category I Pain Control:  Epidural Pre-eclampsia: n/a I/D:  n/a Anticipated MOD:  NSVD  Marge DuncansBooker, Aidon Klemens Randall CNM, WHNP-BC 05/21/2014, 4:16 PM

## 2014-05-21 NOTE — MAU Note (Signed)
Pt started having contractions around 0300 this morning, they have progressively gotten worse.  Denies LOF/VB.

## 2014-05-21 NOTE — H&P (Signed)
Ashley Levy is a 29 y.o. G1P0 female at [redacted]w[redacted]d by 9wk u/s, presenting w/ uc's since 0300, worsening.   Reports active fetal movement, contractions: regular, vaginal bleeding: none, membranes: intact. Initiated prenatal care at [facility] at 11.5 wks.   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  Past Medical History: Past Medical History  Diagnosis Date  . Polycystic ovarian syndrome   . Encounter for Nexplanon removal 08/02/2013  . Contraceptive management 08/02/2013  . HSV-2 infection     Past Surgical History: Past Surgical History  Procedure Laterality Date  . No past surgeries      Obstetrical History: OB History    Gravida Para Term Preterm AB TAB SAB Ectopic Multiple Living   1               Social History: History   Social History  . Marital Status: Single    Spouse Name: N/A  . Number of Children: N/A  . Years of Education: N/A   Social History Main Topics  . Smoking status: Current Every Day Smoker -- 0.50 packs/day for 2 years    Types: Cigarettes  . Smokeless tobacco: Never Used  . Alcohol Use: No  . Drug Use: No  . Sexual Activity: Yes    Birth Control/ Protection: None   Other Topics Concern  . None   Social History Narrative    Family History: Family History  Problem Relation Age of Onset  . Asthma Mother   . Cancer Paternal Grandfather   . Psoriasis Maternal Grandmother   . Asthma Maternal Grandfather     Allergies: No Known Allergies  Prescriptions prior to admission  Medication Sig Dispense Refill Last Dose  . acetaminophen (TYLENOL) 325 MG tablet Take 650 mg by mouth every 6 (six) hours as needed.   05/20/2014 at Unknown time  . acyclovir (ZOVIRAX) 400 MG tablet Take 1 tablet (400 mg total) by mouth 3 (three) times daily. 90 tablet 3 05/20/2014 at Unknown time  . cyclobenzaprine (FLEXERIL) 10 MG tablet Take 1 tablet (10 mg total) by mouth every 8 (eight) hours as needed for muscle spasms.  15 tablet 0 05/20/2014 at Unknown time  . diphenhydrAMINE (BENADRYL) 25 MG tablet Take 50 mg by mouth every 6 (six) hours as needed for itching.   05/20/2014 at Unknown time  . flintstones complete (FLINTSTONES) 60 MG chewable tablet Chew 1 tablet by mouth daily.   05/20/2014 at Unknown time  . pantoprazole (PROTONIX) 20 MG tablet Take 1 tablet (20 mg total) by mouth daily. 30 tablet 1 05/20/2014 at Unknown time  . predniSONE (DELTASONE) 20 MG tablet Take 2 tablets (40 mg total) by mouth daily with breakfast. X 10 days 20 tablet 0 05/20/2014 at Unknown time  . terconazole (TERAZOL 7) 0.4 % vaginal cream Place 1 applicator vaginally at bedtime. (Patient not taking: Reported on 05/17/2014) 45 g 0 Not Taking     Review of Systems  Pertinent pos/neg as indicated in HPI    Blood pressure 128/83, pulse 92, temperature 97.2 F (36.2 C), temperature source Oral, resp. rate 18, last menstrual period 07/29/2013. General appearance: alert, cooperative and mild distress Lungs: clear to auscultation bilaterally Heart: regular rate and rhythm Abdomen: gravid, soft, non-tender Extremities: trace edema DTR's 2+  Fetal monitoring: FHR: 150 bpm, variability: moderate,  Accelerations: Present,  decelerations:  Absent Uterine activity: 2-4  Dilation: 4 Effacement (%): 90 Station: -2 Exam by:: Exie Parody, RN w/ BBOW Presentation:  cephalic   Prenatal labs: ABO, Rh: A/POS/-- (09/21 1640) Antibody: NEG (01/11 1101) Rubella:  Immune RPR: NON REAC (01/11 1101)  HBsAg: NEGATIVE (09/21 1640)  HIV: NONREACTIVE (01/11 1101)  GBS: Negative (03/15 0000)   2 hr GTT: 75/152/117 normal Genetic screening:  neg Anatomy US: normal female  No results found for this or any previous visit (from the past 24 hour(s)).   Assessment:  6254w3d SIUP  G1P0  Early labor  PUPPs  Cat 1 FHR  GBS Negative (03/15 0000)  Plan:  Admit to BS  IV pain meds/epidural prn active labor  Expectant management  Anticipate  NSVB   Plans to breastfeed  Contraception: undecided  Circumcision: n/a  Marge DuncansBooker, Pegah Segel Randall CNM, WHNP-BC 05/21/2014, 12:28 PM

## 2014-05-21 NOTE — Anesthesia Procedure Notes (Signed)
Epidural Patient location during procedure: OB Start time: 05/21/2014 1:53 PM  Staffing Anesthesiologist: Mal AmabileFOSTER, Kolbi Tofte Performed by: anesthesiologist   Preanesthetic Checklist Completed: patient identified, site marked, surgical consent, pre-op evaluation, timeout performed, IV checked, risks and benefits discussed and monitors and equipment checked  Epidural Patient position: sitting Prep: site prepped and draped and DuraPrep Patient monitoring: continuous pulse ox and blood pressure Approach: midline Location: L3-L4 Injection technique: LOR air  Needle:  Needle type: Tuohy  Needle gauge: 17 G Needle length: 9 cm and 9 Needle insertion depth: 4 cm Catheter type: closed end flexible Catheter size: 19 Gauge Catheter at skin depth: 9 cm Test dose: negative and Other  Assessment Events: blood not aspirated, injection not painful, no injection resistance, negative IV test and no paresthesia  Additional Notes Patient identified. Risks and benefits discussed including failed block, incomplete  Pain control, post dural puncture headache, nerve damage, paralysis, blood pressure Changes, nausea, vomiting, reactions to medications-both toxic and allergic and post Partum back pain. All questions were answered. Patient expressed understanding and wished to proceed. Sterile technique was used throughout procedure. Epidural site was Dressed with sterile barrier dressing. No paresthesias, signs of intravascular injection Or signs of intrathecal spread were encountered.  Patient was more comfortable after the epidural was dosed. Please see RN's note for documentation of vital signs and FHR which are stable.

## 2014-05-21 NOTE — Anesthesia Preprocedure Evaluation (Signed)
Anesthesia Evaluation  Patient identified by MRN, date of birth, ID band Patient awake    Reviewed: Allergy & Precautions, Patient's Chart, lab work & pertinent test results  Airway Mallampati: II  TM Distance: >3 FB Neck ROM: Full    Dental no notable dental hx. (+) Teeth Intact   Pulmonary Current Smoker,  breath sounds clear to auscultation  Pulmonary exam normal       Cardiovascular negative cardio ROS  Rhythm:Regular Rate:Normal     Neuro/Psych negative neurological ROS  negative psych ROS   GI/Hepatic negative GI ROS, Neg liver ROS,   Endo/Other  negative endocrine ROS  Renal/GU negative Renal ROS  negative genitourinary   Musculoskeletal negative musculoskeletal ROS (+)   Abdominal (+) + obese,   Peds  Hematology negative hematology ROS (+)   Anesthesia Other Findings   Reproductive/Obstetrics (+) Pregnancy                             Anesthesia Physical Anesthesia Plan  ASA: II  Anesthesia Plan: Epidural   Post-op Pain Management:    Induction:   Airway Management Planned: Natural Airway  Additional Equipment:   Intra-op Plan:   Post-operative Plan:   Informed Consent: I have reviewed the patients History and Physical, chart, labs and discussed the procedure including the risks, benefits and alternatives for the proposed anesthesia with the patient or authorized representative who has indicated his/her understanding and acceptance.     Plan Discussed with: Anesthesiologist  Anesthesia Plan Comments:         Anesthesia Quick Evaluation

## 2014-05-21 NOTE — Progress Notes (Signed)
Patient ID: Ashley Levy, female   DOB: 07-20-1985, 29 y.o.   MRN: [id number] Ashley Levy is a 29 y.o. G1P0 at 4264w3d admitted for active labor  Subjective: Comfortable w/ epidurals  Objective: BP 120/60 mmHg  Pulse 88  Temp(Src) 98.3 F (36.8 C) (Oral)  Resp 20  Ht 5\' 2"  (1.575 m)  Wt 86.183 kg (190 lb)  BMI 34.74 kg/m2  SpO2 100%  LMP 07/29/2013    FHT:  FHR: 165 bpm, variability: moderate,  accelerations:  Present,  decelerations:  Present lates UC:   regular, every 2-4 minutes  SVE:   Dilation: 6 Effacement (%): 90 Station: -2 Exam by:: Kobe Ofallon CNM BBOW Wants to wait for AROM until FOB returns +scalp stim  Pitocin @ 2 mu/min  Labs: Lab Results  Component Value Date   WBC 17.6* 05/21/2014   HGB 12.3 05/21/2014   HCT 35.6* 05/21/2014   MCV 90.4 05/21/2014   PLT 172 05/21/2014    Assessment / Plan: augmentation of labor, pitocin at 2 will turn off for now d/t lates  Labor: Progressing normally Fetal Wellbeing:  Category II Pain Control:  Epidural Pre-eclampsia: n/a I/D:  n/a Anticipated MOD:  NSVD  Marge DuncansBooker, Jannis Atkins Randall CNM, WHNP-BC 05/21/2014, 7:43 PM

## 2014-05-22 ENCOUNTER — Encounter (HOSPITAL_COMMUNITY): Payer: Self-pay | Admitting: *Deleted

## 2014-05-22 DIAGNOSIS — O411230 Chorioamnionitis, third trimester, not applicable or unspecified: Secondary | ICD-10-CM

## 2014-05-22 DIAGNOSIS — O99334 Smoking (tobacco) complicating childbirth: Secondary | ICD-10-CM

## 2014-05-22 DIAGNOSIS — O9832 Other infections with a predominantly sexual mode of transmission complicating childbirth: Secondary | ICD-10-CM

## 2014-05-22 LAB — COMPREHENSIVE METABOLIC PANEL
ALK PHOS: 200 U/L — AB (ref 39–117)
ALT: 12 U/L (ref 0–35)
ANION GAP: 10 (ref 5–15)
AST: 22 U/L (ref 0–37)
Albumin: 2.3 g/dL — ABNORMAL LOW (ref 3.5–5.2)
BUN: 10 mg/dL (ref 6–23)
CO2: 20 mmol/L (ref 19–32)
CREATININE: 0.86 mg/dL (ref 0.50–1.10)
Calcium: 7.8 mg/dL — ABNORMAL LOW (ref 8.4–10.5)
Chloride: 105 mmol/L (ref 96–112)
Glucose, Bld: 105 mg/dL — ABNORMAL HIGH (ref 70–99)
Potassium: 3.4 mmol/L — ABNORMAL LOW (ref 3.5–5.1)
SODIUM: 135 mmol/L (ref 135–145)
Total Bilirubin: 0.6 mg/dL (ref 0.3–1.2)
Total Protein: 5.4 g/dL — ABNORMAL LOW (ref 6.0–8.3)

## 2014-05-22 LAB — CBC
HCT: 31.9 % — ABNORMAL LOW (ref 36.0–46.0)
Hemoglobin: 10.8 g/dL — ABNORMAL LOW (ref 12.0–15.0)
MCH: 30.7 pg (ref 26.0–34.0)
MCHC: 33.9 g/dL (ref 30.0–36.0)
MCV: 90.6 fL (ref 78.0–100.0)
Platelets: 133 10*3/uL — ABNORMAL LOW (ref 150–400)
RBC: 3.52 MIL/uL — ABNORMAL LOW (ref 3.87–5.11)
RDW: 13.9 % (ref 11.5–15.5)
WBC: 22.3 10*3/uL — AB (ref 4.0–10.5)

## 2014-05-22 LAB — PROTEIN / CREATININE RATIO, URINE
Creatinine, Urine: 147 mg/dL
Protein Creatinine Ratio: 0.67 — ABNORMAL HIGH (ref 0.00–0.15)
Total Protein, Urine: 99 mg/dL

## 2014-05-22 MED ORDER — ACETAMINOPHEN 325 MG PO TABS: 325.0000 mg | ORAL_TABLET | Freq: Once | ORAL | Status: AC

## 2014-05-22 MED ORDER — DEXTROSE 5 % IV SOLN
160.0000 mg | Freq: Three times a day (TID) | INTRAVENOUS | Status: DC
  Administered 2014-05-22: 160 mg via INTRAVENOUS
  Filled 2014-05-22 (×2): qty 4

## 2014-05-22 MED ORDER — SODIUM CHLORIDE 0.9 % IV SOLN
2.0000 g | Freq: Four times a day (QID) | INTRAVENOUS | Status: DC
  Administered 2014-05-22: 2 g via INTRAVENOUS
  Filled 2014-05-22 (×3): qty 2000

## 2014-05-22 MED ORDER — OXYCODONE-ACETAMINOPHEN 5-325 MG PO TABS
1.0000 | ORAL_TABLET | ORAL | Status: DC | PRN
Start: 2014-05-22 — End: 2014-05-23
  Administered 2014-05-22 – 2014-05-23 (×4): 1 via ORAL
  Filled 2014-05-22 (×5): qty 1

## 2014-05-22 MED ORDER — LANOLIN HYDROUS EX OINT: TOPICAL_OINTMENT | CUTANEOUS | Status: DC | PRN

## 2014-05-22 MED ORDER — ZOLPIDEM TARTRATE 5 MG PO TABS: 5.0000 mg | ORAL_TABLET | Freq: Every evening | ORAL | Status: DC | PRN

## 2014-05-22 MED ORDER — ONDANSETRON HCL 4 MG PO TABS: 4.0000 mg | ORAL_TABLET | ORAL | Status: DC | PRN

## 2014-05-22 MED ORDER — OXYCODONE-ACETAMINOPHEN 5-325 MG PO TABS: 2.0000 | ORAL_TABLET | ORAL | Status: DC | PRN

## 2014-05-22 MED ORDER — ONDANSETRON HCL 4 MG/2ML IJ SOLN: 4.0000 mg | INTRAMUSCULAR | Status: DC | PRN

## 2014-05-22 MED ORDER — SENNOSIDES-DOCUSATE SODIUM 8.6-50 MG PO TABS
2.0000 | ORAL_TABLET | ORAL | Status: DC
  Administered 2014-05-23: 2 via ORAL
  Filled 2014-05-22: qty 2

## 2014-05-22 MED ORDER — SODIUM CHLORIDE 0.9 % IJ SOLN: 3.0000 mL | Freq: Two times a day (BID) | INTRAMUSCULAR | Status: DC

## 2014-05-22 MED ORDER — DIPHENHYDRAMINE HCL 25 MG PO CAPS
25.0000 mg | ORAL_CAPSULE | Freq: Four times a day (QID) | ORAL | Status: DC | PRN
  Administered 2014-05-23: 25 mg via ORAL
  Filled 2014-05-22: qty 1

## 2014-05-22 MED ORDER — BENZOCAINE-MENTHOL 20-0.5 % EX AERO
1.0000 "application " | INHALATION_SPRAY | CUTANEOUS | Status: DC | PRN
  Administered 2014-05-22: 1 via TOPICAL
  Filled 2014-05-22: qty 56

## 2014-05-22 MED ORDER — DIBUCAINE 1 % RE OINT: 1.0000 "application " | TOPICAL_OINTMENT | RECTAL | Status: DC | PRN

## 2014-05-22 MED ORDER — OXYTOCIN 40 UNITS IN LACTATED RINGERS INFUSION - SIMPLE MED: 62.5000 mL/h | INTRAVENOUS | Status: DC | PRN

## 2014-05-22 MED ORDER — ACETAMINOPHEN-CODEINE #3 300-30 MG PO TABS
1.0000 | ORAL_TABLET | ORAL | Status: DC | PRN
  Administered 2014-05-22: 2 via ORAL
  Filled 2014-05-22 (×2): qty 2

## 2014-05-22 MED ORDER — WITCH HAZEL-GLYCERIN EX PADS: 1.0000 "application " | MEDICATED_PAD | CUTANEOUS | Status: DC | PRN

## 2014-05-22 MED ORDER — IBUPROFEN 600 MG PO TABS
600.0000 mg | ORAL_TABLET | Freq: Four times a day (QID) | ORAL | Status: DC
  Administered 2014-05-22 – 2014-05-23 (×5): 600 mg via ORAL
  Filled 2014-05-22 (×6): qty 1

## 2014-05-22 MED ORDER — TETANUS-DIPHTH-ACELL PERTUSSIS 5-2.5-18.5 LF-MCG/0.5 IM SUSP
0.5000 mL | Freq: Once | INTRAMUSCULAR | Status: AC
  Administered 2014-05-23: 0.5 mL via INTRAMUSCULAR

## 2014-05-22 MED ORDER — SIMETHICONE 80 MG PO CHEW: 80.0000 mg | CHEWABLE_TABLET | ORAL | Status: DC | PRN

## 2014-05-22 MED ORDER — ACETAMINOPHEN 325 MG PO TABS: 650.0000 mg | ORAL_TABLET | ORAL | Status: DC | PRN

## 2014-05-22 MED ORDER — PRENATAL MULTIVITAMIN CH
1.0000 | ORAL_TABLET | Freq: Every day | ORAL | Status: DC
  Filled 2014-05-22: qty 1

## 2014-05-22 MED ORDER — SODIUM CHLORIDE 0.9 % IV SOLN: 250.0000 mL | INTRAVENOUS | Status: DC | PRN

## 2014-05-22 MED ORDER — COMPLETENATE 29-1 MG PO CHEW
1.0000 | CHEWABLE_TABLET | Freq: Every day | ORAL | Status: DC
  Administered 2014-05-23: 1 via ORAL
  Filled 2014-05-22 (×3): qty 1

## 2014-05-22 MED ORDER — SODIUM CHLORIDE 0.9 % IJ SOLN: 3.0000 mL | INTRAMUSCULAR | Status: DC | PRN

## 2014-05-22 NOTE — Progress Notes (Signed)
Ashley Levy is a 29 y.o. G1P0 at 8475w4d admitted for active labor  Subjective: Complaining of itching, otherwise doing well with epidural in place  Objective: BP 123/72 mmHg  Pulse 98  Temp(Src) 101.2 F (38.4 C) (Axillary)  Resp 18  Ht 5\' 2"  (1.575 m)  Wt 86.183 kg (190 lb)  BMI 34.74 kg/m2  SpO2 100%  LMP 07/29/2013      FHT:  FHR: 160 bpm, variability: minimal ,  accelerations:  Present,  decelerations:  Present shallow lates and earlies UC:   irregular, every 2-5 minutes SVE:  8/90/-2   Labs: Lab Results  Component Value Date   WBC 17.6* 05/21/2014   HGB 12.3 05/21/2014   HCT 35.6* 05/21/2014   MCV 90.4 05/21/2014   PLT 172 05/21/2014    Assessment / Plan: Slow progress and fetal intolerance of pitocin. Contracting well without augmentation at this point.   Labor: Progressing normally. AROM now with light meconium. Preeclampsia:  n/a Fetal Wellbeing:  Category II Pain Control:  Epidural I/D:  n/a Anticipated MOD:  NSVD  Ashley Levy, Ashley Levy 05/22/2014, 2:11 AM

## 2014-05-22 NOTE — Anesthesia Postprocedure Evaluation (Signed)
  Anesthesia Post-op Note  Patient: Ashley Levy  Procedure(s) Performed: * No procedures listed *  Patient Location: Mother/Baby  Anesthesia Type:Epidural  Level of Consciousness: awake  Airway and Oxygen Therapy: Patient Spontanous Breathing  Post-op Pain: mild  Post-op Assessment: Patient's Cardiovascular Status Stable and Respiratory Function Stable  Post-op Vital Signs: stable  Last Vitals:  Filed Vitals:   05/22/14 1332  BP: 124/56  Pulse: 86  Temp: 36.6 C  Resp: 18    Complications: No apparent anesthesia complications

## 2014-05-22 NOTE — Progress Notes (Signed)
Ashley Levy is a 29 y.o. G1P0 at 9981w4d admitted for active labor  Subjective: Still very itchy and feeling some of her contractions but tolerable  Objective: BP 123/72 mmHg  Pulse 98  Temp(Src) 101.2 F (38.4 C) (Axillary)  Resp 18  Ht 5\' 2"  (1.575 m)  Wt 86.183 kg (190 lb)  BMI 34.74 kg/m2  SpO2 100%  LMP 07/29/2013      FHT:  FHR: 155 bpm, variability: moderate,  accelerations:  Present,  decelerations:  Present rare shallow lates UC:   irregular, every 2-4 minutes SVE:  9/100/0  Labs: Lab Results  Component Value Date   WBC 17.6* 05/21/2014   HGB 12.3 05/21/2014   HCT 35.6* 05/21/2014   MCV 90.4 05/21/2014   PLT 172 05/21/2014    Assessment / Plan: Spontaneous labor, progressing normally.  Labor: Progressing normally Preeclampsia:  n/a Fetal Wellbeing:  Category I Pain Control:  Epidural I/D:  n/a Anticipated MOD:  NSVD  Beverely Lowdamo, [name] 05/22/2014, 2:15 AM

## 2014-05-22 NOTE — Progress Notes (Signed)
Ashley Levy is a 29 y.o. G1P0 at 159w4d admitted for active labor  Subjective: Complete since 2am. Pushed from 3-5 and now resting.  Objective: BP 147/81 mmHg  Pulse 112  Temp(Src) 98.1 F (36.7 C) (Oral)  Resp 20  Ht 5\' 2"  (1.575 m)  Wt 86.183 kg (190 lb)  BMI 34.74 kg/m2  SpO2 100%  LMP 07/29/2013   Total I/O In: -  Out: 1000 [Urine:1000]  FHT:  FHR: 140 bpm, variability: moderate,  accelerations:  Present,  decelerations:  Absent UC:   irregular, every 2-4 minutes SVE:   Dilation: 10 Effacement (%): 100 Station: +2 Exam by:: Honeycutt, RN  Labs: Lab Results  Component Value Date   WBC 17.6* 05/21/2014   HGB 12.3 05/21/2014   HCT 35.6* 05/21/2014   MCV 90.4 05/21/2014   PLT 172 05/21/2014    Assessment / Plan: Slow progress with ineffective pushing and disfunctional contraction patern  Labor: Resting. Will restart pit now and pushing at 7am Preeclampsia:  n/a Fetal Wellbeing:  Category I Pain Control:  Epidural I/D:  n/a Anticipated MOD:  NSVD  Ashley Levy, Ashley Levy 05/22/2014, 6:43 AM

## 2014-05-22 NOTE — Progress Notes (Signed)
Pitocin increased per Marlynn Perking Lawson CNM .

## 2014-05-22 NOTE — Lactation Note (Signed)
This note was copied from the chart of Ashley Ledell PeoplesCasey Anspach. Lactation Consultation Note  Patient Name: Ashley Levy EAVWU'JToday's Date: 05/22/2014 Reason for consult: Initial assessment of this mom and baby at 11 hours pp. This is mom's first baby and baby has had trouble latching due to either sleepy or fussy behavior.  RN, Ashley Levy informed LC that she attempted to assist with latch but when baby would not latch, mom able to hand express and spoon feed at this feeding.  LC encouraged frequent STS, cue feeding and hand expression to provide ebm as needed prior to or instead of latching while mom and baby are learning.  Family is visiting but mom will call for breastfeeding help as needed this evening.Mom encouraged to feed baby 8-12 times/24 hours and with feeding cues. LC encouraged review of Baby and Me pp 9, 14 and 20-25 for STS and BF information. LC provided [employer] Resource brochure and reviewed [facility] services and list of community and web site resources.      Maternal Data Formula Feeding for Exclusion: Yes Reason for exclusion: Mother's choice to formula and breast feed on admission Has patient been taught Hand Expression?: Yes (RN, Ashley SonCarrie demonstrated and assisted at 1730 feeding) Does the patient have breastfeeding experience prior to this delivery?: No  Feeding Feeding Type: Breast Milk  LATCH Score/Interventions Latch: Too sleepy or reluctant, no latch achieved, no sucking elicited. Intervention(s): Skin to skin Intervention(s): Breast massage;Adjust position  Audible Swallowing: A few with stimulation Intervention(s): Hand expression;Alternate breast massage;Skin to skin  Type of Nipple: Everted at rest and after stimulation  Comfort (Breast/Nipple): Soft / non-tender     Hold (Positioning): Full assist, staff holds infant at breast Intervention(s):  (EBM spoon fed to infant)  LATCH Score: 5 (recent feeding assessment, per RN, Ashley Levy; earlier feeding assessment was =7)  Lactation  Tools Discussed/Used   STS, cue feeding, hand expression  Consult Status Consult Status: Follow-up Date: 05/23/14 Follow-up type: In-patient    Warrick ParisianBryant, Jacklyne Baik [facility] 05/22/2014, 8:16 PM

## 2014-05-22 NOTE — Progress Notes (Signed)
ANTIBIOTIC CONSULT NOTE - INITIAL  Pharmacy Consult for Gentamicin Indication: Chorioamnionitis  No Known Allergies  Patient Measurements: Height: 5\' 2"  (157.5 cm) Weight: 190 lb (86.183 kg) IBW/kg (Calculated) : 50.1 Adjusted Body Weight: 61 Kg  Vital Signs: Temp: 101.2 F (38.4 C) (04/10 0148) Temp Source: Axillary (04/10 0148) BP: 123/72 mmHg (04/10 0130) Pulse Rate: 98 (04/10 0130) Intake/Output from previous day:   Intake/Output from this shift:    Labs:  Recent Labs  05/21/14 1225  WBC 17.6*  HGB 12.3  PLT 172   CrCl cannot be calculated (Patient has no serum creatinine result on file.). No results for input(s): VANCOTROUGH, VANCOPEAK, VANCORANDOM, GENTTROUGH, GENTPEAK, GENTRANDOM, TOBRATROUGH, TOBRAPEAK, TOBRARND, AMIKACINPEAK, AMIKACINTROU, AMIKACIN in the last 72 hours.   Microbiology: Recent Results (from the past 720 hour(s))  OB RESULT CONSOLE Group B Strep     Status: None   Collection Time: 04/26/14 12:00 AM  Result Value Ref Range Status   GBS Negative  Final  GC/Chlamydia Probe Amp     Status: None   Collection Time: 04/26/14 10:59 AM  Result Value Ref Range Status   Chlamydia trachomatis, NAA Negative Negative Final   Neisseria gonorrhoeae by PCR Negative Negative Final   PLEASE NOTE: Comment  Final    Comment: Acceptable specimens for this test are female urethral swab, endocervical swab and liquid based pap specimens, vaginal swabs in APTIMA transports and first void urine. See online Directory of Services for test number for rectal and pharyngeal specimens.   Culture, beta strep (group b only)     Status: None   Collection Time: 04/26/14  1:46 PM  Result Value Ref Range Status   Strep Gp B Culture Negative Negative Final    Comment: Penicillin G, ampicillin, or cefazolin are indicated for intrapartum prophylaxis of perinatal group B Strep (GBS) colonization. Reflex susceptibility testing should be performed prior to use of clindamycin only  on GBS isolates from penicillin-allergic women who are considered a high risk for anaphylaxis. Treatment with vancomycin without additional testing is warranted if resistance to clindamycin is noted. (CDC Guidelines, MMWR, Nov. 19, 2010)     Medical History: Past Medical History  Diagnosis Date  . Polycystic ovarian syndrome   . Encounter for Nexplanon removal 08/02/2013  . Contraceptive management 08/02/2013  . HSV-2 infection     Medications:  Ampicillin 2 Gm IV every 6 hours  Assessment: 29 yo G1P0 at 2916w4d admitted for active labor; now with increased and presumed chorioamnionitis.  Goal of Therapy: \ Gentamicin peaks 6-8 mcg/ml; troughs <1 mcg/ml  Plan:  Gentamicin 160 mg IV every 8 hours Monitor serum creatinine per protocol Serum gentamicin levels as indicated  Arelia SneddonMason, Maydell Knoebel Anne 05/22/2014,2:14 AM

## 2014-05-22 NOTE — Progress Notes (Signed)
Patient ID: Ashley Levy, female   DOB: 06/16/1985, 29 y.o.   MRN: [id number] Ashley HarmsCasey P Levy is a 29 y.o. G1P0 at [redacted]w[redacted]d admitted for active labor  Subjective: Comfortable, no complaints, no urge to push/pressure  Objective: BP 123/72 mmHg  Pulse 98  Temp(Src) 101.2 F (38.4 C) (Axillary)  Resp 18  Ht 5\' 2"  (1.575 m)  Wt 86.183 kg (190 lb)  BMI 34.74 kg/m2  SpO2 100%  LMP 07/29/2013    FHT:  FHR: 155 bpm, variability: mod,  accelerations:  Present,  decelerations:  Absent UC:   1-5  SVE:   Dilation: 10 Effacement (%): 100 Station: +1 Exam by:: Tylin Stradley CNM  +scalp stim pt feels hot w/ exam   Labs: Lab Results  Component Value Date   WBC 17.6* 05/21/2014   HGB 12.3 05/21/2014   HCT 35.6* 05/21/2014   MCV 90.4 05/21/2014   PLT 172 05/21/2014    Assessment / Plan: Spontaneous labor, progressing normally, complete/+1 but w/o urge to push/pressure. Now w/ 101.2 axillary, received 650mg  apap few hours ago- will give another 325mg  po now and start amp&gent per protocol for chorioamnionitis. Pt wants to try to get some rest before pushing- to let rn know when she begins to feel pressure/urge to push, otherwise begin pushing in 2hrs  Labor: Progressing normally Fetal Wellbeing:  Category I Pain Control:  Epidural Pre-eclampsia: n/a I/D:  Amp & gent for chorio Anticipated MOD:  NSVD  Marge DuncansBooker, Limuel Nieblas Randall CNM, WHNP-BC 05/22/2014, 2:04 AM

## 2014-05-23 MED ORDER — IBUPROFEN 600 MG PO TABS: 600.0000 mg | ORAL_TABLET | Freq: Four times a day (QID) | ORAL | Status: DC

## 2014-05-23 MED ORDER — OXYCODONE-ACETAMINOPHEN 5-325 MG PO TABS: 1.0000 | ORAL_TABLET | ORAL | Status: DC | PRN

## 2014-05-23 NOTE — Progress Notes (Signed)
Ur chart review completed.  

## 2014-05-23 NOTE — Discharge Summary (Signed)
Obstetric Discharge Summary Reason for Admission: induction of labor Prenatal Procedures: ultrasound Intrapartum Procedures: vacuum Postpartum Procedures: none Complications-Operative and Postpartum: none HEMOGLOBIN  Date Value Ref Range Status  05/22/2014 10.8* 12.0 - 15.0 g/dL Final   HCT  Date Value Ref Range Status  05/22/2014 31.9* 36.0 - 46.0 % Final    Physical Exam:  General: alert, cooperative, appears stated age and no distress Lochia: appropriate Uterine Fundus: firm Incision: n/a DVT Evaluation: No evidence of DVT seen on physical exam. Negative Homan's sign. No cords or calf tenderness. No significant calf/ankle edema.  Discharge Diagnoses: Term Pregnancy-delivered  Discharge Information: Date: 05/23/2014 Activity: pelvic rest Diet: routine Medications: PNV, Ibuprofen and Percocet Condition: stable and improved Instructions: refer to practice specific booklet Discharge to: home   Newborn Data: Live born female  Birth Weight: 7 lb 3 oz (3260 g) APGAR: 9, 9  Home with mother.  Jacoria Keiffer DARLENE 05/23/2014, 7:08 AM

## 2014-05-23 NOTE — Discharge Instructions (Signed)
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
## 2014-05-24 ENCOUNTER — Encounter: Payer: Medicaid Other | Admitting: Women's Health

## 2014-05-25 ENCOUNTER — Inpatient Hospital Stay (HOSPITAL_COMMUNITY): Admission: RE | Admit: 2014-05-25 | Payer: Federal, State, Local not specified - PPO | Source: Ambulatory Visit

## 2014-05-25 LAB — HIV ANTIBODY (ROUTINE TESTING W REFLEX): HIV Screen 4th Generation wRfx: NONREACTIVE

## 2014-05-25 LAB — RPR: RPR Ser Ql: NONREACTIVE

## 2014-07-05 ENCOUNTER — Ambulatory Visit: Payer: Medicaid Other | Admitting: Women's Health

## 2014-07-07 ENCOUNTER — Ambulatory Visit (INDEPENDENT_AMBULATORY_CARE_PROVIDER_SITE_OTHER): Payer: Medicaid Other | Admitting: Adult Health

## 2014-07-07 ENCOUNTER — Encounter: Payer: Self-pay | Admitting: Adult Health

## 2014-07-07 DIAGNOSIS — Z3202 Encounter for pregnancy test, result negative: Secondary | ICD-10-CM

## 2014-07-07 DIAGNOSIS — Z8659 Personal history of other mental and behavioral disorders: Secondary | ICD-10-CM

## 2014-07-07 DIAGNOSIS — O99345 Other mental disorders complicating the puerperium: Secondary | ICD-10-CM

## 2014-07-07 DIAGNOSIS — Z8759 Personal history of other complications of pregnancy, childbirth and the puerperium: Secondary | ICD-10-CM

## 2014-07-07 DIAGNOSIS — Z309 Encounter for contraceptive management, unspecified: Secondary | ICD-10-CM | POA: Insufficient documentation

## 2014-07-07 DIAGNOSIS — F53 Postpartum depression: Secondary | ICD-10-CM

## 2014-07-07 DIAGNOSIS — Z30011 Encounter for initial prescription of contraceptive pills: Secondary | ICD-10-CM

## 2014-07-07 HISTORY — DX: Postpartum depression: F53.0

## 2014-07-07 HISTORY — DX: Personal history of other complications of pregnancy, childbirth and the puerperium: Z87.59

## 2014-07-07 HISTORY — DX: Personal history of other mental and behavioral disorders: Z86.59

## 2014-07-07 LAB — POCT URINE PREGNANCY: Preg Test, Ur: NEGATIVE

## 2014-07-07 MED ORDER — ESCITALOPRAM OXALATE 10 MG PO TABS: 10.0000 mg | ORAL_TABLET | Freq: Every day | ORAL | Status: DC

## 2014-07-07 MED ORDER — NORETHIN ACE-ETH ESTRAD-FE 1-20 MG-MCG(24) PO CHEW: 1.0000 | CHEWABLE_TABLET | Freq: Every day | ORAL | Status: DC
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
## 2014-07-07 NOTE — Patient Instructions (Signed)
Start minastrin today Start lexapro today Use condoms or withdrawal  Follow up in 4 weeks Postpartum Depression and Baby Blues The postpartum period begins right after the birth of a baby. During this time, there is often a great amount of joy and excitement. It is also a time of many changes in the life of the parents. Regardless of how many times a mother gives birth, each child brings new challenges and dynamics to the family. It is not unusual to have feelings of excitement along with confusing shifts in moods, emotions, and thoughts. All mothers are at risk of developing postpartum depression or the "baby blues." These mood changes can occur right after giving birth, or they may occur many months after giving birth. The baby blues or postpartum depression can be mild or severe. Additionally, postpartum depression can go away rather quickly, or it can be a long-term condition.  CAUSES Raised hormone levels and the rapid drop in those levels are thought to be a main cause of postpartum depression and the baby blues. A number of hormones change during and after pregnancy. Estrogen and progesterone usually decrease right after the delivery of your baby. The levels of thyroid hormone and various cortisol steroids also rapidly drop. Other factors that play a role in these mood changes include major life events and genetics.  RISK FACTORS If you have any of the following risks for the baby blues or postpartum depression, know what symptoms to watch out for during the postpartum period. Risk factors that may increase the likelihood of getting the baby blues or postpartum depression include:  Having a personal or family history of depression.   Having depression while being pregnant.   Having premenstrual mood issues or mood issues related to oral contraceptives.  Having a lot of life stress.   Having marital conflict.   Lacking a social support network.   Having a baby with special needs.    Having health problems, such as diabetes.  SIGNS AND SYMPTOMS Symptoms of baby blues include:  Brief changes in mood, such as going from extreme happiness to sadness.  Decreased concentration.   Difficulty sleeping.   Crying spells, tearfulness.   Irritability.   Anxiety.  Symptoms of postpartum depression typically begin within the first month after giving birth. These symptoms include:  Difficulty sleeping or excessive sleepiness.   Marked weight loss.   Agitation.   Feelings of worthlessness.   Lack of interest in activity or food.  Postpartum psychosis is a very serious condition and can be dangerous. Fortunately, it is rare. Displaying any of the following symptoms is cause for immediate medical attention. Symptoms of postpartum psychosis include:   Hallucinations and delusions.   Bizarre or disorganized behavior.   Confusion or disorientation.  DIAGNOSIS  A diagnosis is made by an evaluation of your symptoms. There are no medical or lab tests that lead to a diagnosis, but there are various questionnaires that a health care provider may use to identify those with the baby blues, postpartum depression, or psychosis. Often, a screening tool called the [place] Postnatal Depression Scale is used to diagnose depression in the postpartum period.  TREATMENT The baby blues usually goes away on its own in 1-2 weeks. Social support is often all that is needed. You will be encouraged to get adequate sleep and rest. Occasionally, you may be given medicines to help you sleep.  Postpartum depression requires treatment because it can last several months or longer if it is not treated. Treatment  may include individual or group therapy, medicine, or both to address any social, physiological, and psychological factors that may play a role in the depression. Regular exercise, a healthy diet, rest, and social support may also be strongly recommended.  Postpartum psychosis  is more serious and needs treatment right away. Hospitalization is often needed. HOME CARE INSTRUCTIONS  Get as much rest as you can. Nap when the baby sleeps.   Exercise regularly. Some women find yoga and walking to be beneficial.   Eat a balanced and nourishing diet.   Do little things that you enjoy. Have a cup of tea, take a bubble bath, read your favorite magazine, or listen to your favorite music.  Avoid alcohol.   Ask for help with household chores, cooking, grocery shopping, or running errands as needed. Do not try to do everything.   Talk to people close to you about how you are feeling. Get support from your partner, family members, friends, or other new moms.  Try to stay positive in how you think. Think about the things you are grateful for.   Do not spend a lot of time alone.   Only take over-the-counter or prescription medicine as directed by your health care provider.  Keep all your postpartum appointments.   Let your health care provider know if you have any concerns.  SEEK MEDICAL CARE IF: You are having a reaction to or problems with your medicine. SEEK IMMEDIATE MEDICAL CARE IF:  You have suicidal feelings.   You think you may harm the baby or someone else. MAKE SURE YOU:  Understand these instructions.  Will watch your condition.  Will get help right away if you are not doing well or get worse. Document Released: 11/02/2003 Document Revised: 02/02/2013 Document Reviewed: 11/09/2012 [facility] Patient Information 2015 [place], [place]. This information is not intended to replace advice given to you by your health care provider. Make sure you discuss any questions you have with your health care provider.

## 2014-08-04 ENCOUNTER — Ambulatory Visit: Payer: Medicaid Other | Admitting: Adult Health

## 2014-08-10 ENCOUNTER — Ambulatory Visit: Payer: Medicaid Other | Admitting: Adult Health

## 2015-02-08 ENCOUNTER — Emergency Department (HOSPITAL_COMMUNITY)
Admission: EM | Admit: 2015-02-08 | Discharge: 2015-02-09 | Disposition: A | Discharge status: Home / Self Care | Payer: Self-pay | Attending: Emergency Medicine | Admitting: Emergency Medicine

## 2015-02-08 ENCOUNTER — Emergency Department (HOSPITAL_COMMUNITY): Payer: Self-pay

## 2015-02-08 ENCOUNTER — Encounter (HOSPITAL_COMMUNITY): Payer: Self-pay | Admitting: Emergency Medicine

## 2015-02-08 DIAGNOSIS — B9689 Other specified bacterial agents as the cause of diseases classified elsewhere: Secondary | ICD-10-CM | POA: Insufficient documentation

## 2015-02-08 DIAGNOSIS — N390 Urinary tract infection, site not specified: Secondary | ICD-10-CM | POA: Insufficient documentation

## 2015-02-08 DIAGNOSIS — N760 Acute vaginitis: Secondary | ICD-10-CM | POA: Insufficient documentation

## 2015-02-08 DIAGNOSIS — R102 Pelvic and perineal pain: Secondary | ICD-10-CM

## 2015-02-08 DIAGNOSIS — R1032 Left lower quadrant pain: Secondary | ICD-10-CM | POA: Insufficient documentation

## 2015-02-08 LAB — URINALYSIS, ROUTINE W REFLEX MICROSCOPIC
Bilirubin Urine: NEGATIVE
GLUCOSE, UA: NEGATIVE mg/dL
Ketones, ur: NEGATIVE mg/dL
Nitrite: NEGATIVE
Protein, ur: NEGATIVE mg/dL
Specific Gravity, Urine: 1.01 (ref 1.005–1.030)
pH: 6 (ref 5.0–8.0)

## 2015-02-08 LAB — BASIC METABOLIC PANEL
Anion gap: 10 (ref 5–15)
BUN: 7 mg/dL (ref 6–20)
CHLORIDE: 103 mmol/L (ref 101–111)
CO2: 28 mmol/L (ref 22–32)
CREATININE: 0.71 mg/dL (ref 0.44–1.00)
Calcium: 9.8 mg/dL (ref 8.9–10.3)
GFR calc Af Amer: >60 mL/min (ref >60)
GFR calc non Af Amer: >60 mL/min (ref >60)
Glucose, Bld: 106 mg/dL — ABNORMAL HIGH (ref 65–99)
POTASSIUM: 3.6 mmol/L (ref 3.5–5.1)
Sodium: 141 mmol/L (ref 135–145)

## 2015-02-08 LAB — PREGNANCY, URINE: Preg Test, Ur: NEGATIVE

## 2015-02-08 LAB — CBC WITH DIFFERENTIAL/PLATELET
Basophils Absolute: 0.1 10*3/uL (ref 0.0–0.1)
Basophils Relative: 1 %
EOS PCT: 4 %
Eosinophils Absolute: 0.4 10*3/uL (ref 0.0–0.7)
HCT: 44.1 % (ref 36.0–46.0)
Hemoglobin: 14.7 g/dL (ref 12.0–15.0)
LYMPHS ABS: 3.1 10*3/uL (ref 0.7–4.0)
LYMPHS PCT: 30 %
MCH: 30.6 pg (ref 26.0–34.0)
MCHC: 33.3 g/dL (ref 30.0–36.0)
MCV: 91.7 fL (ref 78.0–100.0)
MONOS PCT: 8 %
Monocytes Absolute: 0.8 10*3/uL (ref 0.1–1.0)
NEUTROS ABS: 6 10*3/uL (ref 1.7–7.7)
Neutrophils Relative %: 57 %
PLATELETS: 154 10*3/uL (ref 150–400)
RBC: 4.81 MIL/uL (ref 3.87–5.11)
RDW: 14 % (ref 11.5–15.5)
WBC: 10.3 10*3/uL (ref 4.0–10.5)

## 2015-02-08 LAB — WET PREP, GENITAL
Sperm: NONE SEEN
TRICH WET PREP: NONE SEEN
YEAST WET PREP: NONE SEEN

## 2015-02-08 LAB — URINE MICROSCOPIC-ADD ON

## 2015-02-08 LAB — LIPASE, BLOOD: Lipase: 19 U/L (ref 11–51)

## 2015-02-08 MED ORDER — IOHEXOL 300 MG/ML  SOLN
100.0000 mL | Freq: Once | INTRAMUSCULAR | Status: AC | PRN
  Administered 2015-02-08: 100 mL via INTRAVENOUS

## 2015-02-08 MED ORDER — SODIUM CHLORIDE 0.9 % IV SOLN
1000.0000 mL | Freq: Once | INTRAVENOUS | Status: AC
  Administered 2015-02-08: 1000 mL via INTRAVENOUS

## 2015-02-08 MED ORDER — AZITHROMYCIN 250 MG PO TABS
1000.0000 mg | ORAL_TABLET | Freq: Once | ORAL | Status: AC
  Administered 2015-02-08: 1000 mg via ORAL
  Filled 2015-02-08: qty 4

## 2015-02-08 MED ORDER — HYDROMORPHONE HCL 1 MG/ML IJ SOLN
0.5000 mg | INTRAMUSCULAR | Status: AC | PRN
  Administered 2015-02-08 (×3): 0.5 mg via INTRAVENOUS
  Filled 2015-02-08 (×3): qty 1

## 2015-02-08 MED ORDER — ONDANSETRON HCL 4 MG/2ML IJ SOLN
4.0000 mg | Freq: Once | INTRAMUSCULAR | Status: AC
  Administered 2015-02-08: 4 mg via INTRAVENOUS
  Filled 2015-02-08: qty 2

## 2015-02-08 MED ORDER — DEXTROSE 5 % IV SOLN
1.0000 g | Freq: Once | INTRAVENOUS | Status: AC
  Administered 2015-02-08: 1 g via INTRAVENOUS
  Filled 2015-02-08: qty 10

## 2015-02-08 MED ORDER — SODIUM CHLORIDE 0.9 % IV SOLN: 1000.0000 mL | INTRAVENOUS | Status: DC

## 2015-02-08 NOTE — ED Provider Notes (Signed)
  Physical Exam  BP 126/81 mmHg  Pulse 86  Temp(Src) 97.8 F (36.6 C) (Oral)  Resp 20  Ht 5\' 2"  (1.575 m)  Wt 172 lb (78.019 kg)  BMI 31.45 kg/m2  SpO2 100%  LMP 01/25/2015 (Approximate)  Physical Exam  ED Course  Procedures  MDM Pt with LLQ abd pain. Pelvic exam and CT done.  I was advised to f/u with CT scan. If CT is neg - pt to be discharged as PID.  CT scan shows large ovary on the L side. [facility]. Dr. Verdie MosherLiu made aware.  Pt wants to go via private conveyance, with her friend driving - iv removed.        Ashley KaplanAnkit Beckey Polkowski, MD 02/08/15 [phone]

## 2015-02-08 NOTE — ED Notes (Signed)
Patient given verbal instructions on [facility]  Patient and family verbalized understanding. Patient left the ER ambulatory without any acute distress.

## 2015-02-08 NOTE — ED Notes (Signed)
Pt stated that she started having some lower Lt abdo /groin pain , with pain radiating into Lt hip and through to lower back- Has had some white vag discharge - denies nausea, or problems voiding,
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
## 2015-02-08 NOTE — ED Notes (Signed)
Patient assisted to restroom, tolerated well 

## 2015-02-09 ENCOUNTER — Emergency Department (HOSPITAL_COMMUNITY): Payer: Medicaid Other

## 2015-02-09 MED ORDER — IBUPROFEN 600 MG PO TABS: 600.0000 mg | ORAL_TABLET | Freq: Four times a day (QID) | ORAL | Status: DC | PRN

## 2015-02-09 MED ORDER — CEPHALEXIN 500 MG PO CAPS: 500.0000 mg | ORAL_CAPSULE | Freq: Four times a day (QID) | ORAL | Status: DC

## 2015-02-09 MED ORDER — HYDROMORPHONE HCL 1 MG/ML IJ SOLN
0.5000 mg | Freq: Once | INTRAMUSCULAR | Status: AC
  Administered 2015-02-09: 0.5 mg via INTRAMUSCULAR
  Filled 2015-02-09: qty 1

## 2015-02-09 MED ORDER — METRONIDAZOLE 500 MG PO TABS
500.0000 mg | ORAL_TABLET | Freq: Two times a day (BID) | ORAL | Status: DC
Start: 2015-02-09 — End: 2015-05-30

## 2015-02-09 NOTE — ED Notes (Signed)
To administer pain meds upon pt return to room.

## 2015-02-09 NOTE — Discharge Instructions (Signed)
Bacterial Vaginosis °Bacterial vaginosis is a vaginal infection that occurs when the normal balance of bacteria in the vagina is disrupted. It results from an overgrowth of certain bacteria. This is the most common vaginal infection in women of childbearing age. Treatment is important to prevent complications, especially in pregnant women, as it can cause a premature delivery. °CAUSES  °Bacterial vaginosis is caused by an increase in harmful bacteria that are normally present in smaller amounts in the vagina. Several different kinds of bacteria can cause bacterial vaginosis. However, the reason that the condition develops is not fully understood. °RISK FACTORS °Certain activities or behaviors can put you at an increased risk of developing bacterial vaginosis, including: °· Having a new sex partner or multiple sex partners. °· Douching. °· Using an intrauterine device (IUD) for contraception. °Women do not get bacterial vaginosis from toilet seats, bedding, swimming pools, or contact with objects around them. °SIGNS AND SYMPTOMS  °Some women with bacterial vaginosis have no signs or symptoms. Common symptoms include: °· Grey vaginal discharge. °· A fishlike odor with discharge, especially after sexual intercourse. °· Itching or burning of the vagina and vulva. °· Burning or pain with urination. °DIAGNOSIS  °Your health care provider will take a medical history and examine the vagina for signs of bacterial vaginosis. A sample of vaginal fluid may be taken. Your health care provider will look at this sample under a microscope to check for bacteria and abnormal cells. A vaginal pH test may also be done.  °TREATMENT  °Bacterial vaginosis may be treated with antibiotic medicines. These may be given in the form of a pill or a vaginal cream. A second round of antibiotics may be prescribed if the condition comes back after treatment. Because bacterial vaginosis increases your risk for sexually transmitted diseases, getting  treated can help reduce your risk for chlamydia, gonorrhea, HIV, and herpes. °HOME CARE INSTRUCTIONS  °· Only take over-the-counter or prescription medicines as directed by your health care provider. °· If antibiotic medicine was prescribed, take it as directed. Make sure you finish it even if you start to feel better. °· Tell all sexual partners that you have a vaginal infection. They should see their health care provider and be treated if they have problems, such as a mild rash or itching. °· During treatment, it is important that you follow these instructions: °· Avoid sexual activity or use condoms correctly. °· Do not douche. °· Avoid alcohol as directed by your health care provider. °· Avoid breastfeeding as directed by your health care provider. °SEEK MEDICAL CARE IF:  °· Your symptoms are not improving after 3 days of treatment. °· You have increased discharge or pain. °· You have a fever. °MAKE SURE YOU:  °· Understand these instructions. °· Will watch your condition. °· Will get help right away if you are not doing well or get worse. °FOR MORE INFORMATION  °Centers for Disease Control and Prevention, Division of STD Prevention: www.cdc.gov/std °American Sexual Health Association (ASHA): www.ashastd.org  °  °This information is not intended to replace advice given to you by your health care provider. Make sure you discuss any questions you have with your health care provider. °  °Document Released: 01/28/2005 Document Revised: 02/18/2014 Document Reviewed: 09/09/2012 °Elsevier Interactive Patient Education ©2016 Elsevier Inc. ° °Urinary Tract Infection °Urinary tract infections (UTIs) can develop anywhere along your urinary tract. Your urinary tract is your body's drainage system for removing wastes and extra water. Your urinary tract includes two kidneys, two ureters,   a bladder, and a urethra. Your kidneys are a pair of bean-shaped organs. Each kidney is about the size of your fist. They are located below  your ribs, one on each side of your spine. °CAUSES °Infections are caused by microbes, which are microscopic organisms, including fungi, viruses, and bacteria. These organisms are so small that they can only be seen through a microscope. Bacteria are the microbes that most commonly cause UTIs. °SYMPTOMS  °Symptoms of UTIs may vary by age and gender of the patient and by the location of the infection. Symptoms in young women typically include a frequent and intense urge to urinate and a painful, burning feeling in the bladder or urethra during urination. Older women and men are more likely to be tired, shaky, and weak and have muscle aches and abdominal pain. A fever may mean the infection is in your kidneys. Other symptoms of a kidney infection include pain in your back or sides below the ribs, nausea, and vomiting. °DIAGNOSIS °To diagnose a UTI, your caregiver will ask you about your symptoms. Your caregiver will also ask you to provide a urine sample. The urine sample will be tested for bacteria and white blood cells. White blood cells are made by your body to help fight infection. °TREATMENT  °Typically, UTIs can be treated with medication. Because most UTIs are caused by a bacterial infection, they usually can be treated with the use of antibiotics. The choice of antibiotic and length of treatment depend on your symptoms and the type of bacteria causing your infection. °HOME CARE INSTRUCTIONS °· If you were prescribed antibiotics, take them exactly as your caregiver instructs you. Finish the medication even if you feel better after you have only taken some of the medication. °· Drink enough water and fluids to keep your urine clear or pale yellow. °· Avoid caffeine, tea, and carbonated beverages. They tend to irritate your bladder. °· Empty your bladder often. Avoid holding urine for long periods of time. °· Empty your bladder before and after sexual intercourse. °· After a bowel movement, women should cleanse  from front to back. Use each tissue only once. °SEEK MEDICAL CARE IF:  °· You have back pain. °· You develop a fever. °· Your symptoms do not begin to resolve within 3 days. °SEEK IMMEDIATE MEDICAL CARE IF:  °· You have severe back pain or lower abdominal pain. °· You develop chills. °· You have nausea or vomiting. °· You have continued burning or discomfort with urination. °MAKE SURE YOU:  °· Understand these instructions. °· Will watch your condition. °· Will get help right away if you are not doing well or get worse. °  °This information is not intended to replace advice given to you by your health care provider. Make sure you discuss any questions you have with your health care provider. °  °Document Released: 11/07/2004 Document Revised: 10/19/2014 Document Reviewed: 03/08/2011 °Elsevier Interactive Patient Education ©2016 Elsevier Inc. ° °

## 2015-02-10 LAB — RPR: RPR Ser Ql: NONREACTIVE

## 2015-02-10 LAB — HIV ANTIBODY (ROUTINE TESTING W REFLEX): HIV Screen 4th Generation wRfx: NONREACTIVE

## 2015-02-10 LAB — URINE CULTURE: Culture: NO GROWTH

## 2015-02-11 LAB — GC/CHLAMYDIA PROBE AMP (~~LOC~~) NOT AT ARMC
CHLAMYDIA, DNA PROBE: NEGATIVE
Neisseria Gonorrhea: NEGATIVE

## 2015-04-30 ENCOUNTER — Encounter (HOSPITAL_COMMUNITY): Payer: Self-pay | Admitting: *Deleted

## 2015-04-30 ENCOUNTER — Emergency Department (HOSPITAL_COMMUNITY): Payer: No Typology Code available for payment source

## 2015-04-30 ENCOUNTER — Emergency Department (HOSPITAL_COMMUNITY)
Admission: EM | Admit: 2015-04-30 | Discharge: 2015-04-30 | Disposition: A | Discharge status: Home / Self Care | Payer: No Typology Code available for payment source | Attending: Emergency Medicine | Admitting: Emergency Medicine

## 2015-04-30 DIAGNOSIS — Y939 Activity, unspecified: Secondary | ICD-10-CM | POA: Diagnosis not present

## 2015-04-30 DIAGNOSIS — Y999 Unspecified external cause status: Secondary | ICD-10-CM | POA: Insufficient documentation

## 2015-04-30 DIAGNOSIS — S9032XA Contusion of left foot, initial encounter: Secondary | ICD-10-CM | POA: Insufficient documentation

## 2015-04-30 DIAGNOSIS — F17210 Nicotine dependence, cigarettes, uncomplicated: Secondary | ICD-10-CM | POA: Insufficient documentation

## 2015-04-30 DIAGNOSIS — S99922A Unspecified injury of left foot, initial encounter: Secondary | ICD-10-CM | POA: Diagnosis present

## 2015-04-30 DIAGNOSIS — Y929 Unspecified place or not applicable: Secondary | ICD-10-CM | POA: Insufficient documentation

## 2015-04-30 DIAGNOSIS — T148XXA Other injury of unspecified body region, initial encounter: Secondary | ICD-10-CM

## 2015-04-30 MED ORDER — IBUPROFEN 600 MG PO TABS: 600.0000 mg | ORAL_TABLET | Freq: Four times a day (QID) | ORAL | Status: DC | PRN

## 2015-04-30 MED ORDER — HYDROCODONE-ACETAMINOPHEN 5-325 MG PO TABS: 2.0000 | ORAL_TABLET | ORAL | Status: DC | PRN
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
## 2015-04-30 NOTE — Discharge Instructions (Signed)

## 2015-05-23 ENCOUNTER — Encounter (HOSPITAL_COMMUNITY): Payer: Self-pay | Admitting: Emergency Medicine

## 2015-05-23 ENCOUNTER — Emergency Department (HOSPITAL_COMMUNITY)
Admission: EM | Admit: 2015-05-23 | Discharge: 2015-05-23 | Disposition: A | Discharge status: Home / Self Care | Payer: Medicaid Other | Attending: Emergency Medicine | Admitting: Emergency Medicine

## 2015-05-23 ENCOUNTER — Emergency Department (HOSPITAL_COMMUNITY): Payer: Medicaid Other

## 2015-05-23 DIAGNOSIS — Y929 Unspecified place or not applicable: Secondary | ICD-10-CM | POA: Diagnosis not present

## 2015-05-23 DIAGNOSIS — S62609A Fracture of unspecified phalanx of unspecified finger, initial encounter for closed fracture: Secondary | ICD-10-CM

## 2015-05-23 DIAGNOSIS — W230XXA Caught, crushed, jammed, or pinched between moving objects, initial encounter: Secondary | ICD-10-CM | POA: Diagnosis not present

## 2015-05-23 DIAGNOSIS — Y999 Unspecified external cause status: Secondary | ICD-10-CM | POA: Diagnosis not present

## 2015-05-23 DIAGNOSIS — Y939 Activity, unspecified: Secondary | ICD-10-CM | POA: Insufficient documentation

## 2015-05-23 DIAGNOSIS — S62617A Displaced fracture of proximal phalanx of left little finger, initial encounter for closed fracture: Secondary | ICD-10-CM | POA: Insufficient documentation

## 2015-05-23 DIAGNOSIS — S6992XA Unspecified injury of left wrist, hand and finger(s), initial encounter: Secondary | ICD-10-CM | POA: Diagnosis present

## 2015-05-23 DIAGNOSIS — F17210 Nicotine dependence, cigarettes, uncomplicated: Secondary | ICD-10-CM | POA: Diagnosis not present

## 2015-05-23 MED ORDER — OXYCODONE-ACETAMINOPHEN 5-325 MG PO TABS: 1.0000 | ORAL_TABLET | Freq: Four times a day (QID) | ORAL | Status: DC | PRN

## 2015-05-23 MED ORDER — OXYCODONE-ACETAMINOPHEN 5-325 MG PO TABS
1.0000 | ORAL_TABLET | Freq: Once | ORAL | Status: AC
  Administered 2015-05-23: 1 via ORAL
  Filled 2015-05-23: qty 1

## 2015-05-23 MED ORDER — NAPROXEN 500 MG PO TABS: ORAL_TABLET | ORAL | Status: DC
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
## 2015-05-23 NOTE — ED Provider Notes (Signed)
CSN: [id number]     Arrival date & time 05/23/15  0228 History   First MD Initiated Contact with Patient 05/23/15 [phone]     Chief Complaint  Patient presents with  . Hand Pain     (Consider location/radiation/quality/duration/timing/severity/associated sxs/prior Treatment) HPI patient states about 12:30 AM she slammed the car door on her own left finger. She denies any other injury. Patient is unsure when her last tetanus was but she does not want it updated. Patient states she is left-handed.  PCP Dr Macario Carls none  Past Medical History  Diagnosis Date  . Polycystic ovarian syndrome   . Encounter for Nexplanon removal 08/02/2013  . Contraceptive management 08/02/2013  . HSV-2 infection   . Postpartum depression 07/07/2014   Past Surgical History  Procedure Laterality Date  . No past surgeries     Family History  Problem Relation Age of Onset  . Asthma Mother   . Cancer Paternal Grandfather   . Psoriasis Maternal Grandmother   . Asthma Maternal Grandfather    Social History  Substance Use Topics  . Smoking status: Current Every Day Smoker -- 1.00 packs/day for 2 years    Types: Cigarettes  . Smokeless tobacco: Never Used  . Alcohol Use: No   Stay at home mother  OB History    Gravida Para Term Preterm AB TAB SAB Ectopic Multiple Living   0 1     Review of Systems  All other systems reviewed and are negative.     Allergies  Review of patient's allergies indicates no known allergies.  Home Medications   Prior to Admission medications   Medication Sig Start Date End Date Taking? Authorizing Provider  cephALEXin (KEFLEX) 500 MG capsule Take 1 capsule (500 mg total) by mouth 4 (four) times daily. 02/09/15   Leta Baptist, MD  cyclobenzaprine (FLEXERIL) 10 MG tablet Take 1 tablet (10 mg total) by mouth every 8 (eight) hours as needed for muscle spasms. 04/26/14   Cheral Marker, CNM  escitalopram (LEXAPRO) 10 MG tablet Take 1 tablet (10 mg  total) by mouth daily. Patient not taking: Reported on 02/08/2015 07/07/14   Adline Potter, NP  HYDROcodone-acetaminophen (NORCO/VICODIN) 5-325 MG tablet Take 2 tablets by mouth every 4 (four) hours as needed. 04/30/15   Elson Areas, PA-C  ibuprofen (ADVIL,MOTRIN) 600 MG tablet Take 1 tablet (600 mg total) by mouth every 6 (six) hours as needed. 04/30/15   Elson Areas, PA-C  metroNIDAZOLE (FLAGYL) 500 MG tablet Take 1 tablet (500 mg total) by mouth 2 (two) times daily. 02/09/15   Leta Baptist, MD  naproxen (NAPROSYN) 500 MG tablet Take 1 po BID with food prn pain 05/23/15   Devoria Albe, MD  Norethin Ace-Eth Estrad-FE (MINASTRIN 24 FE) 1-20 MG-MCG(24) CHEW Chew 1 tablet by mouth daily. Patient not taking: Reported on 02/08/2015 07/07/14   Adline Potter, NP  oxyCODONE-acetaminophen (PERCOCET/ROXICET) 5-325 MG tablet Take 1 tablet by mouth every 6 (six) hours as needed for moderate pain or severe pain. 05/23/15   Devoria Albe, MD   BP 131/84 mmHg  Pulse 90  Temp(Src) 98.4 F (36.9 C) (Oral)  Resp 18  Ht  (1.549 m)  Wt 168 lb (76.204 kg)  BMI 31.76 kg/m2  SpO2 98%  LMP 05/07/2015  Vital signs normal   Physical Exam  Constitutional: She is oriented to person, place, and time. She appears well-developed and well-nourished.  Non-toxic appearance. She does not appear ill. She appears distressed.  HENT:  Head: Normocephalic and atraumatic.  Right Ear: External ear normal.  Left Ear: External ear normal.  Nose: Nose normal. No mucosal edema or rhinorrhea.  Mouth/Throat: Mucous membranes are normal. No dental abscesses or uvula swelling.  Eyes: Conjunctivae and EOM are normal.  Neck: Normal range of motion and full passive range of motion without pain.  Cardiovascular: Exam reveals no gallop and no friction rub.   No murmur heard. Pulmonary/Chest: Effort normal. No respiratory distress. She has no rhonchi. She exhibits no crepitus.  Abdominal: Normal appearance. There is no  tenderness.  Musculoskeletal: She exhibits edema and tenderness.  Moves all extremities well except that she has diffuse swelling of her left little finger. She is noted to have a superficial skin tear near the DIP joint of the same finger.  Neurological: She is alert and oriented to person, place, and time. She has normal strength. No cranial nerve deficit.  Skin: Skin is warm, dry and intact. No rash noted. No erythema. No pallor.  Psychiatric: She has a normal mood and affect. Her speech is normal and behavior is normal. Her mood appears not anxious.  Nursing note and vitals reviewed.        ED Course  Procedures (including critical care time)  Medications  oxyCODONE-acetaminophen (PERCOCET/ROXICET) 5-325 MG per tablet 1 tablet (1 tablet Oral Given 05/23/15 0529)   Patient was placed in a finger splint. She was given Percocet for pain. She was advised to follow up with orthopedics for definitive care of her fracture.   Imaging Review Dg Finger Little Left  05/23/2015  CLINICAL DATA:  Slammed fifth digit in car door. EXAM: LEFT LITTLE FINGER 2+V COMPARISON:  None. FINDINGS: There is an oblique fracture at the distal aspect of the fifth proximal phalanx with mild ulnar displacement. No dislocation. No radiopaque foreign body. IMPRESSION: Fifth proximal phalangeal fracture Electronically Signed   By: Ellery Plunkaniel R Mitchell M.D.   On: 05/23/2015 03:56   I have personally reviewed and evaluated these images and lab results as part of my medical decision-making.    MDM   Final diagnoses:  Finger fracture, left, closed, initial encounter    New Prescriptions   NAPROXEN (NAPROSYN) 500 MG TABLET    Take 1 po BID with food prn pain   OXYCODONE-ACETAMINOPHEN (PERCOCET/ROXICET) 5-325 MG TABLET    Take 1 tablet by mouth every 6 (six) hours as needed for moderate pain or severe pain.    Plan discharge  Devoria AlbeIva Daymion Nazaire, MD, Concha PyoFACEP     Dina Warbington, MD 05/23/15 [phone]

## 2015-05-23 NOTE — Discharge Instructions (Signed)
Elevate your hand. Use ice packs for comfort and to keep the swelling down. Take the Percocet and the naproxen for pain. Leave the splint on until you are rechecked by the orthopedist. You can call Dr. Mort SawyersHarrison's office to see if they will treat your fracture however he may want you to see the hand specialist in [place]. You can call Dr. Janee Mornhompson to get an appointment.     Finger Fracture Fractures of fingers are breaks in the bones of the fingers. There are many types of fractures. There are different ways of treating these fractures. Your health care provider will discuss the best way to treat your fracture. CAUSES Traumatic injury is the main cause of broken fingers. These include:  Injuries while playing sports.  Workplace injuries.  Falls. RISK FACTORS Activities that can increase your risk of finger fractures include:  Sports.  Workplace activities that involve machinery.  A condition called osteoporosis, which can make your bones less dense and cause them to fracture more easily. SIGNS AND SYMPTOMS The main symptoms of a broken finger are pain and swelling within 15 minutes after the injury. Other symptoms include:  Bruising of your finger.  Stiffness of your finger.  Numbness of your finger.  Exposed bones (compound fracture) if the fracture is severe. DIAGNOSIS  The best way to diagnose a broken bone is with X-ray imaging. Additionally, your health care provider will use this X-ray image to evaluate the position of the broken finger bones.  TREATMENT  Finger fractures can be treated with:   Nonreduction--This means the bones are in place. The finger is splinted without changing the positions of the bone pieces. The splint is usually left on for about a week to 10 days. This will depend on your fracture and what your health care provider thinks.  Closed reduction--The bones are put back into position without using surgery. The finger is then splinted.  Open  reduction and internal fixation--The fracture site is opened. Then the bone pieces are fixed into place with pins or some type of hardware. This is seldom required. It depends on the severity of the fracture. HOME CARE INSTRUCTIONS   Follow your health care provider's instructions regarding activities, exercises, and physical therapy.  Only take over-the-counter or prescription medicines for pain, discomfort, or fever as directed by your health care provider. SEEK MEDICAL CARE IF: You have pain or swelling that limits the motion or use of your fingers. SEEK IMMEDIATE MEDICAL CARE IF:  Your finger becomes numb. MAKE SURE YOU:   Understand these instructions.  Will watch your condition.  Will get help right away if you are not doing well or get worse.   This information is not intended to replace advice given to you by your health care provider. Make sure you discuss any questions you have with your health care provider.   Document Released: 05/12/2000 Document Revised: 11/18/2012 Document Reviewed: 09/09/2012 Elsevier Interactive Patient Education 2016 [employer] Inc[employer]    Cast or Splint Care Casts and splints support injured limbs and keep bones from moving while they heal.  HOME CARE  Keep the cast or splint uncovered during the drying period.  A plaster cast can take 24 to 48 hours to dry.  A fiberglass cast will dry in less than 1 hour.  Do not rest the cast on anything harder than a pillow for 24 hours.  Do not put weight on your injured limb. Do not put pressure on the cast. Wait for your doctor's approval.  Keep the cast or splint dry.  Cover the cast or splint with a plastic bag during baths or wet weather.  If you have a cast over your chest and belly (trunk), take sponge baths until the cast is taken off.  If your cast gets wet, dry it with a towel or blow dryer. Use the cool setting on the blow dryer.  Keep your cast or splint clean. Wash a dirty cast with a damp  cloth.  Do not put any objects under your cast or splint.  Do not scratch the skin under the cast with an object. If itching is a problem, use a blow dryer on a cool setting over the itchy area.  Do not trim or cut your cast.  Do not take out the padding from inside your cast.  Exercise your joints near the cast as told by your doctor.  Raise (elevate) your injured limb on 1 or 2 pillows for the first 1 to 3 days. GET HELP IF:  Your cast or splint cracks.  Your cast or splint is too tight or too loose.  You itch badly under the cast.  Your cast gets wet or has a soft spot.  You have a bad smell coming from the cast.  You get an object stuck under the cast.  Your skin around the cast becomes red or sore.  You have new or more pain after the cast is put on. GET HELP RIGHT AWAY IF:  You have fluid leaking through the cast.  You cannot move your fingers or toes.  Your fingers or toes turn blue or white or are cool, painful, or puffy (swollen).  You have tingling or lose feeling (numbness) around the injured area.  You have bad pain or pressure under the cast.  You have trouble breathing or have shortness of breath.  You have chest pain.   This information is not intended to replace advice given to you by your health care provider. Make sure you discuss any questions you have with your health care provider.   Document Released: 05/30/2010 Document Revised: 09/30/2012 Document Reviewed: 08/06/2012 Elsevier Interactive Patient Education [employer]

## 2015-05-30 ENCOUNTER — Ambulatory Visit (INDEPENDENT_AMBULATORY_CARE_PROVIDER_SITE_OTHER): Payer: Medicaid Other

## 2015-05-30 ENCOUNTER — Encounter: Payer: Self-pay | Admitting: Orthopaedic Surgery

## 2015-05-30 ENCOUNTER — Ambulatory Visit (INDEPENDENT_AMBULATORY_CARE_PROVIDER_SITE_OTHER): Payer: Self-pay | Admitting: Orthopaedic Surgery

## 2015-05-30 VITALS — BP 130/86 | HR 71 | Ht 61.5 in | Wt 168.0 lb

## 2015-05-30 DIAGNOSIS — S62607A Fracture of unspecified phalanx of left little finger, initial encounter for closed fracture: Secondary | ICD-10-CM | POA: Diagnosis not present

## 2015-05-30 MED ORDER — HYDROCODONE-ACETAMINOPHEN 5-325 MG PO TABS: 1.0000 | ORAL_TABLET | ORAL | Status: DC | PRN

## 2015-05-30 NOTE — Patient Instructions (Signed)
[facility] for [facility] for finger surgery.

## 2015-05-30 NOTE — Progress Notes (Signed)
Subjective: I broke my little finger    Patient ID: Ashley Levy, female    DOB: [birth year], 30 y.o.   MRN: [id number]  Hand Injury  The incident occurred more than 1 week ago. The incident occurred at home. The injury mechanism was a direct blow. The pain is present in the left fingers. The quality of the pain is described as aching. The pain radiates to the left hand. The pain is at a severity of 3/10. The pain is mild. The pain has been fluctuating since the incident. Pertinent negatives include no chest pain, numbness or tingling. The symptoms are aggravated by movement. She has tried immobilization and NSAIDs for the symptoms. The treatment provided moderate relief.   She caught her left little finger in a car door in the early morning hours of 05-23-15.  She had pain and deformity.  She went to the ER.  X-rays showed a fracture of the proximal phalanx of the little finger oblique displaced.  I have reviewed the ER notes, the x-rays and the x-ray report.  She ws placed in a splint which she has removed.  She complains of increasing pain of the little finger.  She has a 74 month old child and that has limited her activity.  She has no other injury.   Review of Systems  HENT: Negative for congestion.   Respiratory: Negative for cough and shortness of breath.   Cardiovascular: Negative for chest pain and leg swelling.  Endocrine: Negative for cold intolerance.  Musculoskeletal: Positive for arthralgias.  Allergic/Immunologic: Negative for environmental allergies.  Neurological: Negative for tingling and numbness.  All other systems reviewed and are negative.  Past Medical History  Diagnosis Date  . Polycystic ovarian syndrome   . Encounter for Nexplanon removal 08/02/2013  . Contraceptive management 08/02/2013  . HSV-2 infection   . Postpartum depression 07/07/2014    Past Surgical History  Procedure Laterality Date  . No past surgeries      Current Outpatient Prescriptions on  File Prior to Visit  Medication Sig Dispense Refill  . naproxen (NAPROSYN) 500 MG tablet Take 1 po BID with food prn pain 30 tablet 0   No current facility-administered medications on file prior to visit.    Social History   Social History  . Marital Status: Single    Spouse Name: N/A  . Number of Children: N/A  . Years of Education: N/A   Occupational History  . Not on file.   Social History Main Topics  . Smoking status: Current Every Day Smoker -- 1.00 packs/day for 2 years    Types: Cigarettes  . Smokeless tobacco: Never Used  . Alcohol Use: No  . Drug Use: No  . Sexual Activity: Yes    Birth Control/ Protection: None   Other Topics Concern  . Not on file   Social History Narrative    BP 130/86 mmHg  Pulse 71  Ht 5' 1.5" (1.562 m)  Wt 168 lb (76.204 kg)  BMI 31.23 kg/m2  LMP 05/23/2015 (Approximate)  Breastfeeding? No    Objective:   Physical Exam  Constitutional: She is oriented to person, place, and time. She appears well-developed and well-nourished.  HENT:  Head: Normocephalic and atraumatic.  Eyes: Conjunctivae and EOM are normal. Pupils are equal, round, and reactive to light.  Neck: Normal range of motion. Neck supple.  Cardiovascular: Normal rate, regular rhythm and intact distal pulses.   Pulmonary/Chest: Effort normal.  Abdominal: Soft.  Musculoskeletal: She exhibits tenderness (  Pain of the left little finger with rotation and slight deformity near PIP joint, decreased motion, NV intact, no redness.).       Left hand: She exhibits decreased range of motion, tenderness and swelling.       Hands: Neurological: She is alert and oriented to person, place, and time. She displays normal reflexes. No cranial nerve deficit. She exhibits normal muscle tone. Coordination normal.  Skin: Skin is warm and dry.  Psychiatric: She has a normal mood and affect. Her behavior is normal. Judgment and thought content normal.   X-rays were done of the little finger  as she has been out of the splint.  Reported separately.     Assessment & Plan:   Encounter Diagnosis  Name Primary?  . Fracture of phalanx of left little finger, closed, initial encounter Yes  I feel she will need surgery on the finger.  I do not do surgery anymore.  Dr. Romeo AppleHarrison has also looked at the x-rays and would like to have the patient seen by a hand surgeon.  The family is aware and requests [facility].  An appointment will be arranged.  She has been placed in a gutter splint for the left hand.  Precautions given.  Rx for hydrocodone given.  Call if any problem.

## 2015-06-06 ENCOUNTER — Telehealth: Payer: Self-pay | Admitting: *Deleted

## 2015-06-06 ENCOUNTER — Ambulatory Visit (HOSPITAL_COMMUNITY)
Admission: RE | Admit: 2015-06-06 | Payer: No Typology Code available for payment source | Source: Ambulatory Visit | Admitting: Orthopedic Surgery

## 2015-06-06 ENCOUNTER — Encounter (HOSPITAL_COMMUNITY): Admission: RE | Payer: Self-pay | Source: Ambulatory Visit

## 2015-06-06 SURGERY — OPEN REDUCTION INTERNAL FIXATION (ORIF) DISTAL PHALANX
Anesthesia: General | Laterality: Left

## 2015-06-06 NOTE — Telephone Encounter (Signed)
Spoke with patient and she wasn't able to go for surgery 06/06/15, however,she was advised to go to ED on 06/13/15 to get prepared for surgery.

## 2015-06-06 NOTE — Telephone Encounter (Signed)
Called to check on status of referral to Dr. Amanda PeaGramig, and was advised that he advised patient go to the ED and ask for him being the patient has no insurance.His office states that this is how he sometimes handles this when the patient is self pay.

## 2015-06-07 ENCOUNTER — Ambulatory Visit: Payer: Medicaid Other | Admitting: Nurse Practitioner

## 2015-06-12 ENCOUNTER — Telehealth: Payer: Self-pay | Admitting: Family Medicine

## 2015-06-12 DIAGNOSIS — S62609S Fracture of unspecified phalanx of unspecified finger, sequela: Secondary | ICD-10-CM

## 2015-06-12 NOTE — Telephone Encounter (Signed)
Referral ordered. Patient notified.

## 2015-06-12 NOTE — Telephone Encounter (Signed)
Pt needs referral to [facility], [facility] but [facility] sent  Her to this other office. They say she needs a referral from our office due To her medicaid

## 2015-06-12 NOTE — Telephone Encounter (Signed)
So be it plz do

## 2015-06-13 ENCOUNTER — Encounter (HOSPITAL_COMMUNITY): Payer: Self-pay

## 2015-06-13 ENCOUNTER — Emergency Department (HOSPITAL_COMMUNITY)
Admission: EM | Admit: 2015-06-13 | Discharge: 2015-06-13 | Disposition: A | Discharge status: Home / Self Care | Payer: Medicaid Other | Attending: Emergency Medicine | Admitting: Emergency Medicine

## 2015-06-13 DIAGNOSIS — F17210 Nicotine dependence, cigarettes, uncomplicated: Secondary | ICD-10-CM | POA: Diagnosis not present

## 2015-06-13 DIAGNOSIS — Z8659 Personal history of other mental and behavioral disorders: Secondary | ICD-10-CM | POA: Diagnosis not present

## 2015-06-13 DIAGNOSIS — X58XXXD Exposure to other specified factors, subsequent encounter: Secondary | ICD-10-CM | POA: Diagnosis not present

## 2015-06-13 DIAGNOSIS — S62637D Displaced fracture of distal phalanx of left little finger, subsequent encounter for fracture with routine healing: Secondary | ICD-10-CM | POA: Diagnosis not present

## 2015-06-13 DIAGNOSIS — Z8639 Personal history of other endocrine, nutritional and metabolic disease: Secondary | ICD-10-CM | POA: Diagnosis not present

## 2015-06-13 DIAGNOSIS — Z8619 Personal history of other infectious and parasitic diseases: Secondary | ICD-10-CM | POA: Insufficient documentation

## 2015-06-13 DIAGNOSIS — S6992XD Unspecified injury of left wrist, hand and finger(s), subsequent encounter: Secondary | ICD-10-CM | POA: Diagnosis present

## 2015-06-13 MED ORDER — OXYCODONE-ACETAMINOPHEN 5-325 MG PO TABS
1.0000 | ORAL_TABLET | Freq: Once | ORAL | Status: AC
  Administered 2015-06-13: 1 via ORAL
  Filled 2015-06-13: qty 1

## 2015-06-13 MED ORDER — OXYCODONE-ACETAMINOPHEN 5-325 MG PO TABS: 1.0000 | ORAL_TABLET | Freq: Three times a day (TID) | ORAL | Status: DC | PRN

## 2015-06-13 NOTE — ED Provider Notes (Signed)
CSN: [id number]     Arrival date & time 06/13/15  1601 History   First MD Initiated Contact with Patient 06/13/15 1722     Chief Complaint  Patient presents with  . broken finger/Graming    (Consider location/radiation/quality/duration/timing/severity/associated sxs/prior Treatment) HPI 30 y.o. female presents to the Emergency Department today for evaluation of left little finger fracture [facility]. States that she called [facility] for a possible surgery and told her to come to the ED to see Dr. Amanda PeaGramig. Initial fracture occurred on 05-23-15. Evaluated and treated. Sent to [place]o doc in [place] who referred to [facility] for surgery.    Past Medical History  Diagnosis Date  . Polycystic ovarian syndrome   . Encounter for Nexplanon removal 08/02/2013  . Contraceptive management 08/02/2013  . HSV-2 infection   . Postpartum depression 07/07/2014   Past Surgical History  Procedure Laterality Date  . No past surgeries     Family History  Problem Relation Age of Onset  . Asthma Mother   . Cancer Paternal Grandfather   . Psoriasis Maternal Grandmother   . Asthma Maternal Grandfather    Social History  Substance Use Topics  . Smoking status: Current Every Day Smoker -- 1.00 packs/day for 2 years    Types: Cigarettes  . Smokeless tobacco: Never Used  . Alcohol Use: No   OB History    Gravida Para Term Preterm AB TAB SAB Ectopic Multiple Living   1 1 1       0 1     Review of Systems  Constitutional: Negative for fever.  Musculoskeletal: Positive for arthralgias.  Neurological: Negative for numbness.   Allergies  Review of patient's allergies indicates no known allergies.  Home Medications   Prior to Admission medications   Medication Sig Start Date End Date Taking? Authorizing Provider  HYDROcodone-acetaminophen (NORCO/VICODIN) 5-325 MG tablet Take 1 tablet by mouth every 4 (four) hours as needed for moderate pain (Must last 15 days.Do not take and  drive a car or use machinery.). 05/30/15   Darreld McleanWayne Keeling, MD  naproxen (NAPROSYN) 500 MG tablet Take 1 po BID with food prn pain Patient not taking: Reported on 06/05/2015 05/23/15   Devoria AlbeIva Knapp, MD   BP 118/85 mmHg  Pulse 76  Temp(Src) 98 F (36.7 C) (Oral)  Resp 18  Ht 5\' 2"  (1.575 m)  Wt 74.39 kg  BMI 29.99 kg/m2  SpO2 99%  LMP 05/23/2015 (Approximate)   Physical Exam  Constitutional: She is oriented to person, place, and time. She appears well-developed and well-nourished.  HENT:  Head: Normocephalic and atraumatic.  Eyes: EOM are normal.  Cardiovascular: Normal rate and regular rhythm.   Pulmonary/Chest: Effort normal.  Abdominal: Soft.  Musculoskeletal: Normal range of motion.  Pt with splint of left hand that immobilizes left little finger. Cap refill intact. Sensation intact. Splint in good condition.   Neurological: She is alert and oriented to person, place, and time.  Skin: Skin is warm and dry.  Psychiatric: She has a normal mood and affect. Her behavior is normal. Thought content normal.  Nursing note and vitals reviewed.  ED Course  Procedures (including critical care time) Labs Review Labs Reviewed - No data to display  Imaging Review No results found. I have personally reviewed and evaluated these images and lab results as part of my medical decision-making.   EKG Interpretation None      MDM   Pt is a 29yF who presents to the ED  by [facility] due to left little finger fracture of phalanx. Previously seen at [facility] with imaging done. Pt was told to come to ED here to see Dr. Amanda Pea for possible surgical evaluation tonight. [facility] paged and notified Dr. Amanda Pea of patient's visit here in ED. Pt has been NPO. On exam, pt in NAD. Nontoxic/nonseptic appearing. VSS. Afebrile. Splint noted on left hand with immobilization of left little finger. Cap refill and sensation intact. Splint in good condition. Dr. Amanda Pea came and saw and evaluated the  patient in the Emergency Department and will have pt follow up outpatient. Dr. Amanda Pea did bedside Xray and splinted himself in ED. At time of discharge, Patient is in no acute distress. Vital Signs are stable. Patient is able to ambulate. Patient able to tolerate PO.    Final diagnoses:  Closed displaced fracture of distal phalanx of left little finger, with routine healing, subsequent encounter     Audry Pili, PA-C 06/13/15 2042  Arby Barrette, MD 06/13/15 2326

## 2015-06-13 NOTE — ED Notes (Signed)
[facility] to be evaluated for left hand 5th digit fracture. To see Gramig for possible surgery

## 2015-06-13 NOTE — ED Notes (Signed)
See PA note for full assessment.  

## 2015-06-13 NOTE — ED Notes (Signed)
Spoke with Dr Amanda PeaGramig office and to page him; pt to wait to see Gramig in ED

## 2015-06-13 NOTE — ED Notes (Signed)
Secretary to page Dr. Amanda PeaGramig.

## 2015-06-13 NOTE — Discharge Instructions (Signed)
Please read and follow all provided instructions.  Your diagnoses today include:  1. Closed displaced fracture of distal phalanx of left little finger, with routine healing, subsequent encounter    Tests performed today include:  Vital signs. See below for your results today.   Medications prescribed:   Take as prescribed  You have been prescribed a narcotic medication on an "as needed" basis. Take only as prescribed. Do not drive, operate any machinery or make any important decisions while taking this medication as it is sedating. It may cause constipation take over the counter stool softeners or add fiber to your diet to treat this (Metamucil, Psyllium Fiber, Colace, Miralax) Further refills will need to be obtained from your primary care doctor and will not be prescribed through the Emergency Department. You will test positive on most drug tests while taking this medciation.   You can use Ibuprofen 400mg  combined with Tylenol 1000mg  for pain relief every 6 hours. Do not exceed 4g of Tylenol in one 24 hour period.  Use narcotics if pain uncontrolled with the aforementioned regiment.   Home care instructions:  Follow any educational materials contained in this packet.  Follow-up instructions: Please follow-up with Dr. Amanda PeaGramig (Orthopedics) for further evaluation of symptoms and treatment.  Return instructions:   Please return to the Emergency Department if you do not get better, if you get worse, or new symptoms OR  - Fever (temperature greater than 101.73F)  - Bleeding that does not stop with holding pressure to the area    -Severe pain (please note that you may be more sore the day after your accident)  - Chest Pain  - Difficulty breathing  - Severe nausea or vomiting  - Inability to tolerate food and liquids  - Passing out  - Skin becoming red around your wounds  - Change in mental status (confusion or lethargy)  - New numbness or weakness     Please return if you have any  other emergent concerns.  Additional Information:  Your vital signs today were: BP 118/85 mmHg   Pulse 76   Temp(Src) 98 F (36.7 C) (Oral)   Resp 18   Ht 5\' 2"  (1.575 m)   Wt 74.39 kg   BMI 29.99 kg/m2   SpO2 99%   LMP 05/23/2015 (Approximate) If your blood pressure (BP) was elevated above 135/85 this visit, please have this repeated by your doctor within one month. ---------------

## 2015-06-13 NOTE — Consult Note (Signed)
Reason for Consult: Fracture left small finger sustained April 11 Referring Physician: [facility] Ashley Levy is an 30 y.o. female.  HPI: 30 year old female displaced proximal phalanx fracture left small finger sustained April 11. I discussed with her her findings.  She has a 29-year-old home. She smokes pack a day.  She does not have other injury. She complains of pain she requests pain medicine. She denies neck back chest or abdominal pain.  Past Medical History  Diagnosis Date  . Polycystic ovarian syndrome   . Encounter for Nexplanon removal 08/02/2013  . Contraceptive management 08/02/2013  . HSV-2 infection   . Postpartum depression 07/07/2014    Past Surgical History  Procedure Laterality Date  . No past surgeries      Family History  Problem Relation Age of Onset  . Asthma Mother   . Cancer Paternal Grandfather   . Psoriasis Maternal Grandmother   . Asthma Maternal Grandfather     Social History:  reports that she has been smoking Cigarettes.  She has a 2 pack-year smoking history. She has never used smokeless tobacco. She reports that she does not drink alcohol or use illicit drugs.  Allergies: No Known Allergies  Medications: I have reviewed the patient's current medications.  No results found for this or any previous visit (from the past 48 hour(s)).  No results found.  Review of Systems  Constitutional: Negative.   HENT: Negative.   Eyes: Negative.   Respiratory: Negative.   Gastrointestinal: Negative.   Genitourinary: Negative.   Skin: Negative.   Neurological: Negative.   Endo/Heme/Allergies: Negative.    Blood pressure 118/85, pulse 76, temperature 98 F (36.7 C), temperature source Oral, resp. rate 18, height 5\' 2"  (1.575 m), weight 74.39 kg (164 lb), last menstrual period 05/23/2015, SpO2 99 %, not currently breastfeeding. Physical Exam Patient presents with displaced proximal phalanx fracture left small finger she was referred  by [facility]. She is originally seen through [facility] and Dr. Hilda LiasKeeling.  She notes no other findings or injury. She has a large amount of swelling pain and loss of motion. She is fluoroscopy today and the fluoroscopy exam does show fracture displacement and angulation. She unfortunately is artery gone on to stabilize the fracture and has a nascent malunion.  The patient is alert and oriented in no acute distress. The patient complains of pain in the affected upper extremity.  The patient is noted to have a normal HEENT exam. Lung fields show equal chest expansion and no shortness of breath. Abdomen exam is nontender without distention. Lower extremity examination does not show any fracture dislocation or blood clot symptoms. Pelvis is stable and the neck and back are stable and nontender.     Assessment/Plan: Displaced proximal phalanx fracture left small finger.  She was fluoroscopy today fluoroscopy exam was performed without difficulty revealing her angulation. She has about 20-25 of ulnar deviated Torrey angulation. This is nonarticular and at the metaphyseal region where it begins. This means a metaphyseal diaphyseal fracture junction. I discussed with her these issues and our concerns.  I discussed with patient specifically options of leaving it as is excepting deformity and rehabbing it now that she is 3 weeks or greater and has a nascent malunion.  The other option is surgical reconstruction with extensor tendon lysis, ORIF with takedown of the nonunion and repair reconstruction is necessary. I discussed her all issues do's and don'ts time finger is no recovery and the necessary therapy  postoperatively which she would need to just bathing.  Patient understands this and would like to proceed. Will reach outturned schedule a time for this week to perform surgical intervention she was given 40 Vicodin for pain.  We are planning surgery for your upper  extremity. The risk and benefits of surgery to include risk of bleeding, infection, anesthesia,  damage to normal structures and failure of the surgery to accomplish its intended goals of relieving symptoms and restoring function have been discussed in detail. With this in mind we plan to proceed. I have specifically discussed with the patient the pre-and postoperative regime and the dos and don'ts and risk and benefits in great detail. Risk and benefits of surgery also include risk of dystrophy(CRPS), chronic nerve pain, failure of the healing process to go onto completion and other inherent risks of surgery The relavent the pathophysiology of the disease/injury process, as well as the alternatives for treatment and postoperative course of action has been discussed in great detail with the patient who desires to proceed.  We will do everything in our power to help you (the patient) restore function to the upper extremity. It is a pleasure to see this patient today.   Karen Chafe 06/13/2015, 5:38 PM

## 2015-06-15 ENCOUNTER — Other Ambulatory Visit: Payer: Self-pay | Admitting: Orthopedic Surgery

## 2015-06-15 ENCOUNTER — Encounter (HOSPITAL_COMMUNITY): Payer: Self-pay | Admitting: *Deleted

## 2015-06-17 ENCOUNTER — Encounter (HOSPITAL_COMMUNITY): Payer: Self-pay | Admitting: *Deleted

## 2015-06-17 ENCOUNTER — Ambulatory Visit (HOSPITAL_COMMUNITY)
Admission: RE | Admit: 2015-06-17 | Discharge: 2015-06-17 | Disposition: A | Discharge status: Home / Self Care | Payer: Medicaid Other | Source: Ambulatory Visit | Attending: Orthopedic Surgery | Admitting: Orthopedic Surgery

## 2015-06-17 ENCOUNTER — Ambulatory Visit (HOSPITAL_COMMUNITY): Payer: Medicaid Other | Admitting: Anesthesiology

## 2015-06-17 ENCOUNTER — Encounter (HOSPITAL_COMMUNITY)
Admission: RE | Disposition: A | Discharge status: Home / Self Care | Payer: Self-pay | Source: Ambulatory Visit | Attending: Orthopedic Surgery

## 2015-06-17 DIAGNOSIS — X58XXXA Exposure to other specified factors, initial encounter: Secondary | ICD-10-CM | POA: Diagnosis not present

## 2015-06-17 DIAGNOSIS — S62617A Displaced fracture of proximal phalanx of left little finger, initial encounter for closed fracture: Secondary | ICD-10-CM | POA: Insufficient documentation

## 2015-06-17 DIAGNOSIS — F17210 Nicotine dependence, cigarettes, uncomplicated: Secondary | ICD-10-CM | POA: Diagnosis not present

## 2015-06-17 HISTORY — PX: OPEN REDUCTION INTERNAL FIXATION (ORIF) PROXIMAL PHALANX: SHX6235

## 2015-06-17 LAB — PREGNANCY, URINE: PREG TEST UR: NEGATIVE

## 2015-06-17 SURGERY — OPEN REDUCTION INTERNAL FIXATION (ORIF) PROXIMAL PHALANX
Anesthesia: General | Site: Finger | Laterality: Left

## 2015-06-17 MED ORDER — LIDOCAINE HCL (CARDIAC) 20 MG/ML IV SOLN
INTRAVENOUS | Status: DC | PRN
  Administered 2015-06-17: 100 mg via INTRAVENOUS

## 2015-06-17 MED ORDER — OXYCODONE HCL 5 MG/5ML PO SOLN: 5.0000 mg | Freq: Once | ORAL | Status: AC | PRN

## 2015-06-17 MED ORDER — BUPIVACAINE HCL (PF) 0.25 % IJ SOLN
INTRAMUSCULAR | Status: DC | PRN
  Administered 2015-06-17: 10 mL

## 2015-06-17 MED ORDER — METHOCARBAMOL 500 MG PO TABS: 500.0000 mg | ORAL_TABLET | Freq: Four times a day (QID) | ORAL | Status: DC

## 2015-06-17 MED ORDER — HYDROMORPHONE HCL 1 MG/ML IJ SOLN
INTRAMUSCULAR | Status: AC
  Administered 2015-06-17: 0.5 mg via INTRAVENOUS
  Filled 2015-06-17: qty 1

## 2015-06-17 MED ORDER — CEFAZOLIN SODIUM-DEXTROSE 2-4 GM/100ML-% IV SOLN
2.0000 g | INTRAVENOUS | Status: AC
  Administered 2015-06-17: 2 g via INTRAVENOUS
  Filled 2015-06-17: qty 100

## 2015-06-17 MED ORDER — MIDAZOLAM HCL 5 MG/5ML IJ SOLN
INTRAMUSCULAR | Status: DC | PRN
  Administered 2015-06-17: 2 mg via INTRAVENOUS

## 2015-06-17 MED ORDER — ONDANSETRON HCL 4 MG/2ML IJ SOLN
INTRAMUSCULAR | Status: DC | PRN
  Administered 2015-06-17: 4 mg via INTRAVENOUS

## 2015-06-17 MED ORDER — KETOROLAC TROMETHAMINE 30 MG/ML IJ SOLN
30.0000 mg | Freq: Once | INTRAMUSCULAR | Status: AC | PRN
  Administered 2015-06-17: 30 mg via INTRAVENOUS

## 2015-06-17 MED ORDER — OXYCODONE HCL 5 MG PO TABS
5.0000 mg | ORAL_TABLET | Freq: Once | ORAL | Status: AC | PRN
  Administered 2015-06-17: 5 mg via ORAL

## 2015-06-17 MED ORDER — PROPOFOL 10 MG/ML IV BOLUS
INTRAVENOUS | Status: AC
  Filled 2015-06-17: qty 20

## 2015-06-17 MED ORDER — CHLORHEXIDINE GLUCONATE 4 % EX LIQD: 60.0000 mL | Freq: Once | CUTANEOUS | Status: DC

## 2015-06-17 MED ORDER — FENTANYL CITRATE (PF) 250 MCG/5ML IJ SOLN
INTRAMUSCULAR | Status: AC
  Filled 2015-06-17: qty 5

## 2015-06-17 MED ORDER — MIDAZOLAM HCL 2 MG/2ML IJ SOLN
INTRAMUSCULAR | Status: AC
  Filled 2015-06-17: qty 2

## 2015-06-17 MED ORDER — PROPOFOL 10 MG/ML IV BOLUS
INTRAVENOUS | Status: DC | PRN
  Administered 2015-06-17: 200 mg via INTRAVENOUS

## 2015-06-17 MED ORDER — MEPERIDINE HCL 25 MG/ML IJ SOLN: 6.2500 mg | INTRAMUSCULAR | Status: DC | PRN

## 2015-06-17 MED ORDER — FENTANYL CITRATE (PF) 100 MCG/2ML IJ SOLN
INTRAMUSCULAR | Status: DC | PRN
  Administered 2015-06-17 (×3): 50 ug via INTRAVENOUS

## 2015-06-17 MED ORDER — LACTATED RINGERS IV SOLN
INTRAVENOUS | Status: DC
  Administered 2015-06-17 (×2): via INTRAVENOUS

## 2015-06-17 MED ORDER — KETOROLAC TROMETHAMINE 30 MG/ML IJ SOLN
INTRAMUSCULAR | Status: AC
  Administered 2015-06-17: 30 mg via INTRAVENOUS
  Filled 2015-06-17: qty 1

## 2015-06-17 MED ORDER — OXYCODONE HCL 5 MG PO TABS
ORAL_TABLET | ORAL | Status: DC
Start: 2015-06-17 — End: 2015-06-17
  Filled 2015-06-17: qty 1

## 2015-06-17 MED ORDER — OXYCODONE HCL 5 MG PO TABS: 5.0000 mg | ORAL_TABLET | ORAL | Status: DC | PRN

## 2015-06-17 MED ORDER — BUPIVACAINE HCL (PF) 0.25 % IJ SOLN
INTRAMUSCULAR | Status: AC
  Filled 2015-06-17: qty 30

## 2015-06-17 MED ORDER — HYDROMORPHONE HCL 1 MG/ML IJ SOLN
0.2500 mg | INTRAMUSCULAR | Status: DC | PRN
  Administered 2015-06-17 (×4): 0.5 mg via INTRAVENOUS

## 2015-06-17 SURGICAL SUPPLY — 46 items
BANDAGE ACE 3X5.8 VEL STRL LF (GAUZE/BANDAGES/DRESSINGS) ×3 IMPLANT
BIT DRILL 1.1X3.5 MINI-AO (BIT) ×3
BIT DRILL 1.1X3.5 QR MINI-AO (BIT) IMPLANT
BLADE SURG ROTATE 9660 (MISCELLANEOUS) IMPLANT
BNDG CMPR 9X4 STRL LF SNTH (GAUZE/BANDAGES/DRESSINGS)
BNDG CONFORM 2 STRL LF (GAUZE/BANDAGES/DRESSINGS) ×2 IMPLANT
BNDG ESMARK 4X9 LF (GAUZE/BANDAGES/DRESSINGS) ×1 IMPLANT
BNDG GAUZE ELAST 4 BULKY (GAUZE/BANDAGES/DRESSINGS) ×2 IMPLANT
CORDS BIPOLAR (ELECTRODE) ×3 IMPLANT
COVER SURGICAL LIGHT HANDLE (MISCELLANEOUS) ×3 IMPLANT
CUFF TOURNIQUET SINGLE 18IN (TOURNIQUET CUFF) ×3 IMPLANT
DRAIN TLS ROUND 10FR (DRAIN) IMPLANT
DRAPE OEC MINIVIEW 54X84 (DRAPES) ×2 IMPLANT
DRAPE SURG 17X23 STRL (DRAPES) ×3 IMPLANT
DRSG MEPITEL 4X7.2 (GAUZE/BANDAGES/DRESSINGS) ×2 IMPLANT
GAUZE XEROFORM 1X8 LF (GAUZE/BANDAGES/DRESSINGS) ×2 IMPLANT
GLOVE BIOGEL M 8.0 STRL (GLOVE) ×3 IMPLANT
GLOVE SS BIOGEL STRL SZ 8 (GLOVE) ×1 IMPLANT
GLOVE SUPERSENSE BIOGEL SZ 8 (GLOVE) ×2
GOWN STRL REUS W/ TWL LRG LVL3 (GOWN DISPOSABLE) ×1 IMPLANT
GOWN STRL REUS W/ TWL XL LVL3 (GOWN DISPOSABLE) ×2 IMPLANT
GOWN STRL REUS W/TWL LRG LVL3 (GOWN DISPOSABLE) ×3
GOWN STRL REUS W/TWL XL LVL3 (GOWN DISPOSABLE) ×6
KIT BASIN OR (CUSTOM PROCEDURE TRAY) ×3 IMPLANT
KIT ROOM TURNOVER OR (KITS) ×3 IMPLANT
MANIFOLD NEPTUNE II (INSTRUMENTS) ×3 IMPLANT
NEEDLE 22X1 1/2 (OR ONLY) (NEEDLE) IMPLANT
NS IRRIG 1000ML POUR BTL (IV SOLUTION) ×3 IMPLANT
PACK ORTHO EXTREMITY (CUSTOM PROCEDURE TRAY) ×3 IMPLANT
PAD ARMBOARD 7.5X6 YLW CONV (MISCELLANEOUS) ×6 IMPLANT
SCREW BONE LAG 1.5X10MM HEXA (Screw) IMPLANT
SCREW LAG HEXALOBE 1.5X10 (Screw) ×6 IMPLANT
SPLINT FIBERGLASS 3X12 (CAST SUPPLIES) ×2 IMPLANT
SPONGE GAUZE 4X4 12PLY STER LF (GAUZE/BANDAGES/DRESSINGS) ×2 IMPLANT
SPONGE LAP 4X18 X RAY DECT (DISPOSABLE) IMPLANT
SUT MNCRL AB 4-0 PS2 18 (SUTURE) ×3 IMPLANT
SUT PROLENE 3 0 PS 2 (SUTURE) IMPLANT
SUT VIC AB 3-0 FS2 27 (SUTURE) IMPLANT
SYR CONTROL 10ML LL (SYRINGE) ×2 IMPLANT
SYSTEM CHEST DRAIN TLS 7FR (DRAIN) IMPLANT
TOWEL OR 17X24 6PK STRL BLUE (TOWEL DISPOSABLE) ×3 IMPLANT
TOWEL OR 17X26 10 PK STRL BLUE (TOWEL DISPOSABLE) ×3 IMPLANT
TUBE CONNECTING 12'X1/4 (SUCTIONS) ×1
TUBE CONNECTING 12X1/4 (SUCTIONS) ×2 IMPLANT
TUBE EVACUATION TLS (MISCELLANEOUS) ×1 IMPLANT
WATER STERILE IRR 1000ML POUR (IV SOLUTION) ×3 IMPLANT

## 2015-06-17 NOTE — Discharge Instructions (Signed)
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
## 2015-06-17 NOTE — H&P (Signed)
Ashley Levy is an 30 y.o. female.   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 She has a non-/malunion left small finger proximal phalanx Patient presents for evaluation and treatment of the of their upper extremity predicament. The patient denies neck, back, chest or  abdominal pain. The patient notes that they have no lower extremity problems. The patients primary complaint is noted. We are planning surgical care pathway for the upper extremity.  Past Medical History  Diagnosis Date  . Polycystic ovarian syndrome   . Encounter for Nexplanon removal 08/02/2013  . Contraceptive management 08/02/2013  . HSV-2 infection   . Postpartum depression 07/07/2014    Past Surgical History  Procedure Laterality Date  . No past surgeries      Family History  Problem Relation Age of Onset  . Asthma Mother   . Cancer Paternal Grandfather   . Psoriasis Maternal Grandmother   . Asthma Maternal Grandfather    Social History:  reports that she has been smoking Cigarettes.  She has a 6 pack-year smoking history. She has never used smokeless tobacco. She reports that she uses illicit drugs (Marijuana). She reports that she does not drink alcohol.  Allergies: No Known Allergies  Medications Prior to Admission  Medication Sig Dispense Refill  . citalopram (CELEXA) 20 MG tablet Take 20 mg by mouth daily.    Marland Kitchen HYDROcodone-acetaminophen (NORCO/VICODIN) 5-325 MG tablet Take 1 tablet by mouth every 4 (four) hours as needed for moderate pain (Must last 15 days.Do not take and drive a car or use machinery.). 60 tablet 0  . naproxen (NAPROSYN) 500 MG tablet Take 1 po BID with food prn pain (Patient not taking: Reported on 06/05/2015) 30 tablet 0    Results for orders placed or performed during the hospital encounter of 06/17/15 (from the past 48 hour(s))  Pregnancy, urine     Status: None   Collection Time:  06/17/15  2:22 PM  Result Value Ref Range   Preg Test, Ur NEGATIVE NEGATIVE    Comment:        THE SENSITIVITY OF THIS METHODOLOGY IS >20 mIU/mL.    No results found.  Review of Systems  Respiratory: Negative.   Gastrointestinal: Negative.   Genitourinary: Negative.   Neurological: Negative.   Endo/Heme/Allergies: Negative.     Blood pressure 113/78, pulse 69, temperature 98.9 F (37.2 C), temperature source Oral, resp. rate 16, last menstrual period 06/12/2015, SpO2 99 %, not currently breastfeeding. Physical Exam deformity left small finger with pain swelling and  loss of motion Patient has intact sensation refill.  No signs of instability. In general she has a very stiff and swollen finger.  The patient is alert and oriented in no acute distress. The patient complains of pain in the affected upper extremity.  The patient is noted to have a normal HEENT exam. Lung fields show equal chest expansion and no shortness of breath. Abdomen exam is nontender without distention. Lower extremity examination does not show any fracture dislocation or blood clot symptoms. Pelvis is stable and the neck and back are stable and nontender.  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 Patient understands risk and benefits and desires to proceed.  All questions have been addressed. I discussed with the patient the necessity of therapy postoperatively and we'll try to arrange this through [facility] in [place].  All questions a been addressed.  We are  planning surgery for your upper extremity. The risk and benefits of surgery to include risk of bleeding, infection, anesthesia,  damage to normal structures and failure of the surgery to accomplish its intended goals of relieving symptoms and restoring function have been discussed in detail. With this in mind we plan to proceed. I have specifically discussed with the  patient the pre-and postoperative regime and the dos and don'ts and risk and benefits in great detail. Risk and benefits of surgery also include risk of dystrophy(CRPS), chronic nerve pain, failure of the healing process to go onto completion and other inherent risks of surgery The relavent the pathophysiology of the disease/injury process, as well as the alternatives for treatment and postoperative course of action has been discussed in great detail with the patient who desires to proceed.  We will do everything in our power to help you (the patient) restore function to the upper extremity. It is a pleasure to see this patient today.   Karen ChafeGRAMIG III,Ashley Ander M, MD 06/17/2015, 4:22 PM

## 2015-06-17 NOTE — Progress Notes (Signed)
Patient give all belongings to [place] prior to leaving.

## 2015-06-17 NOTE — Op Note (Signed)
See [id number]#[id number] Ashley PeaGramig MD

## 2015-06-17 NOTE — Anesthesia Procedure Notes (Signed)
Procedure Name: LMA Insertion Date/Time: 06/17/2015 4:50 PM Performed by: Dairl PonderJIANG, Durrell Barajas Pre-anesthesia Checklist: Patient identified, Emergency Drugs available, Suction available, Patient being monitored and Timeout performed Patient Re-evaluated:Patient Re-evaluated prior to inductionOxygen Delivery Method: Circle system utilized Preoxygenation: Pre-oxygenation with 100% oxygen Intubation Type: IV induction LMA Size: 4.0 Number of attempts: 1 Placement Confirmation: positive ETCO2 and breath sounds checked- equal and bilateral Tube secured with: Tape Dental Injury: Teeth and Oropharynx as per pre-operative assessment

## 2015-06-17 NOTE — Anesthesia Preprocedure Evaluation (Signed)
Anesthesia Evaluation  Patient identified by MRN, date of birth, ID band Patient awake    Reviewed: Allergy & Precautions, Patient's Chart, lab work & pertinent test results  Airway Mallampati: II  TM Distance: >3 FB Neck ROM: Full    Dental no notable dental hx. (+) Teeth Intact   Pulmonary Current Smoker,    Pulmonary exam normal breath sounds clear to auscultation       Cardiovascular negative cardio ROS Normal cardiovascular exam Rhythm:Regular Rate:Normal     Neuro/Psych negative neurological ROS  negative psych ROS   GI/Hepatic negative GI ROS, Neg liver ROS,   Endo/Other  negative endocrine ROS  Renal/GU negative Renal ROS     Musculoskeletal negative musculoskeletal ROS (+)   Abdominal (+) + obese,   Peds  Hematology negative hematology ROS (+)   Anesthesia Other Findings   Reproductive/Obstetrics                             Anesthesia Physical  Anesthesia Plan  ASA: I  Anesthesia Plan: General   Post-op Pain Management:    Induction: Intravenous  Airway Management Planned: LMA  Additional Equipment:   Intra-op Plan:   Post-operative Plan: Extubation in OR  Informed Consent: I have reviewed the patients History and Physical, chart, labs and discussed the procedure including the risks, benefits and alternatives for the proposed anesthesia with the patient or authorized representative who has indicated his/her understanding and acceptance.   Dental advisory given  Plan Discussed with: CRNA  Anesthesia Plan Comments:         Anesthesia Quick Evaluation

## 2015-06-17 NOTE — Anesthesia Postprocedure Evaluation (Signed)
Anesthesia Post Note  Patient: Ashley Levy  Procedure(s) Performed: Procedure(s) (LRB): OPEN REDUCTION INTERNAL FIXATION (ORIF) LEFT SMALL FINGER PROXIMAL PHALANX FRACTURE (Left)  Patient location during evaluation: PACU Anesthesia Type: General Level of consciousness: sedated and patient cooperative Pain management: pain level controlled Vital Signs Assessment: post-procedure vital signs reviewed and stable Respiratory status: spontaneous breathing Cardiovascular status: stable Anesthetic complications: no    Last Vitals:  Filed Vitals:   06/17/15 1900 06/17/15 1913  BP:  120/77  Pulse: 64 66  Temp: 36.3 C   Resp: 15 16    Last Pain:  Filed Vitals:   06/17/15 1928  PainSc: 7                  Wilena Tyndall [employer]

## 2015-06-17 NOTE — Transfer of Care (Signed)
Immediate Anesthesia Transfer of Care Note  Patient: Ashley PeoplesCasey Levy  Procedure(s) Performed: Procedure(s): OPEN REDUCTION INTERNAL FIXATION (ORIF) LEFT SMALL FINGER PROXIMAL PHALANX FRACTURE (Left)  Patient Location: PACU  Anesthesia Type:General  Level of Consciousness: awake, alert  and oriented  Airway & Oxygen Therapy: Patient Spontanous Breathing and Patient connected to nasal cannula oxygen  Post-op Assessment: Report given to RN and Post -op Vital signs reviewed and stable  Post vital signs: Reviewed and stable  Last Vitals:  Filed Vitals:   06/17/15 1439  BP: 113/78  Pulse: 69  Temp: 37.2 C  Resp: 16    Last Pain:  Filed Vitals:   06/17/15 1445  PainSc: 9       Patients Stated Pain Goal: 4 (06/17/15 1439)  Complications: No apparent anesthesia complications

## 2015-06-19 ENCOUNTER — Encounter (HOSPITAL_COMMUNITY): Payer: Self-pay | Admitting: Orthopedic Surgery

## 2015-06-19 NOTE — Op Note (Signed)
NAME:  [name], Ashley                      ACCOUNT NO.:  MEDICAL RECORD NO.:  [id number]  LOCATION:                                 FACILITY:  PHYSICIAN:  Dionne AnoWilliam M. Hedda Crumbley, M.D.DATE OF BIRTH:  1986/01/26  DATE OF PROCEDURE: DATE OF DISCHARGE:                              OPERATIVE REPORT   PREOPERATIVE DIAGNOSIS:  Nascent malunion, left small finger, proximal phalanx.  POSTOPERATIVE DIAGNOSIS:  Nascent malunion, left small finger, proximal phalanx.  PROCEDURE: 1. Open reduction and internal fixation malunion, left proximal     phalanx fracture with Acumed screws of the 1.5 mm variety. 2. AP, lateral, and oblique x-rays performed, examined, interpreted by     myself, left small finger. 3. Extensor tenolysis, tenosynovectomy, left small finger.  SURGEON:  Dionne AnoWilliam M. Amanda PeaGramig, MD  ASSISTANT:  None.  COMPLICATION:  None.  ANESTHESIA:  General.  TOURNIQUET TIME:  Less than an hour.  INDICATIONS:  A 30 year old female, who is referred from [facility]/J. Darreld McleanWayne Keeling, MD in regard to her upper extremity predicament.  She presented late and had difficulty scheduling.  She subsequently went on to malunion and desires surgical correction.  I have discussed the risks and benefits, do's and don'ts, timeframe, duration of recovery, and other issues as they are to remain to her upper extremity predicament.  OPERATIVE PROCEDURE:  The patient was seen by myself and Anesthesia, taken to operative suite, underwent smooth induction of general anesthetic, prepped with Hibiclens scrub followed by Betadine scrub and paint, followed by elevation of the arm.  Tourniquet was insufflated to 250 mmHg and following this, a curvilinear incision was made. Dissection was carried down.  Extensor apparatus was adhered to the malunited fracture.  I very carefully and cautiously performed a tenolysis tenosynovectomy and removed the adhesions.  I then made a midline split and following this,  made an incision of the periosteum off the most ulnar edge so as to have different layers for scarring purposes.  I accessed the malunion site, took this down with combination of Freer elevator, curette, and orthopedic device.  I then reduced the malunion and placed two 1.5 mm screws from the Acumed hand apparatus. The patient tolerated this well.  There were no complicating features. Once this was complete, I then irrigated copiously, performed AP, lateral, and oblique x-rays and these looked excellent.  I was pleased with the restoration of function.  I then repaired the periosteal tissue with chromic and the split in the extensor apparatus with FiberWire.  The patient tolerated this well.  There were no complicating features.  Skin was closed with Prolene.  A 10 mL Sensorcaine without epinephrine was placed in the area for postop analgesia.  I then performed an intermetacarpal block.  The patient tolerated this well.  She was dressed sterilely.  She will be discharged home, RTC in 12-14 days.  [facility] will [facility] to get her moving.  These notes have been discussed and all questions have been encouraged and answered.     Dionne AnoWilliam M. Amanda PeaGramig, M.D.     [facility]  D:  06/17/2015  T:  06/18/2015  Job:  [id number] 

## 2015-06-29 ENCOUNTER — Ambulatory Visit: Payer: Medicaid Other | Attending: Orthopedic Surgery | Admitting: *Deleted

## 2015-07-13 ENCOUNTER — Telehealth (HOSPITAL_COMMUNITY): Payer: Self-pay

## 2015-07-13 NOTE — Telephone Encounter (Signed)
07/13/15 she said that she had been using the finger and didn't think she needed to come in for the splint or therapy.  She said that she had called us but we never called back.  I told her to just call us if she felt like she needed to come in.  Paperwork is in the HOLD drawer.

## 2015-07-21 ENCOUNTER — Encounter (HOSPITAL_COMMUNITY): Payer: Self-pay | Admitting: Orthopedic Surgery

## 2015-08-23 ENCOUNTER — Ambulatory Visit: Payer: Medicaid Other

## 2015-08-28 ENCOUNTER — Encounter (HOSPITAL_COMMUNITY): Payer: Self-pay | Admitting: Emergency Medicine

## 2015-08-28 ENCOUNTER — Emergency Department (HOSPITAL_COMMUNITY): Payer: Medicaid Other

## 2015-08-28 ENCOUNTER — Emergency Department (HOSPITAL_COMMUNITY)
Admission: EM | Admit: 2015-08-28 | Discharge: 2015-08-28 | Disposition: A | Discharge status: Home / Self Care | Payer: Medicaid Other | Attending: Emergency Medicine | Admitting: Emergency Medicine

## 2015-08-28 DIAGNOSIS — M545 Low back pain: Secondary | ICD-10-CM | POA: Insufficient documentation

## 2015-08-28 DIAGNOSIS — F17210 Nicotine dependence, cigarettes, uncomplicated: Secondary | ICD-10-CM | POA: Diagnosis not present

## 2015-08-28 DIAGNOSIS — R42 Dizziness and giddiness: Secondary | ICD-10-CM | POA: Insufficient documentation

## 2015-08-28 DIAGNOSIS — R109 Unspecified abdominal pain: Secondary | ICD-10-CM | POA: Diagnosis not present

## 2015-08-28 DIAGNOSIS — J0300 Acute streptococcal tonsillitis, unspecified: Secondary | ICD-10-CM | POA: Diagnosis not present

## 2015-08-28 DIAGNOSIS — M549 Dorsalgia, unspecified: Secondary | ICD-10-CM

## 2015-08-28 DIAGNOSIS — M542 Cervicalgia: Secondary | ICD-10-CM | POA: Diagnosis not present

## 2015-08-28 DIAGNOSIS — G8929 Other chronic pain: Secondary | ICD-10-CM | POA: Insufficient documentation

## 2015-08-28 DIAGNOSIS — R5383 Other fatigue: Secondary | ICD-10-CM | POA: Diagnosis not present

## 2015-08-28 DIAGNOSIS — R0602 Shortness of breath: Secondary | ICD-10-CM | POA: Insufficient documentation

## 2015-08-28 DIAGNOSIS — R0789 Other chest pain: Secondary | ICD-10-CM | POA: Insufficient documentation

## 2015-08-28 DIAGNOSIS — R112 Nausea with vomiting, unspecified: Secondary | ICD-10-CM | POA: Insufficient documentation

## 2015-08-28 LAB — CBC
HEMATOCRIT: 43 % (ref 36.0–46.0)
Hemoglobin: 14.8 g/dL (ref 12.0–15.0)
MCH: 31.6 pg (ref 26.0–34.0)
MCHC: 34.4 g/dL (ref 30.0–36.0)
MCV: 91.9 fL (ref 78.0–100.0)
Platelets: 125 10*3/uL — ABNORMAL LOW (ref 150–400)
RBC: 4.68 MIL/uL (ref 3.87–5.11)
RDW: 13.7 % (ref 11.5–15.5)
WBC: 14 10*3/uL — ABNORMAL HIGH (ref 4.0–10.5)

## 2015-08-28 LAB — BASIC METABOLIC PANEL
ANION GAP: 7 (ref 5–15)
BUN: 12 mg/dL (ref 6–20)
CHLORIDE: 104 mmol/L (ref 101–111)
CO2: 23 mmol/L (ref 22–32)
Calcium: 8.9 mg/dL (ref 8.9–10.3)
Creatinine, Ser: 0.69 mg/dL (ref 0.44–1.00)
Glucose, Bld: 106 mg/dL — ABNORMAL HIGH (ref 65–99)
POTASSIUM: 3.2 mmol/L — AB (ref 3.5–5.1)
Sodium: 134 mmol/L — ABNORMAL LOW (ref 135–145)

## 2015-08-28 LAB — LIPASE, BLOOD: LIPASE: 17 U/L (ref 11–51)

## 2015-08-28 LAB — URINALYSIS, ROUTINE W REFLEX MICROSCOPIC
BILIRUBIN URINE: NEGATIVE
Glucose, UA: NEGATIVE mg/dL
Hgb urine dipstick: NEGATIVE
KETONES UR: NEGATIVE mg/dL
LEUKOCYTES UA: NEGATIVE
NITRITE: NEGATIVE
Protein, ur: NEGATIVE mg/dL
SPECIFIC GRAVITY, URINE: 1.02 (ref 1.005–1.030)
pH: 8 (ref 5.0–8.0)

## 2015-08-28 LAB — HEPATIC FUNCTION PANEL
ALBUMIN: 4.5 g/dL (ref 3.5–5.0)
ALT: 21 U/L (ref 14–54)
AST: 18 U/L (ref 15–41)
Alkaline Phosphatase: 68 U/L (ref 38–126)
BILIRUBIN INDIRECT: 0.3 mg/dL (ref 0.3–0.9)
Bilirubin, Direct: 0.1 mg/dL (ref 0.1–0.5)
TOTAL PROTEIN: 7.6 g/dL (ref 6.5–8.1)
Total Bilirubin: 0.4 mg/dL (ref 0.3–1.2)

## 2015-08-28 LAB — RAPID STREP SCREEN (MED CTR MEBANE ONLY): Streptococcus, Group A Screen (Direct): POSITIVE — AB

## 2015-08-28 LAB — CK: CK TOTAL: 62 U/L (ref 38–234)

## 2015-08-28 LAB — HCG, QUANTITATIVE, PREGNANCY: HCG, BETA CHAIN, QUANT, S: 1 m[IU]/mL (ref <5)

## 2015-08-28 LAB — PREGNANCY, URINE: PREG TEST UR: NEGATIVE

## 2015-08-28 LAB — LACTIC ACID, PLASMA: LACTIC ACID, VENOUS: 1.3 mmol/L (ref 0.5–1.9)

## 2015-08-28 LAB — TROPONIN I: Troponin I: <0.03 ng/mL (ref <0.03)

## 2015-08-28 MED ORDER — PENICILLIN G BENZATHINE 1200000 UNIT/2ML IM SUSP
1.2000 10*6.[IU] | Freq: Once | INTRAMUSCULAR | Status: AC
  Administered 2015-08-28: 1.2 10*6.[IU] via INTRAMUSCULAR
  Filled 2015-08-28: qty 2

## 2015-08-28 MED ORDER — SODIUM CHLORIDE 0.9 % IV BOLUS (SEPSIS)
2000.0000 mL | Freq: Once | INTRAVENOUS | Status: AC
  Administered 2015-08-28: 2000 mL via INTRAVENOUS
  Filled 2015-08-28: qty 2000

## 2015-08-28 MED ORDER — METHOCARBAMOL 500 MG PO TABS
1000.0000 mg | ORAL_TABLET | Freq: Once | ORAL | Status: AC
  Administered 2015-08-28: 1000 mg via ORAL
  Filled 2015-08-28: qty 2

## 2015-08-28 MED ORDER — ACETAMINOPHEN 325 MG PO TABS
650.0000 mg | ORAL_TABLET | ORAL | Status: DC | PRN
  Administered 2015-08-28: 650 mg via ORAL
  Filled 2015-08-28: qty 2

## 2015-08-28 MED ORDER — DEXAMETHASONE SODIUM PHOSPHATE 10 MG/ML IJ SOLN
10.0000 mg | Freq: Four times a day (QID) | INTRAMUSCULAR | Status: DC
  Administered 2015-08-28: 10 mg via INTRAVENOUS
  Filled 2015-08-28: qty 1

## 2015-08-28 MED ORDER — IBUPROFEN 600 MG PO TABS: 600.0000 mg | ORAL_TABLET | Freq: Four times a day (QID) | ORAL | Status: DC | PRN

## 2015-08-28 MED ORDER — KETOROLAC TROMETHAMINE 30 MG/ML IJ SOLN
30.0000 mg | Freq: Once | INTRAMUSCULAR | Status: AC
  Administered 2015-08-28: 30 mg via INTRAVENOUS
  Filled 2015-08-28: qty 1

## 2015-08-28 MED ORDER — SODIUM CHLORIDE 0.9 % IV BOLUS (SEPSIS)
1000.0000 mL | Freq: Once | INTRAVENOUS | Status: AC
  Administered 2015-08-28: 1000 mL via INTRAVENOUS

## 2015-08-28 MED ORDER — ONDANSETRON HCL 4 MG/2ML IJ SOLN
4.0000 mg | Freq: Once | INTRAMUSCULAR | Status: AC
  Administered 2015-08-28: 4 mg via INTRAVENOUS
  Filled 2015-08-28: qty 2

## 2015-08-28 MED ORDER — ONDANSETRON HCL 4 MG PO TABS: 4.0000 mg | ORAL_TABLET | Freq: Three times a day (TID) | ORAL | Status: DC | PRN

## 2015-08-28 MED ORDER — METHOCARBAMOL 500 MG PO TABS: 1000.0000 mg | ORAL_TABLET | Freq: Three times a day (TID) | ORAL | Status: DC | PRN

## 2015-08-28 NOTE — ED Provider Notes (Signed)
CSN: [id number]     Arrival date & time 08/28/15  1445 History   First MD Initiated Contact with Patient 08/28/15 1547     Chief Complaint  Patient presents with  . Chest Pain  . Shortness of Breath     (Consider location/radiation/quality/duration/timing/severity/associated sxs/prior Treatment) HPI Patient is a poor historian. She presents with multiple complaints. Appears that her chief complaint is thoracic back and chest pain that started after walking 2 miles earlier this morning. She complained of vomiting prior to onset of her chest pain. She does not know how many times she vomited. She denies any blood in the vomit. She also complains of shortness of breath. She has chronic low back pain which is worsened today. Eyes focal weakness or numbness. No dysuria or incontinence. Patient also does complain of abdominal pain mostly on the left. She states she believes this is her ovary. No new vaginal bleeding for discharge. When asked specifically patient also complains of sore throat. She has some anterior neck tenderness. No neck stiffness. Questionable loss of consciousness after walking. Past Medical History  Diagnosis Date  . Polycystic ovarian syndrome   . Encounter for Nexplanon removal 08/02/2013  . Contraceptive management 08/02/2013  . HSV-2 infection   . Postpartum depression 07/07/2014   Past Surgical History  Procedure Laterality Date  . No past surgeries    . Open reduction internal fixation (orif) proximal phalanx Left 06/17/2015    Procedure: OPEN REDUCTION INTERNAL FIXATION (ORIF) LEFT SMALL FINGER PROXIMAL PHALANX FRACTURE;  Surgeon: Dominica Severin, MD;  Location: [facility] OR;  Service: Orthopedics;  Laterality: Left;   Family History  Problem Relation Age of Onset  . Asthma Mother   . Cancer Paternal Grandfather   . Psoriasis Maternal Grandmother   . Asthma Maternal Grandfather    Social History  Substance Use Topics  . Smoking status: Current Every Day Smoker -- 1.00  packs/day for 6 years    Types: Cigarettes  . Smokeless tobacco: Never Used  . Alcohol Use: Yes     Comment: occasionally   OB History    Gravida Para Term Preterm AB TAB SAB Ectopic Multiple Living   1 1 1       0 1     Review of Systems  Constitutional: Positive for fever and fatigue. Negative for chills.  HENT: Positive for sore throat. Negative for congestion.   Respiratory: Positive for shortness of breath. Negative for cough.   Cardiovascular: Positive for chest pain. Negative for palpitations and leg swelling.  Gastrointestinal: Positive for nausea, vomiting and abdominal pain. Negative for diarrhea and blood in stool.  Genitourinary: Positive for flank pain. Negative for dysuria, frequency, vaginal bleeding, vaginal discharge and pelvic pain.  Musculoskeletal: Positive for myalgias, back pain and neck pain. Negative for neck stiffness.  Skin: Negative for rash and wound.  Neurological: Positive for dizziness, weakness (generalized) and light-headedness. Negative for tremors, numbness and headaches.  Psychiatric/Behavioral: The patient is nervous/anxious.   All other systems reviewed and are negative.     Allergies  Review of patient's allergies indicates no known allergies.  Home Medications   Prior to Admission medications   Medication Sig Start Date End Date Taking? Authorizing Provider  ibuprofen (ADVIL,MOTRIN) 600 MG tablet Take 1 tablet (600 mg total) by mouth every 6 (six) hours as needed. 08/28/15   Loren Racer, MD  methocarbamol (ROBAXIN) 500 MG tablet Take 2 tablets (1,000 mg total) by mouth every 8 (eight) hours as needed for muscle spasms. 08/28/15  Loren Raceravid Javyon Fontan, MD  ondansetron (ZOFRAN) 4 MG tablet Take 1 tablet (4 mg total) by mouth every 8 (eight) hours as needed for nausea or vomiting. 08/28/15   Loren Raceravid Shaft Corigliano, MD  oxyCODONE (ROXICODONE) 5 MG immediate release tablet Take 1 tablet (5 mg total) by mouth every 4 (four) hours as needed for severe  pain. Patient not taking: Reported on 08/28/2015 06/17/15   Karie ChimeraBrian Buchanan, PA-C   BP 108/62 mmHg  Pulse 89  Temp(Src) 99.1 F (37.3 C) (Oral)  Resp 22  Ht 5\' 1"  (1.549 m)  Wt 158 lb (71.668 kg)  BMI 29.87 kg/m2  SpO2 99%  LMP 08/21/2015 (LMP Unknown) Physical Exam  Constitutional: She is oriented to person, place, and time. She appears well-developed and well-nourished. She appears distressed.  Very anxious, hyperventilating.  HENT:  Head: Normocephalic and atraumatic.  Mouth/Throat: Oropharynx is clear and moist.  Oropharynx is erythematous with tonsillar hypertrophy. Uvula is midline.  Eyes: EOM are normal. Pupils are equal, round, and reactive to light.  Neck: Normal range of motion. Neck supple.  No meningismus.  Cardiovascular: Normal rate and regular rhythm.  Exam reveals no gallop and no friction rub.   No murmur heard. Pulmonary/Chest: Effort normal and breath sounds normal. No respiratory distress. She has no wheezes. She has no rales.  Abdominal: Soft. Bowel sounds are normal. There is tenderness (diffuse abdominal tenderness but appears to be most tender in the left lower quadrant.). There is guarding (voluntary guarding). There is no rebound.  Musculoskeletal: Normal range of motion. She exhibits no edema or tenderness.  No lower extremity asymmetry, swelling or tenderness. Patient has both midline thoracic and lumbar tenderness. There are no step-offs or deformity. Questionable right-sided CVA tenderness.  Neurological: She is alert and oriented to person, place, and time.  Moves all extremities without focal deficit. Sensation is fully intact.  Skin: Skin is warm and dry. No rash noted. No erythema.  Psychiatric:  Anxious, histrionic  Nursing note and vitals reviewed.   ED Course  Procedures (including critical care time) Labs Review Labs Reviewed  RAPID STREP SCREEN (NOT AT [facility]) - Abnormal; Notable for the following:    Streptococcus, Group A Screen (Direct)  POSITIVE (*)    All other components within normal limits  BASIC METABOLIC PANEL - Abnormal; Notable for the following:    Sodium 134 (*)    Potassium 3.2 (*)    Glucose, Bld 106 (*)    All other components within normal limits  CBC - Abnormal; Notable for the following:    WBC 14.0 (*)    Platelets 125 (*)    All other components within normal limits  CULTURE, BLOOD (ROUTINE X 2)  CULTURE, BLOOD (ROUTINE X 2)  TROPONIN I  CK  HEPATIC FUNCTION PANEL  LIPASE, BLOOD  URINALYSIS, ROUTINE W REFLEX MICROSCOPIC (NOT AT [facility])  HCG, QUANTITATIVE, PREGNANCY  LACTIC ACID, PLASMA  PREGNANCY, URINE    Imaging Review Dg Chest 2 View  08/28/2015  CLINICAL DATA:  Chest pain and shortness of breath EXAM: CHEST  2 VIEW COMPARISON:  Chest radiograph Jun 22, 2009 and chest CT April 21, 2011 FINDINGS: Lungs are clear. The heart size and pulmonary vascularity are normal. No adenopathy. No pneumothorax. No bone lesions. IMPRESSION: No edema or consolidation. Electronically Signed   By: Bretta BangWilliam  Woodruff III M.D.   On: 08/28/2015 15:24   Dg Lumbar Spine 2-3 Views  08/28/2015  CLINICAL DATA:  Low back pain for 2-3 months. No recent injury. Initial  encounter. EXAM: LUMBAR SPINE - 2-3 VIEW COMPARISON:  CT abdomen and pelvis 02/08/2015. FINDINGS: There is no evidence of lumbar spine fracture. Alignment is normal. Intervertebral disc spaces are maintained. IMPRESSION: Normal exam. Electronically Signed   By: Drusilla Kanner M.D.   On: 08/28/2015 18:41   I have personally reviewed and evaluated these images and lab results as part of my medical decision-making.   EKG Interpretation   Date/Time:  Monday August 28 2015 14:47:53 EDT Ventricular Rate:  103 PR Interval:  132 QRS Duration: 78 QT Interval:  310 QTC Calculation: 406 R Axis:   70 Text Interpretation:  Sinus tachycardia Cannot rule out Anterior infarct ,  age undetermined Abnormal ECG Confirmed by Jodi Mourning MD, Ivin Booty [phone]) on  08/28/2015 2:55:48  PM Also confirmed by Ranae Palms  MD, Makenzy Krist ([id number])  on  08/28/2015 3:56:13 PM      MDM   Final diagnoses:  Strep tonsillitis  Atypical chest pain  Chronic back pain   Patient is now much more calm. Abdominal exam is benign. Both tachycardia and fever have resolved. She's had no vomiting in the emergency department and has tolerated orals. Appears the cause for her fever is due to strep throat. Have given IM penicillin in the emergency department. Discharged home with anti-inflammatory and muscle relaxants. Back pain is chronic in nature. No suspicions for emergent intra-abdominal process. Understands need to follow-up with her primary physician and return precautions have been given.     Loren Racer, MD 08/28/15 [phone]

## 2015-08-28 NOTE — ED Notes (Signed)
Patient states her car broke down this morning and she had to walk approximately 2 miles home. Complaining of shortness of breath, chest pain, and syncopal episode after walking home today at approximately 1200.

## 2015-08-28 NOTE — Discharge Instructions (Signed)
Tonsillitis Tonsillitis is an infection of the throat that causes the tonsils to become red, tender, and swollen. Tonsils are collections of lymphoid tissue at the back of the throat. Each tonsil has crevices (crypts). Tonsils help fight nose and throat infections and keep infection from spreading to other parts of the body for the first 18 months of life.  CAUSES Sudden (acute) tonsillitis is usually caused by infection with streptococcal bacteria. Long-lasting (chronic) tonsillitis occurs when the crypts of the tonsils become filled with pieces of food and bacteria, which makes it easy for the tonsils to become repeatedly infected. SYMPTOMS  Symptoms of tonsillitis include:  A sore throat, with possible difficulty swallowing.  White patches on the tonsils.  Fever.  Tiredness.  New episodes of snoring during sleep, when you did not snore before.  Small, foul-smelling, yellowish-white pieces of material (tonsilloliths) that you occasionally cough up or spit out. The tonsilloliths can also cause you to have bad breath. DIAGNOSIS Tonsillitis can be diagnosed through a physical exam. Diagnosis can be confirmed with the results of lab tests, including a throat culture. TREATMENT  The goals of tonsillitis treatment include the reduction of the severity and duration of symptoms and prevention of associated conditions. Symptoms of tonsillitis can be improved with the use of steroids to reduce the swelling. Tonsillitis caused by bacteria can be treated with antibiotic medicines. Usually, treatment with antibiotic medicines is started before the cause of the tonsillitis is known. However, if it is determined that the cause is not bacterial, antibiotic medicines will not treat the tonsillitis. If attacks of tonsillitis are severe and frequent, your health care provider may recommend surgery to remove the tonsils (tonsillectomy). HOME CARE INSTRUCTIONS   Rest as much as possible and get plenty of  sleep.  Drink plenty of fluids. While the throat is very sore, eat soft foods or liquids, such as sherbet, soups, or instant breakfast drinks.  Eat frozen ice pops.  Gargle with a warm or cold liquid to help soothe the throat. Mix 1/4 teaspoon of salt and 1/4 teaspoon of baking soda in 8 oz of water. SEEK MEDICAL CARE IF:   Large, tender lumps develop in your neck.  A rash develops.  A green, yellow-brown, or bloody substance is coughed up.  You are unable to swallow liquids or food for 24 hours.  You notice that only one of the tonsils is swollen. SEEK IMMEDIATE MEDICAL CARE IF:   You develop any new symptoms such as vomiting, severe headache, stiff neck, chest pain, or trouble breathing or swallowing.  You have severe throat pain along with drooling or voice changes.  You have severe pain, unrelieved with recommended medications.  You are unable to fully open the mouth.  You develop redness, swelling, or severe pain anywhere in the neck.  You have a fever. MAKE SURE YOU:   Understand these instructions.  Will watch your condition.  Will get help right away if you are not doing well or get worse.   This information is not intended to replace advice given to you by your health care provider. Make sure you discuss any questions you have with your health care provider.   Document Released: 11/07/2004 Document Revised: 02/18/2014 Document Reviewed: 07/17/2012 Elsevier Interactive Patient Education 2016 [employer] Inc[employer]  Nonspecific Chest Pain  Chest pain can be caused by many different conditions. There is always a chance that your pain could be related to something serious, such as a heart attack or a blood clot in  your lungs. Chest pain can also be caused by conditions that are not life-threatening. If you have chest pain, it is very important to follow up with your health care provider. CAUSES  Chest pain can be caused by:  Heartburn.  Pneumonia or  bronchitis.  Anxiety or stress.  Inflammation around your heart (pericarditis) or lung (pleuritis or pleurisy).  A blood clot in your lung.  A collapsed lung (pneumothorax). It can develop suddenly on its own (spontaneous pneumothorax) or from trauma to the chest.  Shingles infection (varicella-zoster virus).  Heart attack.  Damage to the bones, muscles, and cartilage that make up your chest wall. This can include:  Bruised bones due to injury.  Strained muscles or cartilage due to frequent or repeated coughing or overwork.  Fracture to one or more ribs.  Sore cartilage due to inflammation (costochondritis). RISK FACTORS  Risk factors for chest pain may include:  Activities that increase your risk for trauma or injury to your chest.  Respiratory infections or conditions that cause frequent coughing.  Medical conditions or overeating that can cause heartburn.  Heart disease or family history of heart disease.  Conditions or health behaviors that increase your risk of developing a blood clot.  Having had chicken pox (varicella zoster). SIGNS AND SYMPTOMS Chest pain can feel like:  Burning or tingling on the surface of your chest or deep in your chest.  Crushing, pressure, aching, or squeezing pain.  Dull or sharp pain that is worse when you move, cough, or take a deep breath.  Pain that is also felt in your back, neck, shoulder, or arm, or pain that spreads to any of these areas. Your chest pain may come and go, or it may stay constant. DIAGNOSIS Lab tests or other studies may be needed to find the cause of your pain. Your health care provider may have you take a test called an ambulatory ECG (electrocardiogram). An ECG records your heartbeat patterns at the time the test is performed. You may also have other tests, such as:  Transthoracic echocardiogram (TTE). During echocardiography, sound waves are used to create a picture of all of the heart structures and to look  at how blood flows through your heart.  Transesophageal echocardiogram (TEE).This is a more advanced imaging test that obtains images from inside your body. It allows your health care provider to see your heart in finer detail.  Cardiac monitoring. This allows your health care provider to monitor your heart rate and rhythm in real time.  Holter monitor. This is a portable device that records your heartbeat and can help to diagnose abnormal heartbeats. It allows your health care provider to track your heart activity for several days, if needed.  Stress tests. These can be done through exercise or by taking medicine that makes your heart beat more quickly.  Blood tests.  Imaging tests. TREATMENT  Your treatment depends on what is causing your chest pain. Treatment may include:  Medicines. These may include:  Acid blockers for heartburn.  Anti-inflammatory medicine.  Pain medicine for inflammatory conditions.  Antibiotic medicine, if an infection is present.  Medicines to dissolve blood clots.  Medicines to treat coronary artery disease.  Supportive care for conditions that do not require medicines. This may include:  Resting.  Applying heat or cold packs to injured areas.  Limiting activities until pain decreases. HOME CARE INSTRUCTIONS  If you were prescribed an antibiotic medicine, finish it all even if you start to feel better.  Avoid any  activities that bring on chest pain.  Do not use any tobacco products, including cigarettes, chewing tobacco, or electronic cigarettes. If you need help quitting, ask your health care provider.  Do not drink alcohol.  Take medicines only as directed by your health care provider.  Keep all follow-up visits as directed by your health care provider. This is important. This includes any further testing if your chest pain does not go away.  If heartburn is the cause for your chest pain, you may be told to keep your head raised  (elevated) while sleeping. This reduces the chance that acid will go from your stomach into your esophagus.  Make lifestyle changes as directed by your health care provider. These may include:  Getting regular exercise. Ask your health care provider to suggest some activities that are safe for you.  Eating a heart-healthy diet. A registered dietitian can help you to learn healthy eating options.  Maintaining a healthy weight.  Managing diabetes, if necessary.  Reducing stress. SEEK MEDICAL CARE IF:  Your chest pain does not go away after treatment.  You have a rash with blisters on your chest.  You have a fever. SEEK IMMEDIATE MEDICAL CARE IF:   Your chest pain is worse.  You have an increasing cough, or you cough up blood.  You have severe abdominal pain.  You have severe weakness.  You faint.  You have chills.  You have sudden, unexplained chest discomfort.  You have sudden, unexplained discomfort in your arms, back, neck, or jaw.  You have shortness of breath at any time.  You suddenly start to sweat, or your skin gets clammy.  You feel nauseous or you vomit.  You suddenly feel light-headed or dizzy.  Your heart begins to beat quickly, or it feels like it is skipping beats. These symptoms may represent a serious problem that is an emergency. Do not wait to see if the symptoms will go away. Get medical help right away. Call your local emergency services (911 in the [place] Do not drive yourself to the hospital.   This information is not intended to replace advice given to you by your health care provider. Make sure you discuss any questions you have with your health care provider.   Document Released: 11/07/2004 Document Revised: 02/18/2014 Document Reviewed: 09/03/2013 Elsevier Interactive Patient Education 2016 Elsevier Inc[employer] Back Pain, Adult Back pain is very common in adults.The cause of back pain is rarely dangerous and the pain often gets better over  time.The cause of your back pain may not be known. Some common causes of back pain include:  Strain of the muscles or ligaments supporting the spine.  Wear and tear (degeneration) of the spinal disks.  Arthritis.  Direct injury to the back. For many people, back pain may return. Since back pain is rarely dangerous, most people can learn to manage this condition on their own. HOME CARE INSTRUCTIONS Watch your back pain for any changes. The following actions may help to lessen any discomfort you are feeling:  Remain active. It is stressful on your back to sit or stand in one place for long periods of time. Do not sit, drive, or stand in one place for more than 30 minutes at a time. Take short walks on even surfaces as soon as you are able.Try to increase the length of time you walk each day.  Exercise regularly as directed by your health care provider. Exercise helps your back heal faster. It also helps avoid future injury  by keeping your muscles strong and flexible.  Do not stay in bed.Resting more than 1-2 days can delay your recovery.  Pay attention to your body when you bend and lift. The most comfortable positions are those that put less stress on your recovering back. Always use proper lifting techniques, including:  Bending your knees.  Keeping the load close to your body.  Avoiding twisting.  Find a comfortable position to sleep. Use a firm mattress and lie on your side with your knees slightly bent. If you lie on your back, put a pillow under your knees.  Avoid feeling anxious or stressed.Stress increases muscle tension and can worsen back pain.It is important to recognize when you are anxious or stressed and learn ways to manage it, such as with exercise.  Take medicines only as directed by your health care provider. Over-the-counter medicines to reduce pain and inflammation are often the most helpful.Your health care provider may prescribe muscle relaxant drugs.These  medicines help dull your pain so you can more quickly return to your normal activities and healthy exercise.  Apply ice to the injured area:  Put ice in a plastic bag.  Place a towel between your skin and the bag.  Leave the ice on for 20 minutes, 2-3 times a day for the first 2-3 days. After that, ice and heat may be alternated to reduce pain and spasms.  Maintain a healthy weight. Excess weight puts extra stress on your back and makes it difficult to maintain good posture. SEEK MEDICAL CARE IF:  You have pain that is not relieved with rest or medicine.  You have increasing pain going down into the legs or buttocks.  You have pain that does not improve in one week.  You have night pain.  You lose weight.  You have a fever or chills. SEEK IMMEDIATE MEDICAL CARE IF:   You develop new bowel or bladder control problems.  You have unusual weakness or numbness in your arms or legs.  You develop nausea or vomiting.  You develop abdominal pain.  You feel faint.   This information is not intended to replace advice given to you by your health care provider. Make sure you discuss any questions you have with your health care provider.   Document Released: 01/28/2005 Document Revised: 02/18/2014 Document Reviewed: 06/01/2013 Elsevier Interactive Patient Education [employer]

## 2015-08-28 NOTE — ED Notes (Signed)
Pt reports generalized pain, began this morning with pain to her mid-upper back, progressively worse. Pt also admits to diffuse abd pain, tender to palpation and "vomiting too many times to count." Pt hyperventilating during physician assessment however easily calmed with verbal support.

## 2015-09-02 LAB — CULTURE, BLOOD (ROUTINE X 2)
CULTURE: NO GROWTH
Culture: NO GROWTH

## 2016-08-06 ENCOUNTER — Emergency Department (HOSPITAL_COMMUNITY)
Admission: EM | Admit: 2016-08-06 | Discharge: 2016-08-06 | Disposition: A | Discharge status: Home / Self Care | Payer: Self-pay | Attending: Emergency Medicine | Admitting: Emergency Medicine

## 2016-08-06 ENCOUNTER — Emergency Department (HOSPITAL_COMMUNITY): Payer: Self-pay

## 2016-08-06 ENCOUNTER — Encounter (HOSPITAL_COMMUNITY): Payer: Self-pay | Admitting: Emergency Medicine

## 2016-08-06 DIAGNOSIS — S39012A Strain of muscle, fascia and tendon of lower back, initial encounter: Secondary | ICD-10-CM | POA: Insufficient documentation

## 2016-08-06 DIAGNOSIS — F17210 Nicotine dependence, cigarettes, uncomplicated: Secondary | ICD-10-CM | POA: Insufficient documentation

## 2016-08-06 DIAGNOSIS — Y939 Activity, unspecified: Secondary | ICD-10-CM | POA: Insufficient documentation

## 2016-08-06 DIAGNOSIS — Y999 Unspecified external cause status: Secondary | ICD-10-CM | POA: Insufficient documentation

## 2016-08-06 DIAGNOSIS — Y929 Unspecified place or not applicable: Secondary | ICD-10-CM | POA: Insufficient documentation

## 2016-08-06 DIAGNOSIS — X509XXA Other and unspecified overexertion or strenuous movements or postures, initial encounter: Secondary | ICD-10-CM | POA: Insufficient documentation

## 2016-08-06 LAB — BASIC METABOLIC PANEL
Anion gap: 7 (ref 5–15)
BUN: 10 mg/dL (ref 6–20)
CALCIUM: 8.9 mg/dL (ref 8.9–10.3)
CO2: 26 mmol/L (ref 22–32)
CREATININE: 0.72 mg/dL (ref 0.44–1.00)
Chloride: 105 mmol/L (ref 101–111)
GFR calc non Af Amer: >60 mL/min (ref >60)
GLUCOSE: 86 mg/dL (ref 65–99)
Potassium: 3.4 mmol/L — ABNORMAL LOW (ref 3.5–5.1)
Sodium: 138 mmol/L (ref 135–145)

## 2016-08-06 LAB — CBC WITH DIFFERENTIAL/PLATELET
Basophils Absolute: 0 10*3/uL (ref 0.0–0.1)
Basophils Relative: 0 %
Eosinophils Absolute: 0.2 10*3/uL (ref 0.0–0.7)
Eosinophils Relative: 3 %
HEMATOCRIT: 41.5 % (ref 36.0–46.0)
Hemoglobin: 14 g/dL (ref 12.0–15.0)
Lymphocytes Relative: 26 %
Lymphs Abs: 1.9 10*3/uL (ref 0.7–4.0)
MCH: 30.8 pg (ref 26.0–34.0)
MCHC: 33.7 g/dL (ref 30.0–36.0)
MCV: 91.4 fL (ref 78.0–100.0)
MONO ABS: 0.6 10*3/uL (ref 0.1–1.0)
MONOS PCT: 8 %
NEUTROS ABS: 4.6 10*3/uL (ref 1.7–7.7)
Neutrophils Relative %: 63 %
Platelets: 110 10*3/uL — ABNORMAL LOW (ref 150–400)
RBC: 4.54 MIL/uL (ref 3.87–5.11)
RDW: 13.8 % (ref 11.5–15.5)
WBC: 7.2 10*3/uL (ref 4.0–10.5)

## 2016-08-06 LAB — PREGNANCY, URINE: Preg Test, Ur: NEGATIVE

## 2016-08-06 LAB — URINALYSIS, ROUTINE W REFLEX MICROSCOPIC
BILIRUBIN URINE: NEGATIVE
GLUCOSE, UA: NEGATIVE mg/dL
HGB URINE DIPSTICK: NEGATIVE
KETONES UR: NEGATIVE mg/dL
Leukocytes, UA: NEGATIVE
NITRITE: NEGATIVE
PH: 6.5 (ref 5.0–8.0)
Protein, ur: NEGATIVE mg/dL
Specific Gravity, Urine: 1.02 (ref 1.005–1.030)

## 2016-08-06 MED ORDER — PREDNISONE 20 MG PO TABS: ORAL_TABLET | ORAL | 0 refills | Status: DC

## 2016-08-06 MED ORDER — HYDROCODONE-ACETAMINOPHEN 5-325 MG PO TABS: 1.0000 | ORAL_TABLET | Freq: Four times a day (QID) | ORAL | 0 refills | Status: DC | PRN

## 2016-08-06 MED ORDER — CYCLOBENZAPRINE HCL 10 MG PO TABS: 10.0000 mg | ORAL_TABLET | Freq: Three times a day (TID) | ORAL | 0 refills | Status: DC | PRN

## 2016-08-06 MED ORDER — HYDROMORPHONE HCL 1 MG/ML IJ SOLN
1.0000 mg | Freq: Once | INTRAMUSCULAR | Status: AC
  Administered 2016-08-06: 1 mg via INTRAMUSCULAR
  Filled 2016-08-06: qty 1

## 2016-08-06 NOTE — ED Provider Notes (Signed)
[facility]EMER[facility]ENCY DEPT Provider Note   CSN: [id number] Arrival date & time: 08/06/16  1259     History   Chief Complaint Chief Complaint  Patient presents with  . Back Pain    HPI Ashley Levy is a 31 y.o. female.  Pt complains of back pain.  Pt also has pain down her left leg   The history is provided by the patient.  Back Pain   This is a new problem. The current episode started more than 2 days ago. The problem occurs constantly. The problem has not changed since onset.The pain is associated with no known injury. The pain is present in the lumbar spine. The quality of the pain is described as aching. The pain radiates to the left thigh. The pain is at a severity of 6/10. The pain is moderate. The symptoms are aggravated by bending. The pain is the same all the time. Pertinent negatives include no chest pain, no headaches and no abdominal pain.    Past Medical History:  Diagnosis Date  . Contraceptive management 08/02/2013  . Encounter for Nexplanon removal 08/02/2013  . HSV-2 infection   . Polycystic ovarian syndrome   . Postpartum depression 07/07/2014    Patient Active Problem List   Diagnosis Date Noted  . Contraceptive management 07/07/2014  . Postpartum depression 07/07/2014  . Active labor at term 05/21/2014  . PUPPP (pruritic urticarial papules and plaques of pregnancy) 05/17/2014  . Supervision of normal first pregnancy 11/01/2013  . Smoker 11/01/2013  . 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 NO PAST SURGERIES    . OPEN REDUCTION INTERNAL FIXATION (ORIF) PROXIMAL PHALANX Left 06/17/2015   Procedure: OPEN REDUCTION INTERNAL FIXATION (ORIF) LEFT SMALL FINGER PROXIMAL PHALANX FRACTURE;  Surgeon: Dominica Severin, MD;  Location: [facility] OR;  Service: Orthopedics;  Laterality: Left;    OB History    Gravida Para Term Preterm AB Living   1 1 1     1    SAB TAB Ectopic Multiple Live Births         0 1         Home Medications    Prior to Admission medications   Medication Sig Start Date End Date Taking? Authorizing Provider  acetaminophen (TYLENOL) 325 MG tablet Take 650 mg by mouth every 6 (six) hours as needed.   Yes [provider]  ibuprofen (ADVIL,MOTRIN) 600 MG tablet Take 1 tablet (600 mg total) by mouth every 6 (six) hours as needed. 08/28/15  Yes Loren Racer, MD  cyclobenzaprine (FLEXERIL) 10 MG tablet Take 1 tablet (10 mg total) by mouth 3 (three) times daily as needed for muscle spasms. 08/06/16   Bethann Berkshire, MD  HYDROcodone-acetaminophen (NORCO/VICODIN) 5-325 MG tablet Take 1 tablet by mouth every 6 (six) hours as needed for moderate pain. 08/06/16   Bethann Berkshire, MD  methocarbamol (ROBAXIN) 500 MG tablet Take 2 tablets (1,000 mg total) by mouth every 8 (eight) hours as needed for muscle spasms. 08/28/15   Loren Racer, MD  ondansetron (ZOFRAN) 4 MG tablet Take 1 tablet (4 mg total) by mouth every 8 (eight) hours as needed for nausea or vomiting. Patient not taking: Reported on 08/06/2016 08/28/15   Loren Racer, MD  oxyCODONE (ROXICODONE) 5 MG immediate release tablet Take 1 tablet (5 mg total) by mouth every 4 (four) hours as needed for severe pain. Patient not taking: Reported on 08/28/2015 06/17/15   Karie Chimera, PA-C  predniSONE (DELTASONE) 20 MG tablet 2 tabs po daily x 3 days 08/06/16   Bethann Berkshire, MD    Family History Family History  Problem Relation Age of Onset  . Asthma Mother   . Cancer Paternal Grandfather   . Psoriasis Maternal Grandmother   . Asthma Maternal Grandfather     Social History Social History  Substance Use Topics  . Smoking status: Current Every Day Smoker    Packs/day: 1.00    Years: 6.00    Types: Cigarettes  . Smokeless tobacco: Never Used  . Alcohol use Yes     Comment: occasionally     Allergies   Patient has no known allergies.   Review of Systems Review of Systems  Constitutional: Negative for  appetite change and fatigue.  HENT: Negative for congestion, ear discharge and sinus pressure.   Eyes: Negative for discharge.  Respiratory: Negative for cough.   Cardiovascular: Negative for chest pain.  Gastrointestinal: Negative for abdominal pain and diarrhea.  Genitourinary: Negative for frequency and hematuria.  Musculoskeletal: Positive for back pain.  Skin: Negative for rash.  Neurological: Negative for seizures and headaches.  Psychiatric/Behavioral: Negative for hallucinations.     Physical Exam Updated Vital Signs BP 119/79 (BP Location: Left Arm)   Pulse 78   Temp 97.8 F (36.6 C) (Temporal)   Resp 14   Ht 5\' 1"  (1.549 m)   Wt 62.6 kg (138 lb)   LMP 07/30/2016 Comment: neg preg test  SpO2 95%   BMI 26.07 kg/m   Physical Exam  Constitutional: She is oriented to person, place, and time. She appears well-developed.  HENT:  Head: Normocephalic.  Eyes: Conjunctivae and EOM are normal. No scleral icterus.  Neck: Neck supple. No thyromegaly present.  Cardiovascular: Normal rate and regular rhythm.  Exam reveals no gallop and no friction rub.   No murmur heard. Pulmonary/Chest: No stridor. She has no wheezes. She has no rales. She exhibits no tenderness.  Abdominal: She exhibits no distension. There is no tenderness. There is no rebound.  Musculoskeletal: Normal range of motion. She exhibits no edema.  Tender lumbar spine and pos straight leg raise left  Lymphadenopathy:    She has no cervical adenopathy.  Neurological: She is oriented to person, place, and time. She exhibits normal muscle tone. Coordination normal.  Skin: No rash noted. No erythema.  Psychiatric: She has a normal mood and affect. Her behavior is normal.     ED Treatments / Results  Labs (all labs ordered are listed, but only abnormal results are displayed) Labs Reviewed  CBC WITH DIFFERENTIAL/PLATELET - Abnormal; Notable for the following:       Result Value   Platelets 110 (*)    All other  components within normal limits  BASIC METABOLIC PANEL - Abnormal; Notable for the following:    Potassium 3.4 (*)    All other components within normal limits  URINALYSIS, ROUTINE W REFLEX MICROSCOPIC - Abnormal; Notable for the following:    APPearance CLOUDY (*)    All other components within normal limits  PREGNANCY, URINE    EKG  EKG Interpretation None       Radiology Dg Lumbar Spine Complete  Result Date: 08/06/2016 CLINICAL DATA:  31 year old with chronic low back pain which has acutely worsened over the past week, associated with radicular pain into the left hip. No known injuries. EXAM: LUMBAR SPINE - COMPLETE 4+ VIEW COMPARISON:  Bone window images from CT abdomen and pelvis 01/06/2016, 02/08/2015 and earlier.  Lumbar spine x-rays 08/28/2015. FINDINGS: Five non rib-bearing lumbar vertebrae with anatomic alignment. No fractures. Well-preserved disk spaces. No pars defects. No significant facet arthropathy. No significant spondylosis. Visualized sacroiliac joints intact. IMPRESSION: Normal examination. Electronically Signed   By: Hulan Saashomas  Lawrence M.D.   On: 08/06/2016 16:04    Procedures Procedures (including critical care time)  Medications Ordered in ED Medications  HYDROmorphone (DILAUDID) injection 1 mg (1 mg Intramuscular Given 08/06/16 1528)     Initial Impression / Assessment and Plan / ED Course  I have reviewed the triage vital signs and the nursing notes.  Pertinent labs & imaging results that were available during my care of the patient were reviewed by me and considered in my medical decision making (see chart for details).     Pt with sciatica.  Pt rx with prednisone, vicodin and flexeril  Final Clinical Impressions(s) / ED Diagnoses   Final diagnoses:  Strain of lumbar region, initial encounter    New Prescriptions New Prescriptions   CYCLOBENZAPRINE (FLEXERIL) 10 MG TABLET    Take 1 tablet (10 mg total) by mouth 3 (three) times daily as needed  for muscle spasms.   HYDROCODONE-ACETAMINOPHEN (NORCO/VICODIN) 5-325 MG TABLET    Take 1 tablet by mouth every 6 (six) hours as needed for moderate pain.   PREDNISONE (DELTASONE) 20 MG TABLET    2 tabs po daily x 3 days     Bethann BerkshireZammit, Yasenia Reedy, MD 08/06/16 [phone]

## 2016-08-06 NOTE — Discharge Instructions (Signed)
Follow up with Dr. Romeo AppleHarrison or another doctor in one week.

## 2016-08-06 NOTE — ED Triage Notes (Signed)
Pt reports low back pain going into left hip "for quite a while".  Nausea after eating for the past 3 weeks.

## 2016-09-17 ENCOUNTER — Encounter (HOSPITAL_COMMUNITY): Payer: Self-pay | Admitting: *Deleted

## 2016-09-17 ENCOUNTER — Emergency Department (HOSPITAL_COMMUNITY)
Admission: EM | Admit: 2016-09-17 | Discharge: 2016-09-17 | Disposition: A | Discharge status: Home / Self Care | Payer: Self-pay | Attending: Emergency Medicine | Admitting: Emergency Medicine

## 2016-09-17 DIAGNOSIS — K029 Dental caries, unspecified: Secondary | ICD-10-CM

## 2016-09-17 DIAGNOSIS — K047 Periapical abscess without sinus: Secondary | ICD-10-CM | POA: Insufficient documentation

## 2016-09-17 DIAGNOSIS — F17210 Nicotine dependence, cigarettes, uncomplicated: Secondary | ICD-10-CM | POA: Insufficient documentation

## 2016-09-17 DIAGNOSIS — Z79899 Other long term (current) drug therapy: Secondary | ICD-10-CM | POA: Insufficient documentation

## 2016-09-17 MED ORDER — ACETAMINOPHEN 325 MG PO TABS
650.0000 mg | ORAL_TABLET | Freq: Once | ORAL | Status: AC
  Administered 2016-09-17: 650 mg via ORAL
  Filled 2016-09-17: qty 2

## 2016-09-17 MED ORDER — CLINDAMYCIN HCL 150 MG PO CAPS
300.0000 mg | ORAL_CAPSULE | Freq: Once | ORAL | Status: AC
  Administered 2016-09-17: 300 mg via ORAL
  Filled 2016-09-17: qty 2

## 2016-09-17 MED ORDER — CLINDAMYCIN HCL 150 MG PO CAPS: 150.0000 mg | ORAL_CAPSULE | Freq: Four times a day (QID) | ORAL | 0 refills | Status: DC

## 2016-09-17 MED ORDER — TRAMADOL HCL 50 MG PO TABS: ORAL_TABLET | ORAL | 0 refills | Status: DC

## 2016-09-17 MED ORDER — ONDANSETRON HCL 4 MG PO TABS
4.0000 mg | ORAL_TABLET | Freq: Once | ORAL | Status: AC
  Administered 2016-09-17: 4 mg via ORAL
  Filled 2016-09-17: qty 1

## 2016-09-17 MED ORDER — IBUPROFEN 800 MG PO TABS
800.0000 mg | ORAL_TABLET | Freq: Once | ORAL | Status: AC
  Administered 2016-09-17: 800 mg via ORAL
  Filled 2016-09-17: qty 1

## 2016-09-17 NOTE — ED Provider Notes (Signed)
[facility]-EMER[facility]ENCY DEPT Provider Note   CSN: [id number] Arrival date & time: 09/17/16  1037     History   Chief Complaint Chief Complaint  Patient presents with  . Dental Pain    HPI Ashley Levy is a 31 y.o. female.  The history is provided by the patient.  Dental Pain   This is a chronic problem. The current episode started more than 2 days ago. The problem occurs hourly. The problem has been gradually worsening. The pain is moderate. She has tried acetaminophen for the symptoms.    Past Medical History:  Diagnosis Date  . Contraceptive management 08/02/2013  . Encounter for Nexplanon removal 08/02/2013  . HSV-2 infection   . Polycystic ovarian syndrome   . Postpartum depression 07/07/2014    Patient Active Problem List   Diagnosis Date Noted  . Contraceptive management 07/07/2014  . Postpartum depression 07/07/2014  . Active labor at term 05/21/2014  . PUPPP (pruritic urticarial papules and plaques of pregnancy) 05/17/2014  . Supervision of normal first pregnancy 11/01/2013  . Smoker 11/01/2013  . 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 NO PAST SURGERIES    . OPEN REDUCTION INTERNAL FIXATION (ORIF) PROXIMAL PHALANX Left 06/17/2015   Procedure: OPEN REDUCTION INTERNAL FIXATION (ORIF) LEFT SMALL FINGER PROXIMAL PHALANX FRACTURE;  Surgeon: Dominica Severin, MD;  Location: [facility] OR;  Service: Orthopedics;  Laterality: Left;    OB History    Gravida Para Term Preterm AB Living   1 1 1     1    SAB TAB Ectopic Multiple Live Births         0 1       Home Medications    Prior to Admission medications   Medication Sig Start Date End Date Taking? Authorizing Provider  acetaminophen (TYLENOL) 325 MG tablet Take 650 mg by mouth every 6 (six) hours as needed.    [provider]  cyclobenzaprine (FLEXERIL) 10 MG tablet Take 1 tablet (10 mg total) by mouth 3 (three) times daily as needed for muscle  spasms. 08/06/16   Bethann Berkshire, MD  HYDROcodone-acetaminophen (NORCO/VICODIN) 5-325 MG tablet Take 1 tablet by mouth every 6 (six) hours as needed for moderate pain. 08/06/16   Bethann Berkshire, MD  ibuprofen (ADVIL,MOTRIN) 600 MG tablet Take 1 tablet (600 mg total) by mouth every 6 (six) hours as needed. 08/28/15   Loren Racer, MD  methocarbamol (ROBAXIN) 500 MG tablet Take 2 tablets (1,000 mg total) by mouth every 8 (eight) hours as needed for muscle spasms. 08/28/15   Loren Racer, MD  ondansetron (ZOFRAN) 4 MG tablet Take 1 tablet (4 mg total) by mouth every 8 (eight) hours as needed for nausea or vomiting. Patient not taking: Reported on 08/06/2016 08/28/15   Loren Racer, MD  oxyCODONE (ROXICODONE) 5 MG immediate release tablet Take 1 tablet (5 mg total) by mouth every 4 (four) hours as needed for severe pain. Patient not taking: Reported on 08/28/2015 06/17/15   Karie Chimera, PA-C  predniSONE (DELTASONE) 20 MG tablet 2 tabs po daily x 3 days 08/06/16   Bethann Berkshire, MD    Family History Family History  Problem Relation Age of Onset  . Asthma Mother   . Cancer Paternal Grandfather   . Psoriasis Maternal Grandmother   . Asthma Maternal Grandfather     Social History Social History  Substance Use Topics  . Smoking status: Current Every Day Smoker  Packs/day: 1.00    Years: 6.00    Types: Cigarettes  . Smokeless tobacco: Never Used  . Alcohol use Yes     Comment: occasionally     Allergies   Patient has no known allergies.   Review of Systems Review of Systems  Constitutional: Negative for activity change, appetite change and fever.       All ROS Neg except as noted in HPI  HENT: Negative for nosebleeds.   Eyes: Negative for photophobia and discharge.  Respiratory: Negative for cough, shortness of breath and wheezing.   Cardiovascular: Negative for chest pain and palpitations.  Gastrointestinal: Negative for abdominal pain, blood in stool, nausea and  vomiting.  Genitourinary: Negative for dysuria, frequency and hematuria.  Musculoskeletal: Negative for arthralgias, back pain and neck pain.  Skin: Negative.   Neurological: Negative for dizziness, seizures and speech difficulty.  Psychiatric/Behavioral: Negative for confusion and hallucinations.     Physical Exam Updated Vital Signs BP (!) 146/95 (BP Location: Left Arm)   Pulse 79   Temp 98.4 F (36.9 C) (Oral)   Resp 16   Ht 5\' 1"  (1.549 m)   Wt 63.5 kg (140 lb)   LMP 08/27/2016   SpO2 100%   BMI 26.45 kg/m   Physical Exam  Constitutional: She is oriented to person, place, and time. She appears well-developed and well-nourished.  Non-toxic appearance.  HENT:  Head: Normocephalic.  Right Ear: Tympanic membrane and external ear normal.  Left Ear: Tympanic membrane and external ear normal.  There are multiple dental caries noted of the upper and lower jaw area. There is evidence of gum disease. There is swelling of the gums, but no visible abscess. The airway is patent. The uvula is in the midline. There is no swelling under the tongue. Patient handles secretions without problem.  Eyes: Pupils are equal, round, and reactive to light. EOM and lids are normal.  Neck: Normal range of motion. Neck supple. Carotid bruit is not present.  Cardiovascular: Normal rate, regular rhythm, normal heart sounds, intact distal pulses and normal pulses.   Pulmonary/Chest: Breath sounds normal. No respiratory distress.  Abdominal: Soft. Bowel sounds are normal. There is no tenderness. There is no guarding.  Musculoskeletal: Normal range of motion.  Lymphadenopathy:       Head (right side): No submandibular adenopathy present.       Head (left side): No submandibular adenopathy present.    She has cervical adenopathy.  Neurological: She is alert and oriented to person, place, and time. She has normal strength. No cranial nerve deficit or sensory deficit.  Skin: Skin is warm and dry.   Psychiatric: She has a normal mood and affect. Her speech is normal.  Nursing note and vitals reviewed.    ED Treatments / Results  Labs (all labs ordered are listed, but only abnormal results are displayed) Labs Reviewed - No data to display  EKG  EKG Interpretation None       Radiology No results found.  Procedures Procedures (including critical care time)  Medications Ordered in ED Medications - No data to display   Initial Impression / Assessment and Plan / ED Course  I have reviewed the triage vital signs and the nursing notes.  Pertinent labs & imaging results that were available during my care of the patient were reviewed by me and considered in my medical decision making (see chart for details).       Final Clinical Impressions(s) / ED Diagnoses MDM Vital reviewed. Patient  has multiple dental caries, gum swelling, and evidence of gum disease. Patient will be started on clindamycin. She will use ibuprofen and Tylenol for mild pain. A short course of Ultram was given for more severe pain. I have emphasized to the patient both verbally and in writing the importance of her seeing a dentist as soon as possible to prevent further advancement of infection. I've also given the patient resources for dental assistance. Patient acknowledges understanding of the instructions and the need for the specialist.    Final diagnoses:  Infected dental caries    New Prescriptions New Prescriptions   CLINDAMYCIN (CLEOCIN) 150 MG CAPSULE    Take 1 capsule (150 mg total) by mouth every 6 (six) hours.   TRAMADOL (ULTRAM) 50 MG TABLET    1 or 2 po q6h prn pain     Ivery QualeBryant, Higinio Grow, PA-C 09/17/16 1129    Ivery QualeBryant, Jaton Eilers, PA-C 09/17/16 1137    Bethann BerkshireZammit, Joseph, MD 09/19/16 [phone]

## 2016-09-17 NOTE — ED Triage Notes (Signed)
Pt comes in with bilateral upper dental pain and swelling that started 3 days ago. Pt states she has poor dentition.

## 2016-09-17 NOTE — Discharge Instructions (Signed)
Your blood pressure is elevated at 146/95, otherwise your vital signs within normal limits. Your examination reveals gum disease, multiple dental caries, and evidence of infection with swelling of the gums at multiple sites. It is extremely important that you see a dentist concerning these issues as soon as possible to prevent spread of infection. Please use clindamycin, 600 mg of ibuprofen, and 500 mg of Tylenol with breakfast, lunch, dinner, and at bedtime. May use Ultram for more severe pain.This medication may cause drowsiness. Please do not drink, drive, or participate in activity that requires concentration while taking this medication.

## 2016-09-17 NOTE — ED Notes (Signed)
Pt.'s face swollen upon assessment.

## 2017-02-01 ENCOUNTER — Emergency Department (HOSPITAL_COMMUNITY)
Admission: EM | Admit: 2017-02-01 | Discharge: 2017-02-02 | Disposition: A | Discharge status: Home / Self Care | Payer: Self-pay | Attending: Emergency Medicine | Admitting: Emergency Medicine

## 2017-02-01 ENCOUNTER — Other Ambulatory Visit: Payer: Self-pay

## 2017-02-01 ENCOUNTER — Encounter (HOSPITAL_COMMUNITY): Payer: Self-pay | Admitting: Emergency Medicine

## 2017-02-01 DIAGNOSIS — Z79899 Other long term (current) drug therapy: Secondary | ICD-10-CM | POA: Insufficient documentation

## 2017-02-01 DIAGNOSIS — O219 Vomiting of pregnancy, unspecified: Secondary | ICD-10-CM | POA: Insufficient documentation

## 2017-02-01 DIAGNOSIS — Z3A00 Weeks of gestation of pregnancy not specified: Secondary | ICD-10-CM | POA: Insufficient documentation

## 2017-02-01 DIAGNOSIS — O99331 Smoking (tobacco) complicating pregnancy, first trimester: Secondary | ICD-10-CM | POA: Insufficient documentation

## 2017-02-01 DIAGNOSIS — R112 Nausea with vomiting, unspecified: Secondary | ICD-10-CM | POA: Insufficient documentation

## 2017-02-01 DIAGNOSIS — F17210 Nicotine dependence, cigarettes, uncomplicated: Secondary | ICD-10-CM | POA: Insufficient documentation

## 2017-02-01 LAB — BASIC METABOLIC PANEL
ANION GAP: 11 (ref 5–15)
BUN: 13 mg/dL (ref 6–20)
CHLORIDE: 101 mmol/L (ref 101–111)
CO2: 22 mmol/L (ref 22–32)
Calcium: 9.3 mg/dL (ref 8.9–10.3)
Creatinine, Ser: 0.56 mg/dL (ref 0.44–1.00)
GFR calc Af Amer: >60 mL/min (ref >60)
GLUCOSE: 128 mg/dL — AB (ref 65–99)
POTASSIUM: 3.3 mmol/L — AB (ref 3.5–5.1)
Sodium: 134 mmol/L — ABNORMAL LOW (ref 135–145)

## 2017-02-01 LAB — CBC
HEMATOCRIT: 44.8 % (ref 36.0–46.0)
HEMOGLOBIN: 15.1 g/dL — AB (ref 12.0–15.0)
MCH: 31.3 pg (ref 26.0–34.0)
MCHC: 33.7 g/dL (ref 30.0–36.0)
MCV: 92.9 fL (ref 78.0–100.0)
PLATELETS: 153 10*3/uL (ref 150–400)
RBC: 4.82 MIL/uL (ref 3.87–5.11)
RDW: 13 % (ref 11.5–15.5)
WBC: 9.6 10*3/uL (ref 4.0–10.5)

## 2017-02-01 LAB — I-STAT BETA HCG BLOOD, ED (MC, WL, AP ONLY): I-stat hCG, quantitative: >2000 m[IU]/mL — ABNORMAL HIGH (ref <5)

## 2017-02-01 MED ORDER — ONDANSETRON HCL 4 MG/2ML IJ SOLN
4.0000 mg | Freq: Once | INTRAMUSCULAR | Status: AC
  Administered 2017-02-01: 4 mg via INTRAVENOUS
  Filled 2017-02-01: qty 2

## 2017-02-01 MED ORDER — METOCLOPRAMIDE HCL 5 MG/ML IJ SOLN
10.0000 mg | Freq: Once | INTRAMUSCULAR | Status: AC
  Administered 2017-02-01: 10 mg via INTRAVENOUS
  Filled 2017-02-01: qty 2

## 2017-02-01 MED ORDER — SODIUM CHLORIDE 0.9 % IV BOLUS (SEPSIS)
1000.0000 mL | Freq: Once | INTRAVENOUS | Status: AC
  Administered 2017-02-01: 1000 mL via INTRAVENOUS

## 2017-02-01 NOTE — ED Provider Notes (Signed)
[facility] Provider [facility]   CSN: [id number] Arrival date & time: 02/01/17  2208     History   Chief Complaint Chief Complaint  Patient presents with  . Emesis    HPI Ashley PeoplesCasey Levy is a 31 y.o. female.  Patient presents from home for evaluation of nausea and vomiting.  Patient reports that she took a home pregnancy test last week and it was positive.  She is not sure when her last menstrual period was, she has a regular.  She has been having occasional morning nausea, but today she has had persistent nausea and vomiting, has not been able to eat or drink anything.  She has upper abdominal cramping associated.  She has not had any fever, diarrhea, upper respiratory infection symptoms.  She reports that she has been feeling very weak today and she gets dizzy when she stands up.      Past Medical History:  Diagnosis Date  . Contraceptive management 08/02/2013  . Encounter for Nexplanon removal 08/02/2013  . HSV-2 infection   . Polycystic ovarian syndrome   . Postpartum depression 07/07/2014    Patient Active Problem List   Diagnosis Date Noted  . Contraceptive management 07/07/2014  . Postpartum depression 07/07/2014  . Active labor at term 05/21/2014  . PUPPP (pruritic urticarial papules and plaques of pregnancy) 05/17/2014  . Supervision of normal first pregnancy 11/01/2013  . Smoker 11/01/2013  . 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 NO PAST SURGERIES    . OPEN REDUCTION INTERNAL FIXATION (ORIF) PROXIMAL PHALANX Left 06/17/2015   Procedure: OPEN REDUCTION INTERNAL FIXATION (ORIF) LEFT SMALL FINGER PROXIMAL PHALANX FRACTURE;  Surgeon: Dominica SeverinWilliam Gramig, MD;  Location: [facility] OR;  Service: Orthopedics;  Laterality: Left;    OB History    Gravida Para Term Preterm AB Living   2 1 1     1    SAB TAB Ectopic Multiple Live Births         0 1       Home Medications    Prior to  Admission medications   Medication Sig Start Date End Date Taking? Authorizing Provider  acetaminophen (TYLENOL) 325 MG tablet Take 650 mg by mouth every 6 (six) hours as needed.   Yes [provider]  Pediatric Multivit-Minerals-C (FLINTSTONES COMPLETE PO) Take 2 tablets by mouth daily.   Yes [provider]  Doxylamine-Pyridoxine 10-10 MG TBEC Take 2 tablets by mouth at bedtime. 02/02/17   Gilda CreasePollina, Lanie Schelling J, MD  metoCLOPramide (REGLAN) 10 MG tablet Take 1 tablet (10 mg total) by mouth every 6 (six) hours as needed for nausea (nausea/headache). 02/02/17   Gilda CreasePollina, Marlaine Arey J, MD    Family History Family History  Problem Relation Age of Onset  . Asthma Mother   . Cancer Paternal Grandfather   . Psoriasis Maternal Grandmother   . Asthma Maternal Grandfather     Social History Social History   Tobacco Use  . Smoking status: Current Every Day Smoker    Packs/day: 1.00    Years: 6.00    Pack years: 6.00    Types: Cigarettes  . Smokeless tobacco: Never Used  Substance Use Topics  . Alcohol use: Yes    Comment: occasionally  . Drug use: Yes    Types: Marijuana     Allergies   Patient has no known allergies.   Review of Systems Review of Systems  Gastrointestinal: Positive for nausea and  vomiting.  Neurological: Positive for dizziness.  All other systems reviewed and are negative.    Physical Exam Updated Vital Signs BP 111/71   Pulse 66   Temp 98.7 F (37.1 C)   Resp 17   Wt 63.5 kg (140 lb)   LMP  (LMP Unknown)   SpO2 100%   BMI 26.45 kg/m   Physical Exam  Constitutional: She is oriented to person, place, and time. She appears well-developed and well-nourished. No distress.  HENT:  Head: Normocephalic and atraumatic.  Right Ear: Hearing normal.  Left Ear: Hearing normal.  Nose: Nose normal.  Mouth/Throat: Oropharynx is clear and moist and mucous membranes are normal.  Eyes: Conjunctivae and EOM are normal. Pupils are equal,  round, and reactive to light.  Neck: Normal range of motion. Neck supple.  Cardiovascular: Regular rhythm, S1 normal and S2 normal. Exam reveals no gallop and no friction rub.  No murmur heard. Pulmonary/Chest: Effort normal and breath sounds normal. No respiratory distress. She exhibits no tenderness.  Abdominal: Soft. Normal appearance and bowel sounds are normal. There is no hepatosplenomegaly. There is no tenderness. There is no rebound, no guarding, no tenderness at McBurney's point and negative Murphy's sign. No hernia.  Musculoskeletal: Normal range of motion.  Neurological: She is alert and oriented to person, place, and time. She has normal strength. No cranial nerve deficit or sensory deficit. Coordination normal. GCS eye subscore is 4. GCS verbal subscore is 5. GCS motor subscore is 6.  Skin: Skin is warm, dry and intact. No rash noted. No cyanosis.  Psychiatric: She has a normal mood and affect. Her speech is normal and behavior is normal. Thought content normal.  Nursing note and vitals reviewed.    ED Treatments / Results  Labs (all labs ordered are listed, but only abnormal results are displayed) Labs Reviewed  CBC - Abnormal; Notable for the following components:      Result Value   Hemoglobin 15.1 (*)    All other components within normal limits  BASIC METABOLIC PANEL - Abnormal; Notable for the following components:   Sodium 134 (*)    Potassium 3.3 (*)    Glucose, Bld 128 (*)    All other components within normal limits  I-STAT BETA HCG BLOOD, ED ([facility], [facility], [facility] ONLY) - Abnormal; Notable for the following components:   I-stat hCG, quantitative >2,000.0 (*)    All other components within normal limits  HCG, QUANTITATIVE, PREGNANCY    EKG  EKG Interpretation None       Radiology No results found.  Procedures Procedures (including critical care time)  Medications Ordered in ED Medications  sodium chloride 0.9 % bolus 1,000 mL (1,000 mLs Intravenous New  Bag/Given 02/01/17 2253)  ondansetron (ZOFRAN) injection 4 mg (4 mg Intravenous Given 02/01/17 2254)  metoCLOPramide (REGLAN) injection 10 mg (10 mg Intravenous Given 02/01/17 2344)     Initial Impression / Assessment and Plan / ED Course  I have reviewed the triage vital signs and the nursing notes.  Pertinent labs & imaging results that were available during my care of the patient were reviewed by me and considered in my medical decision making (see chart for details).     Patient presents to the emergency department for evaluation of nausea and vomiting in early pregnancy.  Patient has not yet had prenatal care, is not sure how far along she is because of irregular periods.  Patient not experiencing pelvic pain, vaginal bleeding, vaginal her complaint is nausea and  vomiting with some upper abdominal cramping associated with the vomiting which is consistent with vomiting in early pregnancy.  She does report morning sickness up until today when she started having the vomiting.  No flulike symptoms.  Patient feeling much improvement after IV fluids, Zofran and Reglan.  She will be discharged, prescription for Diclegis and Reglan, follow-up with OB/GYN.  Return for vaginal bleeding or pain.  Final Clinical Impressions(s) / ED Diagnoses   Final diagnoses:  Nausea and vomiting in pregnancy    ED Discharge Orders        Ordered    Doxylamine-Pyridoxine 10-10 MG TBEC  Daily at bedtime     02/02/17 0019    metoCLOPramide (REGLAN) 10 MG tablet  Every 6 hours PRN     02/02/17 0019       Gilda CreasePollina, Tiki Tucciarone J, MD 02/02/17 [phone]

## 2017-02-01 NOTE — ED Triage Notes (Signed)
Pt states took pregnancy test and it was positive one week ago. Pt here today due to vomiting.

## 2017-02-02 LAB — HCG, QUANTITATIVE, PREGNANCY: hCG, Beta Chain, Quant, S: 95699 m[IU]/mL — ABNORMAL HIGH (ref <5)

## 2017-02-02 MED ORDER — DOXYLAMINE-PYRIDOXINE 10-10 MG PO TBEC: 2.0000 | DELAYED_RELEASE_TABLET | Freq: Every day | ORAL | 2 refills | Status: DC

## 2017-02-02 MED ORDER — METOCLOPRAMIDE HCL 10 MG PO TABS: 10.0000 mg | ORAL_TABLET | Freq: Four times a day (QID) | ORAL | 0 refills | Status: DC | PRN

## 2017-02-02 NOTE — ED Notes (Signed)
Pt ambulatory to waiting room. Pt verbalized understanding of discharge instructions.   

## 2017-02-11 NOTE — L&D Delivery Note (Signed)
Operative Delivery Note At 10:21 AM a viable female was delivered via Vaginal, Vacuum Investment banker, operational(Extractor).  Presentation: vertex; Position: Right,, Occiput,, Anterior; Station: +3.  Verbal consent: obtained from patient.  Risks and benefits discussed in detail.  Risks include, but are not limited to the risks of anesthesia, bleeding, infection, damage to maternal tissues, fetal cephalhematoma.  There is also the risk of inability to effect vaginal delivery of the head, or shoulder dystocia that cannot be resolved by established maneuvers, leading to the need for emergency cesarean section.   Indication: fetal bradycardia  Patient consented. Mity vac placed and vacuum increased with contractions. Vacuum used during two contractions with baby delivering on second. No shoulder dystocia.   APGAR: 9, 9; weight pending Placenta status: spontaneous, intact Cord: 3vc  with the following complications: none .  Cord pH: n/a  Anesthesia:  epidural Instruments: Mity Vac Episiotomy: None Lacerations:  Right labial and periurethral Suture Repair: 3.0 monocryl Est. Blood Loss (mL):    Mom to postpartum.  Baby to Couplet care / Skin to Skin.  Ashley Levy [name] 09/15/2017, 10:39 AM

## 2017-03-14 ENCOUNTER — Other Ambulatory Visit: Payer: Self-pay | Admitting: Obstetrics and Gynecology

## 2017-03-14 DIAGNOSIS — O3680X0 Pregnancy with inconclusive fetal viability, not applicable or unspecified: Secondary | ICD-10-CM

## 2017-03-15 ENCOUNTER — Emergency Department (HOSPITAL_COMMUNITY)
Admission: EM | Admit: 2017-03-15 | Discharge: 2017-03-15 | Disposition: A | Discharge status: Home / Self Care | Payer: Self-pay | Attending: Emergency Medicine | Admitting: Emergency Medicine

## 2017-03-15 ENCOUNTER — Encounter (HOSPITAL_COMMUNITY): Payer: Self-pay | Admitting: *Deleted

## 2017-03-15 DIAGNOSIS — H6690 Otitis media, unspecified, unspecified ear: Secondary | ICD-10-CM

## 2017-03-15 DIAGNOSIS — Z79899 Other long term (current) drug therapy: Secondary | ICD-10-CM | POA: Insufficient documentation

## 2017-03-15 DIAGNOSIS — H6691 Otitis media, unspecified, right ear: Secondary | ICD-10-CM | POA: Insufficient documentation

## 2017-03-15 DIAGNOSIS — J069 Acute upper respiratory infection, unspecified: Secondary | ICD-10-CM | POA: Insufficient documentation

## 2017-03-15 DIAGNOSIS — F17210 Nicotine dependence, cigarettes, uncomplicated: Secondary | ICD-10-CM | POA: Insufficient documentation

## 2017-03-15 HISTORY — DX: Encounter for supervision of normal pregnancy, unspecified, unspecified trimester: Z34.90

## 2017-03-15 LAB — POC URINE PREG, ED: PREG TEST UR: POSITIVE — AB

## 2017-03-15 MED ORDER — AMOXICILLIN 250 MG PO CAPS
500.0000 mg | ORAL_CAPSULE | Freq: Once | ORAL | Status: AC
  Administered 2017-03-15: 500 mg via ORAL
  Filled 2017-03-15: qty 2

## 2017-03-15 MED ORDER — AMOXICILLIN 500 MG PO CAPS: 500.0000 mg | ORAL_CAPSULE | Freq: Three times a day (TID) | ORAL | 0 refills | Status: DC

## 2017-03-15 NOTE — ED Provider Notes (Signed)
[facility] Provider Note   CSN: [id number] Arrival date & time: 03/15/17  2046     History   Chief Complaint Chief Complaint  Patient presents with  . Fever    HPI Ledell PeoplesCasey Sainz is a 32 y.o. female.  HPI Patient presents with cough and nasal congestion.  Has had for the last week for the last couple days she has developed right ear pain.  Has had some mild sputum production.  Has had some fevers a couple days ago.  She is currently pregnant at this time.  Has OB follow-up tomorrow.  Has had some vomiting with the coughing.  Several family members have had similar symptoms to the cough.  Pain in the ear is worse with swallowing.  No dysuria.  No vaginal bleeding or discharge. Past Medical History:  Diagnosis Date  . Contraceptive management 08/02/2013  . Encounter for Nexplanon removal 08/02/2013  . HSV-2 infection   . Polycystic ovarian syndrome   . Postpartum depression 07/07/2014  . Pregnant and not yet delivered     Patient Active Problem List   Diagnosis Date Noted  . Contraceptive management 07/07/2014  . Postpartum depression 07/07/2014  . Active labor at term 05/21/2014  . PUPPP (pruritic urticarial papules and plaques of pregnancy) 05/17/2014  . Supervision of normal first pregnancy 11/01/2013  . Smoker 11/01/2013  . 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 NO PAST SURGERIES    . OPEN REDUCTION INTERNAL FIXATION (ORIF) PROXIMAL PHALANX Left 06/17/2015   Procedure: OPEN REDUCTION INTERNAL FIXATION (ORIF) LEFT SMALL FINGER PROXIMAL PHALANX FRACTURE;  Surgeon: Dominica SeverinWilliam Gramig, MD;  Location: [facility] OR;  Service: Orthopedics;  Laterality: Left;    OB History    Gravida Para Term Preterm AB Living   2 1 1     1    SAB TAB Ectopic Multiple Live Births         0 1       Home Medications    Prior to Admission medications   Medication Sig Start Date End Date Taking? Authorizing  Provider  acetaminophen (TYLENOL) 325 MG tablet Take 650 mg by mouth every 6 (six) hours as needed.    [provider]  amoxicillin (AMOXIL) 500 MG capsule Take 1 capsule (500 mg total) by mouth 3 (three) times daily. 03/15/17   Benjiman CorePickering, Zeddie Njie, MD  Doxylamine-Pyridoxine 10-10 MG TBEC Take 2 tablets by mouth at bedtime. 02/02/17   Gilda CreasePollina, Christopher J, MD  metoCLOPramide (REGLAN) 10 MG tablet Take 1 tablet (10 mg total) by mouth every 6 (six) hours as needed for nausea (nausea/headache). 02/02/17   Gilda CreasePollina, Christopher J, MD  Pediatric Multivit-Minerals-C [facility](FLINTSTONES COMPLETE PO) Take 2 tablets by mouth daily.    [provider]    Family History Family History  Problem Relation Age of Onset  . Asthma Mother   . Cancer Paternal Grandfather   . Psoriasis Maternal Grandmother   . Asthma Maternal Grandfather     Social History Social History   Tobacco Use  . Smoking status: Current Every Day Smoker    Packs/day: 1.00    Years: 6.00    Pack years: 6.00    Types: Cigarettes  . Smokeless tobacco: Never Used  Substance Use Topics  . Alcohol use: Yes    Comment: occasionally  . Drug use: Yes    Types: Marijuana     Allergies   Patient has no known allergies.  Review of Systems Review of Systems  Constitutional: Positive for fever. Negative for appetite change.  HENT: Positive for congestion and ear pain.   Respiratory: Positive for cough.   Cardiovascular: Negative for chest pain.  Gastrointestinal: Positive for vomiting.  Endocrine: Negative for polyuria.  Genitourinary: Negative for flank pain.  Musculoskeletal: Negative for back pain.  Skin: Negative for wound.  Neurological: Positive for headaches. Negative for syncope.  Hematological: Negative for adenopathy.  Psychiatric/Behavioral: Negative for confusion.     Physical Exam Updated Vital Signs BP 114/67   Pulse (!) 106   Temp 98.7 F (37.1 C) (Oral)   Resp (!) 22   LMP  (LMP Unknown)    SpO2 99%   Physical Exam  Constitutional: She appears well-developed.  HENT:  Head: Atraumatic.  Mild posterior pharyngeal erythema without exudate.  Left TM normal.  Right TM with bulging and erythema.  Eyes: Pupils are equal, round, and reactive to light.  Neck: Neck supple.  Cardiovascular: Normal rate.  Pulmonary/Chest: Effort normal. No respiratory distress. She has no wheezes. She has no rales.  Abdominal: There is no tenderness.  Musculoskeletal: She exhibits no edema.  Neurological: She is alert.  Skin: Skin is warm. Capillary refill takes less than 2 seconds.     ED Treatments / Results  Labs (all labs ordered are listed, but only abnormal results are displayed) Labs Reviewed  POC URINE PREG, ED - Abnormal; Notable for the following components:      Result Value   Preg Test, Ur POSITIVE (*)    All other components within normal limits    EKG  EKG Interpretation None       Radiology No results found.  Procedures Procedures (including critical care time)  Medications Ordered in ED Medications  amoxicillin (AMOXIL) capsule 500 mg (not administered)     Initial Impression / Assessment and Plan / ED Course  I have reviewed the triage vital signs and the nursing notes.  Pertinent labs & imaging results that were available during my care of the patient were reviewed by me and considered in my medical decision making (see chart for details).     Patient with URI symptoms.  Cough sore throat and otitis media on right side.  Will treat with amoxicillin.  Follow-up with OB tomorrow for her prenatal care.  Final Clinical Impressions(s) / ED Diagnoses   Final diagnoses:  Acute otitis media, unspecified otitis media type  Upper respiratory tract infection, unspecified type    ED Discharge Orders        Ordered    amoxicillin (AMOXIL) 500 MG capsule  3 times daily     03/15/17 2121       Benjiman Core, MD 03/15/17 2135

## 2017-03-15 NOTE — ED Triage Notes (Signed)
Cough with nasal congestion x 1 week

## 2017-03-15 NOTE — Discharge Instructions (Signed)
Follow-up with the primary care doctor. 

## 2017-03-15 NOTE — ED Notes (Signed)
Pt reports that she is pregnant, has applied for medicaid for her pre natal   Here for a cough  VS WNL

## 2017-03-17 ENCOUNTER — Telehealth: Payer: Self-pay | Admitting: Women's Health

## 2017-03-17 ENCOUNTER — Ambulatory Visit (INDEPENDENT_AMBULATORY_CARE_PROVIDER_SITE_OTHER): Payer: Medicaid Other

## 2017-03-17 ENCOUNTER — Encounter: Payer: Self-pay | Admitting: Obstetrics and Gynecology

## 2017-03-17 ENCOUNTER — Other Ambulatory Visit: Payer: Self-pay | Admitting: Obstetrics and Gynecology

## 2017-03-17 ENCOUNTER — Other Ambulatory Visit: Payer: Medicaid Other

## 2017-03-17 DIAGNOSIS — O99331 Smoking (tobacco) complicating pregnancy, first trimester: Secondary | ICD-10-CM

## 2017-03-17 DIAGNOSIS — O3680X0 Pregnancy with inconclusive fetal viability, not applicable or unspecified: Secondary | ICD-10-CM

## 2017-03-17 DIAGNOSIS — Z3682 Encounter for antenatal screening for nuchal translucency: Secondary | ICD-10-CM

## 2017-03-17 DIAGNOSIS — F17210 Nicotine dependence, cigarettes, uncomplicated: Secondary | ICD-10-CM | POA: Diagnosis not present

## 2017-03-17 DIAGNOSIS — Z3A13 13 weeks gestation of pregnancy: Secondary | ICD-10-CM

## 2017-03-17 NOTE — Progress Notes (Signed)
US 13+6 wks,single IUP,posterior pl gr 0,normal ovaries bilat,NB present,NT 2.8 mm,crl 77.44 mm

## 2017-03-17 NOTE — Telephone Encounter (Signed)
Pt called asking what is safe to take for a cough. Informed pt that they recommend waiting until after [redacted] weeks gestation before taking medication. Referred pt to list of safe medications in the packet that she received from us. Advised to call back with any other problems or concerns. Pt verbalized understanding.

## 2017-03-19 LAB — INTEGRATED 1
CROWN RUMP LENGTH MAT SCREEN: 77.4 mm
Gest. Age on Collection Date: 13.6 weeks
MATERNAL AGE AT EDD: 30.7 a
Nuchal Translucency (NT): 2.8 mm
Number of Fetuses: 1
PAPP-A VALUE: 932.9 ng/mL
WEIGHT: 141 [lb_av]

## 2017-03-27 ENCOUNTER — Ambulatory Visit: Payer: Self-pay | Admitting: *Deleted

## 2017-03-27 ENCOUNTER — Encounter: Payer: Self-pay | Admitting: Women's Health

## 2017-04-04 ENCOUNTER — Ambulatory Visit (INDEPENDENT_AMBULATORY_CARE_PROVIDER_SITE_OTHER): Payer: Medicaid Other | Admitting: Women's Health

## 2017-04-04 ENCOUNTER — Ambulatory Visit: Payer: Medicaid Other | Admitting: *Deleted

## 2017-04-04 ENCOUNTER — Other Ambulatory Visit (HOSPITAL_COMMUNITY)
Admission: RE | Admit: 2017-04-04 | Discharge: 2017-04-04 | Disposition: A | Discharge status: Home / Self Care | Payer: Self-pay | Source: Ambulatory Visit | Attending: Obstetrics & Gynecology | Admitting: Obstetrics & Gynecology

## 2017-04-04 ENCOUNTER — Encounter: Payer: Self-pay | Admitting: Women's Health

## 2017-04-04 ENCOUNTER — Other Ambulatory Visit: Payer: Self-pay

## 2017-04-04 VITALS — BP 92/50 | HR 79 | Wt 149.0 lb

## 2017-04-04 DIAGNOSIS — O09892 Supervision of other high risk pregnancies, second trimester: Secondary | ICD-10-CM | POA: Insufficient documentation

## 2017-04-04 DIAGNOSIS — F17210 Nicotine dependence, cigarettes, uncomplicated: Secondary | ICD-10-CM

## 2017-04-04 DIAGNOSIS — R768 Other specified abnormal immunological findings in serum: Secondary | ICD-10-CM

## 2017-04-04 DIAGNOSIS — O9989 Other specified diseases and conditions complicating pregnancy, childbirth and the puerperium: Secondary | ICD-10-CM

## 2017-04-04 DIAGNOSIS — F418 Other specified anxiety disorders: Secondary | ICD-10-CM

## 2017-04-04 DIAGNOSIS — Z124 Encounter for screening for malignant neoplasm of cervix: Secondary | ICD-10-CM

## 2017-04-04 DIAGNOSIS — Z3482 Encounter for supervision of other normal pregnancy, second trimester: Secondary | ICD-10-CM

## 2017-04-04 DIAGNOSIS — Z3490 Encounter for supervision of normal pregnancy, unspecified, unspecified trimester: Secondary | ICD-10-CM | POA: Insufficient documentation

## 2017-04-04 DIAGNOSIS — O99342 Other mental disorders complicating pregnancy, second trimester: Secondary | ICD-10-CM

## 2017-04-04 DIAGNOSIS — Z3A16 16 weeks gestation of pregnancy: Secondary | ICD-10-CM | POA: Insufficient documentation

## 2017-04-04 DIAGNOSIS — F1120 Opioid dependence, uncomplicated: Secondary | ICD-10-CM

## 2017-04-04 DIAGNOSIS — Z331 Pregnant state, incidental: Secondary | ICD-10-CM

## 2017-04-04 DIAGNOSIS — F17200 Nicotine dependence, unspecified, uncomplicated: Secondary | ICD-10-CM

## 2017-04-04 DIAGNOSIS — R87610 Atypical squamous cells of undetermined significance on cytologic smear of cervix (ASC-US): Secondary | ICD-10-CM | POA: Insufficient documentation

## 2017-04-04 DIAGNOSIS — O99322 Drug use complicating pregnancy, second trimester: Secondary | ICD-10-CM | POA: Diagnosis not present

## 2017-04-04 DIAGNOSIS — Z1389 Encounter for screening for other disorder: Secondary | ICD-10-CM

## 2017-04-04 DIAGNOSIS — Z1379 Encounter for other screening for genetic and chromosomal anomalies: Secondary | ICD-10-CM

## 2017-04-04 DIAGNOSIS — Z363 Encounter for antenatal screening for malformations: Secondary | ICD-10-CM

## 2017-04-04 DIAGNOSIS — F1290 Cannabis use, unspecified, uncomplicated: Secondary | ICD-10-CM | POA: Insufficient documentation

## 2017-04-04 LAB — POCT URINALYSIS DIPSTICK
Glucose, UA: NEGATIVE
Ketones, UA: NEGATIVE
LEUKOCYTES UA: NEGATIVE
NITRITE UA: NEGATIVE
Protein, UA: NEGATIVE
RBC UA: NEGATIVE

## 2017-04-04 MED ORDER — SERTRALINE HCL 25 MG PO TABS: 25.0000 mg | ORAL_TABLET | Freq: Every day | ORAL | 6 refills | Status: DC

## 2017-04-04 NOTE — Progress Notes (Addendum)
LOW-RISK PREGNANCY VISIT Patient name: Emmelyn Schmale MRN [id number]  Date of birth: Nov 01, 1985 Chief Complaint:   Initial Prenatal Visit (c/o vaginal "soreness")  History of Present Illness:   Keina Mutch is a 32 y.o. G54P1001 female at [redacted]w[redacted]d with an Estimated Date of Delivery: 09/16/17 being seen today for ongoing management of a low-risk pregnancy.  Today she reports vaginal soreness, addiction to pain pills 'Roxy, hydrocodone, percocet, whatever I can get' unsure of how much she takes daily. Is interested in subutex therapy. Also reports depression/anxiety, no meds for years. Doesn't want to do counseling/therapy, is interested in starting back on meds. Denies SI/HI/II. Does have custody of her daughter  . Vag. Bleeding: None.  Movement: Absent. denies leaking of fluid. Review of Systems:   Pertinent items are noted in HPI Denies abnormal vaginal discharge w/ itching/odor/irritation, headaches, visual changes, shortness of breath, chest pain, abdominal pain, severe nausea/vomiting, or problems with urination or bowel movements unless otherwise stated above. Pertinent History Reviewed:  Reviewed past medical,surgical, social, obstetrical and family history.  Reviewed problem list, medications and allergies. Physical Assessment:   Vitals:   04/04/17 1013  BP: (!) 92/50  Pulse: 79  Weight: 149 lb (67.6 kg)  Body mass index is 28.15 kg/m.        Physical Examination:   General appearance: Well appearing, and in no distress  Mental status: Alert, oriented to person, place, and time  Skin: Warm & dry  Cardiovascular: Normal heart rate noted  Respiratory: Normal respiratory effort, no distress  Abdomen: Soft, gravid, nontender  Pelvic: pap obtained         Extremities: Edema: None  Fetal Status: Fetal Heart Rate (bpm): 158   Movement: Absent    Results for orders placed or performed in visit on 04/04/17 (from the past 24 hour(s))  POCT urinalysis dipstick   Collection Time: 04/04/17  10:19 AM  Result Value Ref Range   Color, UA     Clarity, UA     Glucose, UA neg    Bilirubin, UA     Ketones, UA neg    Spec Grav, UA  1.010 - 1.025   Blood, UA neg    pH, UA  5.0 - 8.0   Protein, UA neg    Urobilinogen, UA  0.2 or 1.0 E.U./dL   Nitrite, UA neg    Leukocytes, UA Negative Negative   Appearance     Odor      Assessment & Plan:  1) Low-risk pregnancy G2P1001 at [redacted]w[redacted]d with an Estimated Date of Delivery: 09/16/17   2) Opioid addiction, referred to [employer], pt spoke w/ Coy Saunas, pregnancy Mcaid is not active yet- pt going today to check on it, pt to call [facility] back when active to schedule appt  3) Dep/anx> rx zoloft 25mg  daily, understands it can take a few weeks to notice improvement  4) THC use> advised cessation  5) Smoker> Smokes 1pp/day, counseled x 3-76mins, advised cessation, discussed risks to fetus while pregnant, to infant pp, and to herself. Offered Quitline[place], accepted, referral sent.      Meds:  Meds ordered this encounter  Medications  . sertraline (ZOLOFT) 25 MG tablet    Sig: Take 1 tablet (25 mg total) by mouth daily.    Dispense:  30 tablet    Refill:  6    Order Specific Question:   Supervising Provider    Answer:   Lazaro Arms [[id number]]   Labs/procedures today: pap  Plan:  Continue routine obstetrical care   Reviewed: Preterm labor symptoms and general obstetric precautions including but not limited to vaginal bleeding, contractions, leaking of fluid and fetal movement were reviewed in detail with the patient.  All questions were answered  Follow-up: Return in about 2 weeks (around 04/18/2017) for HROB, ZO:XWRUEAVS:Anatomy.  Orders Placed This Encounter  Procedures  . Urine Culture  . US OB Comp + 14 Wk  . Pain Management Screening Profile (10S)  . Obstetric Panel, Including HIV  . Urinalysis, Routine w reflex microscopic  . Cystic Fibrosis Mutation 97  . INTEGRATED 2  . POCT urinalysis dipstick   Cheral MarkerKimberly R Booker CNM,  [facility]NP-BC 04/04/2017 11:20 AM

## 2017-04-04 NOTE — Addendum Note (Signed)
Addended by: Shawna ClampBOOKER, Elishua Radford R on: 04/04/2017 11:20 AM   Modules accepted: Orders

## 2017-04-04 NOTE — Patient Instructions (Signed)
[facility] [phone] CALL when your Medicaid is active to schedule an appointment

## 2017-04-05 LAB — OBSTETRIC PANEL, INCLUDING HIV
ANTIBODY SCREEN: NEGATIVE
BASOS: 0 %
Basophils Absolute: 0 10*3/uL (ref 0.0–0.2)
EOS (ABSOLUTE): 0.2 10*3/uL (ref 0.0–0.4)
Eos: 2 %
HEMATOCRIT: 35.9 % (ref 34.0–46.6)
HIV SCREEN 4TH GENERATION: NONREACTIVE
Hemoglobin: 12.3 g/dL (ref 11.1–15.9)
Hepatitis B Surface Ag: NEGATIVE
IMMATURE GRANS (ABS): 0 10*3/uL (ref 0.0–0.1)
Immature Granulocytes: 0 %
LYMPHS ABS: 2.6 10*3/uL (ref 0.7–3.1)
LYMPHS: 30 %
MCH: 31.8 pg (ref 26.6–33.0)
MCHC: 34.3 g/dL (ref 31.5–35.7)
MCV: 93 fL (ref 79–97)
MONOS ABS: 0.5 10*3/uL (ref 0.1–0.9)
Monocytes: 6 %
NEUTROS ABS: 5.2 10*3/uL (ref 1.4–7.0)
Neutrophils: 62 %
Platelets: 200 10*3/uL (ref 150–379)
RBC: 3.87 x10E6/uL (ref 3.77–5.28)
RDW: 14 % (ref 12.3–15.4)
RH TYPE: POSITIVE
RPR Ser Ql: NONREACTIVE
Rubella Antibodies, IGG: 0.93 index — ABNORMAL LOW (ref >0.99)
WBC: 8.5 10*3/uL (ref 3.4–10.8)

## 2017-04-05 LAB — URINALYSIS, ROUTINE W REFLEX MICROSCOPIC
Bilirubin, UA: NEGATIVE
Glucose, UA: NEGATIVE
Ketones, UA: NEGATIVE
LEUKOCYTES UA: NEGATIVE
Nitrite, UA: NEGATIVE
PH UA: 7.5 (ref 5.0–7.5)
Protein, UA: NEGATIVE
RBC UA: NEGATIVE
Specific Gravity, UA: 1.016 (ref 1.005–1.030)
Urobilinogen, Ur: 0.2 mg/dL (ref 0.2–1.0)

## 2017-04-05 LAB — MED LIST OPTION NOT SELECTED

## 2017-04-06 LAB — URINE CULTURE: Organism ID, Bacteria: NO GROWTH

## 2017-04-07 LAB — PMP SCREEN PROFILE (10S), URINE
AMPHETAMINE SCREEN URINE: NEGATIVE ng/mL
BARBITURATE SCREEN URINE: NEGATIVE ng/mL
BENZODIAZEPINE SCREEN, URINE: NEGATIVE ng/mL
CANNABINOIDS UR QL SCN: POSITIVE ng/mL — AB
Cocaine (Metab) Scrn, Ur: NEGATIVE ng/mL
Creatinine(Crt), U: 57.3 mg/dL (ref 20.0–300.0)
Methadone Screen, Urine: NEGATIVE ng/mL
OXYCODONE+OXYMORPHONE UR QL SCN: POSITIVE ng/mL — AB
Opiate Scrn, Ur: NEGATIVE ng/mL
PHENCYCLIDINE QUANTITATIVE URINE: NEGATIVE ng/mL
Ph of Urine: 7 (ref 4.5–8.9)
Propoxyphene Scrn, Ur: NEGATIVE ng/mL

## 2017-04-08 ENCOUNTER — Encounter: Payer: Self-pay | Admitting: Women's Health

## 2017-04-08 DIAGNOSIS — O285 Abnormal chromosomal and genetic finding on antenatal screening of mother: Secondary | ICD-10-CM | POA: Insufficient documentation

## 2017-04-08 DIAGNOSIS — R87619 Unspecified abnormal cytological findings in specimens from cervix uteri: Secondary | ICD-10-CM | POA: Insufficient documentation

## 2017-04-08 LAB — INTEGRATED 2
AFP MARKER: 25.9 ng/mL
AFP MoM: 0.81
Crown Rump Length: 77.4 mm
DIA MoM: 1.97
DIA VALUE: 348.9 pg/mL
Estriol, Unconjugated: 0.59 ng/mL
Gest. Age on Collection Date: 13.6 weeks
Gestational Age: 16.1 weeks
HCG MOM: 1.27
HCG VALUE: 43.8 [IU]/mL
MATERNAL AGE AT EDD: 30.7 a
NUCHAL TRANSLUCENCY (NT): 2.8 mm
Nuchal Translucency MoM: 1.48
Number of Fetuses: 1
PAPP-A MOM: 0.6
PAPP-A VALUE: 932.9 ng/mL
Test Results:: POSITIVE — AB
WEIGHT: 141 [lb_av]
WEIGHT: 141 [lb_av]
uE3 MoM: 0.71

## 2017-04-08 LAB — CYTOLOGY - PAP
Chlamydia: NEGATIVE
Diagnosis: UNDETERMINED — AB
HPV (WINDOPATH): NOT DETECTED
NEISSERIA GONORRHEA: NEGATIVE

## 2017-04-10 ENCOUNTER — Encounter: Payer: Self-pay | Admitting: Women's Health

## 2017-04-10 ENCOUNTER — Telehealth: Payer: Self-pay | Admitting: Women's Health

## 2017-04-10 DIAGNOSIS — O9989 Other specified diseases and conditions complicating pregnancy, childbirth and the puerperium: Secondary | ICD-10-CM

## 2017-04-10 DIAGNOSIS — O09899 Supervision of other high risk pregnancies, unspecified trimester: Secondary | ICD-10-CM | POA: Insufficient documentation

## 2017-04-10 DIAGNOSIS — Z283 Underimmunization status: Secondary | ICD-10-CM | POA: Insufficient documentation

## 2017-04-10 NOTE — Telephone Encounter (Signed)
LM for pt to return call. Need to notify of increased r/f DS on nt/it.  Cheral MarkerKimberly R. Lexington Devine, CNM, [facility]NP-BC 04/10/2017 10:52 AM

## 2017-04-11 ENCOUNTER — Telehealth: Payer: Self-pay | Admitting: Women's Health

## 2017-04-11 DIAGNOSIS — O285 Abnormal chromosomal and genetic finding on antenatal screening of mother: Secondary | ICD-10-CM

## 2017-04-11 NOTE — Telephone Encounter (Signed)
Called pt, notified her of ASCUS w/ -HRHPV pap, DSR 1:130 on nt/it, offered cfDNA and referral to MFM/genetic counselor. Pt wants to do cfDNA first, if comes back abnormal then MFM/genetic counselor referral.  Informaseq order placed Cheral MarkerKimberly R. Bronnie Vasseur, CNM, WHNP-BC 04/11/2017 1:55 PM

## 2017-04-15 LAB — CYSTIC FIBROSIS MUTATION 97: GENE DIS ANAL CARRIER INTERP BLD/T-IMP: NOT DETECTED

## 2017-04-21 ENCOUNTER — Ambulatory Visit (INDEPENDENT_AMBULATORY_CARE_PROVIDER_SITE_OTHER): Payer: Medicaid Other | Admitting: Women's Health

## 2017-04-21 ENCOUNTER — Ambulatory Visit: Payer: Self-pay

## 2017-04-21 ENCOUNTER — Encounter: Payer: Self-pay | Admitting: Women's Health

## 2017-04-21 VITALS — BP 100/60 | HR 83 | Wt 150.0 lb

## 2017-04-21 DIAGNOSIS — Z331 Pregnant state, incidental: Secondary | ICD-10-CM

## 2017-04-21 DIAGNOSIS — F1120 Opioid dependence, uncomplicated: Secondary | ICD-10-CM

## 2017-04-21 DIAGNOSIS — Z3482 Encounter for supervision of other normal pregnancy, second trimester: Secondary | ICD-10-CM

## 2017-04-21 DIAGNOSIS — O9989 Other specified diseases and conditions complicating pregnancy, childbirth and the puerperium: Secondary | ICD-10-CM

## 2017-04-21 DIAGNOSIS — O99322 Drug use complicating pregnancy, second trimester: Secondary | ICD-10-CM

## 2017-04-21 DIAGNOSIS — Z3A18 18 weeks gestation of pregnancy: Secondary | ICD-10-CM

## 2017-04-21 DIAGNOSIS — R102 Pelvic and perineal pain: Secondary | ICD-10-CM

## 2017-04-21 DIAGNOSIS — O285 Abnormal chromosomal and genetic finding on antenatal screening of mother: Secondary | ICD-10-CM

## 2017-04-21 DIAGNOSIS — Z1389 Encounter for screening for other disorder: Secondary | ICD-10-CM

## 2017-04-21 LAB — POCT URINALYSIS DIPSTICK
Blood, UA: NEGATIVE
GLUCOSE UA: NEGATIVE
Ketones, UA: NEGATIVE
LEUKOCYTES UA: NEGATIVE
Nitrite, UA: NEGATIVE
Protein, UA: NEGATIVE

## 2017-04-21 NOTE — Patient Instructions (Addendum)
Ashley Levy, I greatly value your feedback.  If you receive a survey following your visit with [place] today, we appreciate you taking the time to fill it out.  Thanks, Joellyn Haff, CNM, WHNP-BC  [facility] [employer] (call as soon as your medicaid is active) [phone]   Second Trimester of Pregnancy The second trimester is from week 14 through week 27 (months 4 through 6). The second trimester is often a time when you feel your best. Your body has adjusted to being pregnant, and you begin to feel better physically. Usually, morning sickness has lessened or quit completely, you may have more energy, and you may have an increase in appetite. The second trimester is also a time when the fetus is growing rapidly. At the end of the sixth month, the fetus is about 9 inches long and weighs about 1 pounds. You will likely begin to feel the baby move (quickening) between 16 and 20 weeks of pregnancy. Body changes during your second trimester Your body continues to go through many changes during your second trimester. The changes vary from woman to woman.  Your weight will continue to increase. You will notice your lower abdomen bulging out.  You may begin to get stretch marks on your hips, abdomen, and breasts.  You may develop headaches that can be relieved by medicines. The medicines should be approved by your health care provider.  You may urinate more often because the fetus is pressing on your bladder.  You may develop or continue to have heartburn as a result of your pregnancy.  You may develop constipation because certain hormones are causing the muscles that push waste through your intestines to slow down.  You may develop hemorrhoids or swollen, bulging veins (varicose veins).  You may have back pain. This is caused by: ? Weight gain. ? Pregnancy hormones that are relaxing the joints in your pelvis. ? A shift in weight and the muscles that support your balance.  Your breasts will  continue to grow and they will continue to become tender.  Your gums may bleed and may be sensitive to brushing and flossing.  Dark spots or blotches (chloasma, mask of pregnancy) may develop on your face. This will likely fade after the baby is born.  A dark line from your belly button to the pubic area (linea nigra) may appear. This will likely fade after the baby is born.  You may have changes in your hair. These can include thickening of your hair, rapid growth, and changes in texture. Some women also have hair loss during or after pregnancy, or hair that feels dry or thin. Your hair will most likely return to normal after your baby is born.  What to expect at prenatal visits During a routine prenatal visit:  You will be weighed to make sure you and the fetus are growing normally.  Your blood pressure will be taken.  Your abdomen will be measured to track your baby's growth.  The fetal heartbeat will be listened to.  Any test results from the previous visit will be discussed.  Your health care provider may ask you:  How you are feeling.  If you are feeling the baby move.  If you have had any abnormal symptoms, such as leaking fluid, bleeding, severe headaches, or abdominal cramping.  If you are using any tobacco products, including cigarettes, chewing tobacco, and electronic cigarettes.  If you have any questions.  Other tests that may be performed during your second trimester include:  Blood tests that check for: ? Low iron levels (anemia). ? High blood sugar that affects pregnant women (gestational diabetes) between 5424 and 28 weeks. ? Rh antibodies. This is to check for a protein on red blood cells (Rh factor).  Urine tests to check for infections, diabetes, or protein in the urine.  An ultrasound to confirm the proper growth and development of the baby.  An amniocentesis to check for possible genetic problems.  Fetal screens for spina bifida and Down  syndrome.  HIV (human immunodeficiency virus) testing. Routine prenatal testing includes screening for HIV, unless you choose not to have this test.  Follow these instructions at home: Medicines  Follow your health care provider's instructions regarding medicine use. Specific medicines may be either safe or unsafe to take during pregnancy.  Take a prenatal vitamin that contains at least 600 micrograms (mcg) of folic acid.  If you develop constipation, try taking a stool softener if your health care provider approves. Eating and drinking  Eat a balanced diet that includes fresh fruits and vegetables, whole grains, good sources of protein such as meat, eggs, or tofu, and low-fat dairy. Your health care provider will help you determine the amount of weight gain that is right for you.  Avoid raw meat and uncooked cheese. These carry germs that can cause birth defects in the baby.  If you have low calcium intake from food, talk to your health care provider about whether you should take a daily calcium supplement.  Limit foods that are high in fat and processed sugars, such as fried and sweet foods.  To prevent constipation: ? Drink enough fluid to keep your urine clear or pale yellow. ? Eat foods that are high in fiber, such as fresh fruits and vegetables, whole grains, and beans. Activity  Exercise only as directed by your health care provider. Most women can continue their usual exercise routine during pregnancy. Try to exercise for 30 minutes at least 5 days a week. Stop exercising if you experience uterine contractions.  Avoid heavy lifting, wear low heel shoes, and practice good posture.  A sexual relationship may be continued unless your health care provider directs you otherwise. Relieving pain and discomfort  Wear a good support bra to prevent discomfort from breast tenderness.  Take warm sitz baths to soothe any pain or discomfort caused by hemorrhoids. Use hemorrhoid cream if  your health care provider approves.  Rest with your legs elevated if you have leg cramps or low back pain.  If you develop varicose veins, wear support hose. Elevate your feet for 15 minutes, 3-4 times a day. Limit salt in your diet. Prenatal Care  Write down your questions. Take them to your prenatal visits.  Keep all your prenatal visits as told by your health care provider. This is important. Safety  Wear your seat belt at all times when driving.  Make a list of emergency phone numbers, including numbers for family, friends, the hospital, and police and fire departments. General instructions  Ask your health care provider for a referral to a local prenatal education class. Begin classes no later than the beginning of month 6 of your pregnancy.  Ask for help if you have counseling or nutritional needs during pregnancy. Your health care provider can offer advice or refer you to specialists for help with various needs.  Do not use hot tubs, steam rooms, or saunas.  Do not douche or use tampons or scented sanitary pads.  Do not cross your legs  for long periods of time.  Avoid cat litter boxes and soil used by cats. These carry germs that can cause birth defects in the baby and possibly loss of the fetus by miscarriage or stillbirth.  Avoid all smoking, herbs, alcohol, and unprescribed drugs. Chemicals in these products can affect the formation and growth of the baby.  Do not use any products that contain nicotine or tobacco, such as cigarettes and e-cigarettes. If you need help quitting, ask your health care provider.  Visit your dentist if you have not gone yet during your pregnancy. Use a soft toothbrush to brush your teeth and be gentle when you floss. Contact a health care provider if:  You have dizziness.  You have mild pelvic cramps, pelvic pressure, or nagging pain in the abdominal area.  You have persistent nausea, vomiting, or diarrhea.  You have a bad smelling  vaginal discharge.  You have pain when you urinate. Get help right away if:  You have a fever.  You are leaking fluid from your vagina.  You have spotting or bleeding from your vagina.  You have severe abdominal cramping or pain.  You have rapid weight gain or weight loss.  You have shortness of breath with chest pain.  You notice sudden or extreme swelling of your face, hands, ankles, feet, or legs.  You have not felt your baby move in over an hour.  You have severe headaches that do not go away when you take medicine.  You have vision changes. Summary  The second trimester is from week 14 through week 27 (months 4 through 6). It is also a time when the fetus is growing rapidly.  Your body goes through many changes during pregnancy. The changes vary from woman to woman.  Avoid all smoking, herbs, alcohol, and unprescribed drugs. These chemicals affect the formation and growth your baby.  Do not use any tobacco products, such as cigarettes, chewing tobacco, and e-cigarettes. If you need help quitting, ask your health care provider.  Contact your health care provider if you have any questions. Keep all prenatal visits as told by your health care provider. This is important. This information is not intended to replace advice given to you by your health care provider. Make sure you discuss any questions you have with your health care provider. Document Released: 01/22/2001 Document Revised: 07/06/2015 Document Reviewed: 03/31/2012 Elsevier Interactive Patient Education  2017 [employer]

## 2017-04-21 NOTE — Progress Notes (Signed)
LOW-RISK PREGNANCY VISIT Patient name: Ashley Levy MRN [id number]  Date of birth: 01/29/1986 Chief Complaint:   High Risk Gestation (stomach pain off and on)  History of Present Illness:   Ashley Levy is a 32 y.o. G35P1001 female at [redacted]w[redacted]d with an Estimated Date of Delivery: 09/16/17 being seen today for ongoing management of a low-risk pregnancy.  Today she reports intermittent abd cramping. NT/IT positive for T21 1:130, offered Informaseq which she wanted, but hasn't had drawn yet. States she wants to do today  . Is trying to decrease her pain pills, isn't very clear about how many she takes per day, but reports she is trying to cut in 1/4's and get down to taking 1 total pill daily. [facility]aid is still pending, so hasn't been able to get in w/ [facility] yet.   Vag. Bleeding: None.  Movement: Absent. denies leaking of fluid. Review of Systems:   Pertinent items are noted in HPI Denies abnormal vaginal discharge w/ itching/odor/irritation, headaches, visual changes, shortness of breath, chest pain, abdominal pain, severe nausea/vomiting, or problems with urination or bowel movements unless otherwise stated above. Pertinent History Reviewed:  Reviewed past medical,surgical, social, obstetrical and family history.  Reviewed problem list, medications and allergies. Physical Assessment:   Vitals:   04/21/17 1037  BP: 100/60  Pulse: 83  Weight: 150 lb (68 kg)  Body mass index is 28.34 kg/m.        Physical Examination:   General appearance: Well appearing, and in no distress  Mental status: Alert, oriented to person, place, and time  Skin: Warm & dry  Cardiovascular: Normal heart rate noted  Respiratory: Normal respiratory effort, no distress  Abdomen: Soft, gravid, nontender  Pelvic: Cervical exam deferred         Extremities: Edema: Trace  Fetal Status: Fetal Heart Rate (bpm): 154   Movement: Absent    Results for orders placed or performed in visit on 04/21/17 (from the  past 24 hour(s))  POCT urinalysis dipstick   Collection Time: 04/21/17 10:41 AM  Result Value Ref Range   Color, UA     Clarity, UA     Glucose, UA neg    Bilirubin, UA     Ketones, UA neg    Spec Grav, UA  1.010 - 1.025   Blood, UA neg    pH, UA  5.0 - 8.0   Protein, UA neg    Urobilinogen, UA  0.2 or 1.0 E.U./dL   Nitrite, UA neg    Leukocytes, UA Negative Negative   Appearance     Odor      Assessment & Plan:  1) Low-risk pregnancy G2P1001 at [redacted]w[redacted]d with an Estimated Date of Delivery: 09/16/17   2) Opiate addicition, trying to cut back on her own, discussed cutting back slowly to avoid w/drawals, seizures, etc. Call [facility] for appt asap as soon as Mcaid active. Discussed all w/ [facility] who agrees  3) Increased r/f T21, 1:130, wants Informaseq today   Meds: No orders of the defined types were placed in this encounter.  Labs/procedures today: was late for anatomy u/s, will reschedule. Informaseq  Plan:  Continue routine obstetrical care   Reviewed: Preterm labor symptoms and general obstetric precautions including but not limited to vaginal bleeding, contractions, leaking of fluid and fetal movement were reviewed in detail with the patient.  All questions were answered  Follow-up: Return for asap for anatomy, then 2wks for LROB .  Orders Placed This Encounter  Procedures  . informaSeq(SM) with XY Analysis  . POCT urinalysis dipstick   Cheral MarkerKimberly R Booker CNM, [facility]NP-BC 04/21/2017 11:24 AM

## 2017-04-24 ENCOUNTER — Ambulatory Visit (INDEPENDENT_AMBULATORY_CARE_PROVIDER_SITE_OTHER): Payer: Medicaid Other

## 2017-04-24 DIAGNOSIS — Z363 Encounter for antenatal screening for malformations: Secondary | ICD-10-CM | POA: Diagnosis not present

## 2017-04-24 DIAGNOSIS — Z3402 Encounter for supervision of normal first pregnancy, second trimester: Secondary | ICD-10-CM

## 2017-04-24 NOTE — Progress Notes (Signed)
US 19+2 wks,cephalic,cx 4.8 cm,posterior pl gr 0,normal ovaries,svp 5 cm,fhr 152 bpm,efw 263 g 24%,anatomy complete,no obvious abnormalities

## 2017-04-26 LAB — INFORMASEQ(SM) WITH XY ANALYSIS
FETAL FRACTION (%): 11.6
FETAL NUMBER: 1
GESTATIONAL AGE AT COLLECTION: 18.6 wk
Weight: 150 [lb_av]

## 2017-04-28 ENCOUNTER — Telehealth: Payer: Self-pay | Admitting: *Deleted

## 2017-04-28 NOTE — Telephone Encounter (Signed)
LMOVM that genetic testing was negative for down syndrome, including trisomy 18 and 13.

## 2017-05-05 ENCOUNTER — Encounter: Payer: Self-pay | Admitting: Women's Health

## 2017-06-21 ENCOUNTER — Emergency Department (HOSPITAL_COMMUNITY)
Admission: EM | Admit: 2017-06-21 | Discharge: 2017-06-21 | Disposition: A | Discharge status: Home / Self Care | Payer: Medicaid Other | Attending: Emergency Medicine | Admitting: Emergency Medicine

## 2017-06-21 ENCOUNTER — Encounter (HOSPITAL_COMMUNITY): Payer: Self-pay | Admitting: Cardiology

## 2017-06-21 DIAGNOSIS — R05 Cough: Secondary | ICD-10-CM | POA: Diagnosis not present

## 2017-06-21 DIAGNOSIS — F1290 Cannabis use, unspecified, uncomplicated: Secondary | ICD-10-CM | POA: Insufficient documentation

## 2017-06-21 DIAGNOSIS — Z79899 Other long term (current) drug therapy: Secondary | ICD-10-CM | POA: Diagnosis not present

## 2017-06-21 DIAGNOSIS — F17210 Nicotine dependence, cigarettes, uncomplicated: Secondary | ICD-10-CM | POA: Insufficient documentation

## 2017-06-21 DIAGNOSIS — O99323 Drug use complicating pregnancy, third trimester: Secondary | ICD-10-CM | POA: Diagnosis not present

## 2017-06-21 DIAGNOSIS — J069 Acute upper respiratory infection, unspecified: Secondary | ICD-10-CM | POA: Insufficient documentation

## 2017-06-21 DIAGNOSIS — O9952 Diseases of the respiratory system complicating childbirth: Secondary | ICD-10-CM | POA: Diagnosis present

## 2017-06-21 DIAGNOSIS — Z3A28 28 weeks gestation of pregnancy: Secondary | ICD-10-CM | POA: Insufficient documentation

## 2017-06-21 DIAGNOSIS — O99333 Smoking (tobacco) complicating pregnancy, third trimester: Secondary | ICD-10-CM | POA: Insufficient documentation

## 2017-06-21 DIAGNOSIS — O99513 Diseases of the respiratory system complicating pregnancy, third trimester: Secondary | ICD-10-CM | POA: Insufficient documentation

## 2017-06-21 DIAGNOSIS — B9789 Other viral agents as the cause of diseases classified elsewhere: Secondary | ICD-10-CM

## 2017-06-21 NOTE — ED Triage Notes (Addendum)
Nasal congestion and cough since yesterday.  Pt states she coughs till she vomits and has noticed blood in her emesis.  Pt is pregnant.  Due in August.

## 2017-06-21 NOTE — ED Provider Notes (Signed)
[facility] EMERGENCY DEPARTMENT Provider Note   CSN: [id number] Arrival date & time: 06/21/17  1021     History   Chief Complaint No chief complaint on file.   HPI Ashley Levy is a 32 y.o. female.  Pt is pregnant and due Aug 6.  The history is provided by the patient.  URI   This is a new problem. The current episode started 2 days ago. There has been no fever. Associated symptoms include congestion, rhinorrhea and cough. Pertinent negatives include no chest pain, no abdominal pain, no diarrhea, no vomiting, no dysuria, no neck pain, no rash and no wheezing. She has tried nothing for the symptoms.    Past Medical History:  Diagnosis Date  . Contraceptive management 08/02/2013  . Encounter for Nexplanon removal 08/02/2013  . HSV-2 infection   . Polycystic ovarian syndrome   . Postpartum depression 07/07/2014  . Pregnant and not yet delivered     Patient Active Problem List   Diagnosis Date Noted  . Rubella non-immune status, antepartum 04/10/2017  . Abnormal chromosomal and genetic finding on antenatal screening mother 04/08/2017  . Abnormal Pap smear of cervix 04/08/2017  . Supervision of normal pregnancy 04/04/2017  . Marijuana use 04/04/2017  . Opiate addiction (HCC) 04/04/2017  . Postpartum depression 07/07/2014  . Smoker 11/01/2013  . 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 NO PAST SURGERIES    . OPEN REDUCTION INTERNAL FIXATION (ORIF) PROXIMAL PHALANX Left 06/17/2015   Procedure: OPEN REDUCTION INTERNAL FIXATION (ORIF) LEFT SMALL FINGER PROXIMAL PHALANX FRACTURE;  Surgeon: Dominica Severin, MD;  Location: [facility] OR;  Service: Orthopedics;  Laterality: Left;     OB History    Gravida  2   Para  1   Term  1   Preterm      AB      Living  1     SAB      TAB      Ectopic      Multiple  0   Live Births  1            Home Medications    Prior to Admission medications     Medication Sig Start Date End Date Taking? Authorizing Provider  acetaminophen (TYLENOL) 325 MG tablet Take 650 mg by mouth every 6 (six) hours as needed.    [provider]  Pediatric Multivit-Minerals-C (FLINTSTONES COMPLETE PO) Take 2 tablets by mouth daily.    [provider]  sertraline (ZOLOFT) 25 MG tablet Take 1 tablet (25 mg total) by mouth daily. 04/04/17   Cheral Marker, CNM    Family History Family History  Problem Relation Age of Onset  . Asthma Mother   . Cancer Paternal Grandfather   . Psoriasis Maternal Grandmother   . Asthma Maternal Grandfather   . Heart disease Father   . Heart attack Father     Social History Social History   Tobacco Use  . Smoking status: Current Every Day Smoker    Packs/day: 1.00    Years: 6.00    Pack years: 6.00    Types: Cigarettes  . Smokeless tobacco: Never Used  Substance Use Topics  . Alcohol use: No    Frequency: Never    Comment: occasionally  . Drug use: Yes    Types: Marijuana    Comment: helps with nausea; "every now and then"     Allergies   Patient  has no known allergies.   Review of Systems Review of Systems  Constitutional: Positive for fatigue. Negative for activity change.       All ROS Neg except as noted in HPI  HENT: Positive for congestion and rhinorrhea. Negative for nosebleeds.   Eyes: Negative for photophobia and discharge.  Respiratory: Positive for cough. Negative for shortness of breath and wheezing.   Cardiovascular: Negative for chest pain and palpitations.  Gastrointestinal: Negative for abdominal pain, blood in stool, diarrhea and vomiting.  Genitourinary: Negative for dysuria, frequency and hematuria.  Musculoskeletal: Negative for arthralgias, back pain and neck pain.  Skin: Negative.  Negative for rash.  Neurological: Negative for dizziness, seizures and speech difficulty.  Psychiatric/Behavioral: Negative for confusion and hallucinations.     Physical  Exam Updated Vital Signs BP 102/69   Pulse 95   Temp 98.5 F (36.9 C) (Oral)   Resp 18   Ht 5' 1.5" (1.562 m)   Wt 69.4 kg (153 lb)   LMP  (LMP Unknown)   SpO2 99%   BMI 28.44 kg/m   Physical Exam  Constitutional: She is oriented to person, place, and time. She appears well-developed and well-nourished.  Non-toxic appearance.  HENT:  Head: Normocephalic.  Right Ear: Tympanic membrane and external ear normal.  Left Ear: Tympanic membrane and external ear normal.  Nasal congestion  Uvula midline. Airway patent.  Eyes: Pupils are equal, round, and reactive to light. EOM and lids are normal.  Neck: Normal range of motion. Neck supple. Carotid bruit is not present.  Cardiovascular: Normal rate, regular rhythm, normal heart sounds, intact distal pulses and normal pulses.  Pulmonary/Chest: Effort normal and breath sounds normal. No stridor. No respiratory distress. She has no wheezes.  Abdominal: Soft. Bowel sounds are normal. There is no tenderness. There is no guarding.  Musculoskeletal: Normal range of motion. She exhibits no edema.  Neg Homan's sign. No edema.  Lymphadenopathy:       Head (right side): No submandibular adenopathy present.       Head (left side): No submandibular adenopathy present.    She has no cervical adenopathy.  Neurological: She is alert and oriented to person, place, and time. She has normal strength. No cranial nerve deficit or sensory deficit.  Skin: Skin is warm and dry.  Psychiatric: She has a normal mood and affect. Her speech is normal.  Nursing note and vitals reviewed.    ED Treatments / Results  Labs (all labs ordered are listed, but only abnormal results are displayed) Labs Reviewed - No data to display  EKG None  Radiology No results found.  Procedures Procedures (including critical care time)  Medications Ordered in ED Medications - No data to display   Initial Impression / Assessment and Plan / ED Course  I have reviewed the  triage vital signs and the nursing notes.  Pertinent labs & imaging results that were available during my care of the patient were reviewed by me and considered in my medical decision making (see chart for details).       Final Clinical Impressions(s) / ED Diagnoses MDM Vital signs within normal limits.  Pulse oximetry is 99% on room air.MDM Within normal limits by my interpretation. Fetal heart tones 151.   The examination favors an upper respiratory infection with nasal congestion or sinusitis.  The patient speaks in complete sentences without problem.  There is mild cough present, but no other acute changes noted.  I have asked the patient to increase fluids.  To discuss her symptoms with her OB/GYN physician, and to use Afrin nasal spray every 8 hours until she can discuss this with the primary physician.  Patient is in agreement with the plan.   Final diagnoses:  Viral URI with cough    ED Discharge Orders    None       Ivery Quale, PA-C 06/21/17 1452    Eber Hong, MD 06/22/17 [phone]

## 2017-06-21 NOTE — Discharge Instructions (Addendum)
Your vital signs are within normal limits.  Your oxygen level is 99% on room air.  Within normal limits by my interpretation.  Your fetal heart tones are 151.  Your examination favors an upper respiratory infection with some sinus congestion/sinusitis.  Please increase your fluids.  Use Afrin every 8 hours for 4 days only.  You may rest for 4 days and then may resume if needed after the 4-day period.  Please discuss your symptoms with your OB/GYN for coordination of care with medications used during your pregnancy.  Please wash her hands frequently.  Have the entire household wash hands frequently.  May use Tylenol every 4 hours for any body aching, and/or fever.

## 2017-06-26 ENCOUNTER — Telehealth: Payer: Self-pay | Admitting: *Deleted

## 2017-06-26 NOTE — Telephone Encounter (Signed)
Patient states she is having dizziness especially when bending over.  Informed patient that being pregnant or not, with bending dizziness can occur due to shifts in fluid.  Patient noted to sound like she was sick.  Patient states she went to the ER and was found to have a viral infection.  Informed that as well could cause her to have changes in her equilibrium and to push fluids.  Advised it just had to run its course and could get worse before getting better.  Encouraged to push fluids and treat symptoms.

## 2017-07-18 ENCOUNTER — Encounter: Payer: Self-pay | Admitting: Obstetrics and Gynecology

## 2017-07-18 ENCOUNTER — Ambulatory Visit (INDEPENDENT_AMBULATORY_CARE_PROVIDER_SITE_OTHER): Payer: Medicaid Other | Admitting: Obstetrics and Gynecology

## 2017-07-18 VITALS — BP 94/58 | HR 73 | Wt 156.8 lb

## 2017-07-18 DIAGNOSIS — Z3403 Encounter for supervision of normal first pregnancy, third trimester: Secondary | ICD-10-CM

## 2017-07-18 DIAGNOSIS — O99323 Drug use complicating pregnancy, third trimester: Secondary | ICD-10-CM

## 2017-07-18 DIAGNOSIS — Z331 Pregnant state, incidental: Secondary | ICD-10-CM

## 2017-07-18 DIAGNOSIS — F1123 Opioid dependence with withdrawal: Secondary | ICD-10-CM

## 2017-07-18 DIAGNOSIS — Z3A31 31 weeks gestation of pregnancy: Secondary | ICD-10-CM

## 2017-07-18 DIAGNOSIS — Z1389 Encounter for screening for other disorder: Secondary | ICD-10-CM

## 2017-07-18 LAB — POCT URINALYSIS DIPSTICK
Blood, UA: NEGATIVE
Glucose, UA: NEGATIVE
Ketones, UA: NEGATIVE
Leukocytes, UA: NEGATIVE
NITRITE UA: NEGATIVE
PROTEIN UA: NEGATIVE

## 2017-07-18 NOTE — Progress Notes (Addendum)
Patient ID: Ashley Levy Pfeffer, female   DOB: 1985-10-24, 32 y.o.   MRN: [id number]    LOW-RISK PREGNANCY VISIT Patient name: Ashley Levy Omdahl MRN [id number]  Date of birth: 1985-10-24 Chief Complaint:   low risk ob  History of Present Illness:   Ashley Levy Knisley is a 32 y.o. 722P1001 female at 7158w3d with an Estimated Date of Delivery: 09/16/17 being seen today for ongoing management of a low-risk pregnancy. Baird LyonsCasey is accompanied by her current older boyfriend's adult child, who is approximately the same age as Ms Christell ConstantMoore Today she reports no overall complaints.. She recently had her medicaid approved, only recently within 2 weeks did she apply for this. She has not been taking her depression medication for postpartum depression after her first pregnancy. Baird LyonsCasey has one child with previous partner and the father is not involved. She has expressed interest to get off opioids and has cut back to about 5 pills a days. She is unsure exactly what she is taking and reports she will take whatever she can get her hands on. She will take half of a pill in the morning and half in the afternoon. She also smokes marijuana, and denies any other drug use. Stating the last time she used cocaine or heroine was over a year ago. She is unable to be admitted to rehab since she works from home and has to take care of her daughter. She didn't breastfeed with first child  .  Marland Kitchen.  Movement: Present. denies leaking of fluid. She would like to have tubal ligation as a form of birthcontrol after delivery and tubal papers were signed. Review of Systems:   Pertinent items are noted in HPI Denies abnormal vaginal discharge w/ itching/odor/irritation, headaches, visual changes, shortness of breath, chest pain, abdominal pain, severe nausea/vomiting, or problems with urination or bowel movements unless otherwise stated above. Pertinent History Reviewed:  Reviewed past medical,surgical, social, obstetrical and family history.  Reviewed problem list,  medications and allergies. Physical Assessment:   Vitals:   07/18/17 1051  BP: (!) 94/58  Pulse: 73  Weight: 156 lb 12.8 oz (71.1 kg)  Body mass index is 29.15 kg/m.        Physical Examination:   General appearance: Well appearing, and in no distress  Mental status: Alert, oriented to person, place, and time  Skin: Warm & dry  Cardiovascular: Normal heart rate noted  Respiratory: Normal respiratory effort, no distress  Abdomen: Soft, gravid, nontender  Pelvic: Cervical exam deferred         Extremities: Edema: None  Fetal Status: Fetal Heart Rate (bpm): 139 Fundal Height: 32 cm Movement: Present    Results for orders placed or performed in visit on 07/18/17 (from the past 24 hour(s))  POCT urinalysis dipstick   Collection Time: 07/18/17 10:57 AM  Result Value Ref Range   Color, UA     Clarity, UA     Glucose, UA Negative Negative   Bilirubin, UA     Ketones, UA neg    Spec Grav, UA  1.010 - 1.025   Blood, UA neg    pH, UA  5.0 - 8.0   Protein, UA Negative Negative   Urobilinogen, UA  0.2 or 1.0 E.U./dL   Nitrite, UA neg    Leukocytes, UA Negative Negative   Appearance     Odor      Assessment & Plan:  1) Low-risk pregnancy G2P1001 at 8458w3d with an Estimated Date of Delivery: 09/16/17  2.  Opioid abuse, will refer  to Dr. Cathey Endow in [place] if possible   Meds: No orders of the defined types were placed in this encounter.  Labs/procedures today: none  Plan:  Continue routine obstetrical care  Follow-up: No follow-ups on file.  Orders Placed This Encounter  Procedures  . POCT urinalysis dipstick   By signing my name below, I, Arnette Norris, attest that this documentation has been prepared under the direction and in the presence of Tilda Burrow, MD. Electronically Signed: Arnette Norris Medical Scribe. 07/18/17. 11:43 AM.  I personally performed the services described in this documentation, which was SCRIBED in my presence. The recorded information has been  reviewed and considered accurate. It has been edited as necessary during review. Tilda Burrow, MD

## 2017-08-04 ENCOUNTER — Encounter: Payer: Medicaid Other | Admitting: Women's Health

## 2017-08-10 ENCOUNTER — Emergency Department (HOSPITAL_COMMUNITY)
Admission: EM | Admit: 2017-08-10 | Discharge: 2017-08-10 | Disposition: A | Discharge status: Home / Self Care | Payer: Medicaid Other | Attending: Emergency Medicine | Admitting: Emergency Medicine

## 2017-08-10 ENCOUNTER — Emergency Department (HOSPITAL_COMMUNITY): Payer: Medicaid Other

## 2017-08-10 ENCOUNTER — Encounter (HOSPITAL_COMMUNITY): Payer: Self-pay | Admitting: *Deleted

## 2017-08-10 DIAGNOSIS — O99333 Smoking (tobacco) complicating pregnancy, third trimester: Secondary | ICD-10-CM | POA: Diagnosis not present

## 2017-08-10 DIAGNOSIS — Z3A34 34 weeks gestation of pregnancy: Secondary | ICD-10-CM | POA: Diagnosis not present

## 2017-08-10 DIAGNOSIS — Z79899 Other long term (current) drug therapy: Secondary | ICD-10-CM | POA: Diagnosis not present

## 2017-08-10 DIAGNOSIS — O9989 Other specified diseases and conditions complicating pregnancy, childbirth and the puerperium: Secondary | ICD-10-CM | POA: Diagnosis not present

## 2017-08-10 DIAGNOSIS — F17210 Nicotine dependence, cigarettes, uncomplicated: Secondary | ICD-10-CM | POA: Diagnosis not present

## 2017-08-10 DIAGNOSIS — R079 Chest pain, unspecified: Secondary | ICD-10-CM

## 2017-08-10 LAB — COMPREHENSIVE METABOLIC PANEL
ALT: 20 U/L (ref 0–44)
ANION GAP: 6 (ref 5–15)
AST: 18 U/L (ref 15–41)
Albumin: 3 g/dL — ABNORMAL LOW (ref 3.5–5.0)
Alkaline Phosphatase: 121 U/L (ref 38–126)
BUN: 5 mg/dL — ABNORMAL LOW (ref 6–20)
CALCIUM: 8.3 mg/dL — AB (ref 8.9–10.3)
CHLORIDE: 106 mmol/L (ref 98–111)
CO2: 22 mmol/L (ref 22–32)
Creatinine, Ser: 0.49 mg/dL (ref 0.44–1.00)
Glucose, Bld: 97 mg/dL (ref 70–99)
Potassium: 3.6 mmol/L (ref 3.5–5.1)
SODIUM: 134 mmol/L — AB (ref 135–145)
Total Bilirubin: 0.4 mg/dL (ref 0.3–1.2)
Total Protein: 6.4 g/dL — ABNORMAL LOW (ref 6.5–8.1)

## 2017-08-10 LAB — CBC
HCT: 33.4 % — ABNORMAL LOW (ref 36.0–46.0)
HEMOGLOBIN: 11.1 g/dL — AB (ref 12.0–15.0)
MCH: 30.7 pg (ref 26.0–34.0)
MCHC: 33.2 g/dL (ref 30.0–36.0)
MCV: 92.5 fL (ref 78.0–100.0)
Platelets: 186 10*3/uL (ref 150–400)
RBC: 3.61 MIL/uL — AB (ref 3.87–5.11)
RDW: 13.2 % (ref 11.5–15.5)
WBC: 11 10*3/uL — AB (ref 4.0–10.5)

## 2017-08-10 LAB — PROTEIN / CREATININE RATIO, URINE
CREATININE, URINE: 82.2 mg/dL
Protein Creatinine Ratio: 0.12 mg/mg{Cre} (ref 0.00–0.15)
TOTAL PROTEIN, URINE: 10 mg/dL

## 2017-08-10 LAB — LACTATE DEHYDROGENASE: LDH: 93 U/L — AB (ref 98–192)

## 2017-08-10 NOTE — ED Triage Notes (Signed)
Pt with left upper abd pain for past couple of hours today, constant and sharp per pt, denies N/V. Pt's second pregnancy and does not believe it's contractions.

## 2017-08-10 NOTE — ED Notes (Signed)
Notified Kristi, RN OB Rapid Response that pt was here and placed on OB monitor.

## 2017-08-10 NOTE — ED Notes (Signed)
OB Rapid Response nurse called to clear pt from OB standpoint.

## 2017-08-10 NOTE — Progress Notes (Signed)
[facility] called about patient who presents to [facility] ED with complaints of left upper abdominal pain that came on abruptly and has lasted a couple of hours and is sharp in nature; patient denies contractions, bleeding or LOF; EFM applied by ED staffing; Dr Alysia PennaErvin notified of patient's arrival and complaint and new orders given for LDH, P:C ratio, CBC, CMP; all labs WNL for pregnant patient at this time; Dr Alysia PennaErvin made aware; orders given for OB clearance at this time; ED staff notified at this time

## 2017-08-11 ENCOUNTER — Telehealth: Payer: Self-pay | Admitting: Obstetrics & Gynecology

## 2017-08-11 ENCOUNTER — Encounter: Payer: Self-pay | Admitting: Women's Health

## 2017-08-11 ENCOUNTER — Ambulatory Visit (INDEPENDENT_AMBULATORY_CARE_PROVIDER_SITE_OTHER): Payer: Medicaid Other | Admitting: Women's Health

## 2017-08-11 VITALS — BP 102/58 | HR 80 | Wt 159.0 lb

## 2017-08-11 DIAGNOSIS — O0930 Supervision of pregnancy with insufficient antenatal care, unspecified trimester: Secondary | ICD-10-CM | POA: Insufficient documentation

## 2017-08-11 DIAGNOSIS — F1290 Cannabis use, unspecified, uncomplicated: Secondary | ICD-10-CM

## 2017-08-11 DIAGNOSIS — O99323 Drug use complicating pregnancy, third trimester: Secondary | ICD-10-CM

## 2017-08-11 DIAGNOSIS — Z3483 Encounter for supervision of other normal pregnancy, third trimester: Secondary | ICD-10-CM

## 2017-08-11 DIAGNOSIS — Z1389 Encounter for screening for other disorder: Secondary | ICD-10-CM

## 2017-08-11 DIAGNOSIS — Z23 Encounter for immunization: Secondary | ICD-10-CM

## 2017-08-11 DIAGNOSIS — R768 Other specified abnormal immunological findings in serum: Secondary | ICD-10-CM

## 2017-08-11 DIAGNOSIS — O0933 Supervision of pregnancy with insufficient antenatal care, third trimester: Secondary | ICD-10-CM

## 2017-08-11 DIAGNOSIS — Z3A34 34 weeks gestation of pregnancy: Secondary | ICD-10-CM

## 2017-08-11 DIAGNOSIS — Z331 Pregnant state, incidental: Secondary | ICD-10-CM

## 2017-08-11 LAB — POCT URINALYSIS DIPSTICK
Glucose, UA: NEGATIVE
Ketones, UA: NEGATIVE
Leukocytes, UA: NEGATIVE
Nitrite, UA: NEGATIVE
Protein, UA: NEGATIVE
RBC UA: NEGATIVE

## 2017-08-11 MED ORDER — ACYCLOVIR 400 MG PO TABS: 400.0000 mg | ORAL_TABLET | Freq: Three times a day (TID) | ORAL | 3 refills | Status: DC

## 2017-08-11 MED ORDER — PANTOPRAZOLE SODIUM 20 MG PO TBEC: 20.0000 mg | DELAYED_RELEASE_TABLET | Freq: Every day | ORAL | 6 refills | Status: DC

## 2017-08-11 NOTE — ED Provider Notes (Signed)
[facility] EMERGENCY DEPARTMENT Provider Note   CSN: [id number] Arrival date & time: 08/10/17  1948     History   Chief Complaint Chief Complaint  Patient presents with  . Abdominal Pain    HPI Ashley Levy is a 32 y.o. female.  HPI   32 year old female with left lower chest pain.  Pain is along the left costal margin.  Onset earlier today while in bed.  Denies any trauma.  Pain is constant.  Worse with certain movements and palpation.  No acute respiratory complaints.  She is [redacted] weeks pregnant.  No contractions.  No vaginal bleeding or or leakage of fluid.  Past Medical History:  Diagnosis Date  . Contraceptive management 08/02/2013  . Encounter for Nexplanon removal 08/02/2013  . History of postpartum depression 07/07/2014  . HSV-2 infection   . Polycystic ovarian syndrome   . Postpartum depression 07/07/2014  . Pregnant and not yet delivered     Patient Active Problem List   Diagnosis Date Noted  . Limited prenatal care 08/11/2017  . Rubella non-immune status, antepartum 04/10/2017  . Abnormal chromosomal and genetic finding on antenatal screening mother 04/08/2017  . Abnormal Pap smear of cervix 04/08/2017  . Supervision of normal pregnancy 04/04/2017  . Marijuana use 04/04/2017  . Opiate addiction (HCC) 04/04/2017  . History of postpartum depression 07/07/2014  . Smoker 11/01/2013  . 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 NO PAST SURGERIES    . OPEN REDUCTION INTERNAL FIXATION (ORIF) PROXIMAL PHALANX Left 06/17/2015   Procedure: OPEN REDUCTION INTERNAL FIXATION (ORIF) LEFT SMALL FINGER PROXIMAL PHALANX FRACTURE;  Surgeon: Dominica Severin, MD;  Location: [facility] OR;  Service: Orthopedics;  Laterality: Left;     OB History    Gravida  2   Para  1   Term  1   Preterm      AB      Living  1     SAB      TAB      Ectopic      Multiple  0   Live Births  1            Home  Medications    Prior to Admission medications   Medication Sig Start Date End Date Taking? Authorizing Provider  acetaminophen (TYLENOL) 325 MG tablet Take 650 mg by mouth every 6 (six) hours as needed.   Yes [provider]  Pediatric Multivit-Minerals-C (FLINTSTONES COMPLETE PO) Take 2 tablets by mouth daily.   Yes [provider]  ranitidine (ZANTAC) 75 MG tablet Take 75 mg by mouth as needed for heartburn.   Yes [provider]  sertraline (ZOLOFT) 25 MG tablet Take 1 tablet (25 mg total) by mouth daily. Patient not taking: Reported on 08/11/2017 04/04/17  Yes Cheral Marker, CNM  acyclovir (ZOVIRAX) 400 MG tablet Take 1 tablet (400 mg total) by mouth 3 (three) times daily. 08/11/17   Cheral Marker, CNM  pantoprazole (PROTONIX) 20 MG tablet Take 1 tablet (20 mg total) by mouth daily. 08/11/17   Cheral Marker, CNM    Family History Family History  Problem Relation Age of Onset  . Asthma Mother   . Cancer Paternal Grandfather   . Psoriasis Maternal Grandmother   . Asthma Maternal Grandfather   . Heart disease Father   . Heart attack Father     Social History Social History   Tobacco Use  .  Smoking status: Current Every Day Smoker    Packs/day: 1.00    Years: 6.00    Pack years: 6.00    Types: Cigarettes  . Smokeless tobacco: Never Used  Substance Use Topics  . Alcohol use: No    Frequency: Never    Comment: occasionally  . Drug use: Yes    Types: Marijuana    Comment: helps with nausea; "every now and then", last used a couple days ago per pt.     Allergies   Patient has no known allergies.   Review of Systems Review of Systems  All systems reviewed and negative, other than as noted in HPI.  Physical Exam Updated Vital Signs BP 103/64   Pulse 75   Temp 97.6 F (36.4 C) (Oral)   Resp 16   Ht 5\' 1"  (1.549 m)   Wt 71.2 kg (157 lb)   LMP  (LMP Unknown)   SpO2 100%   BMI 29.66 kg/m   Physical Exam  Constitutional: She  appears well-developed and well-nourished. No distress.  HENT:  Head: Normocephalic and atraumatic.  Eyes: Conjunctivae are normal. Right eye exhibits no discharge. Left eye exhibits no discharge.  Neck: Neck supple.  Cardiovascular: Normal rate, regular rhythm and normal heart sounds. Exam reveals no gallop and no friction rub.  No murmur heard. Pulmonary/Chest: Effort normal and breath sounds normal. No respiratory distress. She exhibits tenderness.  Tenderness to palpation along the left anterior costal margin.  No overlying skin changes.  Breath sounds clear and symmetric bilaterally.  Symmetric chest rise.  Abdominal: Soft. She exhibits no distension. There is no tenderness.  Gravid uterus with fundus well above the umbilicus.  Nontender.  Musculoskeletal: She exhibits no edema or tenderness.  Neurological: She is alert.  Skin: Skin is warm and dry.  Psychiatric: She has a normal mood and affect. Her behavior is normal. Thought content normal.  Nursing note and vitals reviewed.    ED Treatments / Results  Labs (all labs ordered are listed, but only abnormal results are displayed) Labs Reviewed  CBC - Abnormal; Notable for the following components:      Result Value   WBC 11.0 (*)    RBC 3.61 (*)    Hemoglobin 11.1 (*)    HCT 33.4 (*)    All other components within normal limits  LACTATE DEHYDROGENASE - Abnormal; Notable for the following components:   LDH 93 (*)    All other components within normal limits  COMPREHENSIVE METABOLIC PANEL - Abnormal; Notable for the following components:   Sodium 134 (*)    BUN 5 (*)    Calcium 8.3 (*)    Total Protein 6.4 (*)    Albumin 3.0 (*)    All other components within normal limits  PROTEIN / CREATININE RATIO, URINE    EKG EKG Interpretation  Date/Time:  Sunday August 10 2017 21:38:39 EDT Ventricular Rate:  69 PR Interval:    QRS Duration: 89 QT Interval:  376 QTC Calculation: 403 R Axis:   74 Text Interpretation:  Sinus  rhythm Baseline wander in lead(s) V3 Confirmed by Raeford Razor [phone]) on 08/10/2017 10:03:09 PM   Radiology Dg Chest 2 View  Result Date: 08/10/2017 CLINICAL DATA:  Left-sided chest pain for several hours EXAM: CHEST - 2 VIEW COMPARISON:  08/28/2015 FINDINGS: The heart size and mediastinal contours are within normal limits. Both lungs are clear. The visualized skeletal structures are unremarkable. IMPRESSION: No active cardiopulmonary disease. Electronically Signed   By: Loraine Leriche  Lukens M.D.   On: 08/10/2017 21:43    Procedures Procedures (including critical care time)  Medications Ordered in ED Medications - No data to display   Initial Impression / Assessment and Plan / ED Course  I have reviewed the triage vital signs and the nursing notes.  Pertinent labs & imaging results that were available during my care of the patient were reviewed by me and considered in my medical decision making (see chart for details).     32 year old female with pain along her left costal margin.  Very reproducible with palpation.  Probably with musculoskeletal chest wall pain.  I doubt ACS, PE, dissection or other emergent process.  [facility]  Final Clinical Impressions(s) / ED Diagnoses   Final diagnoses:  Left-sided chest pain    ED Discharge Orders    None       Raeford RazorKohut, Hoda Hon, MD 08/11/17 2338

## 2017-08-11 NOTE — Telephone Encounter (Signed)
Patient called stating that she has been having pain on her left side, by her ribs. Patient states that she has gone to the ER and they have done some scans and they have come up with nothing. Pt states that they asked her to call our office and see if she could come in earlier. Please contact ot

## 2017-08-11 NOTE — Progress Notes (Signed)
Work-in LOW-RISK PREGNANCY VISIT Patient name: Ashley Levy MRN [id number]  Date of birth: [birth year] Chief Complaint:   Routine Prenatal Visit (Pain in abdomen)  History of Present Illness:   Ashley Levy is a 32 y.o. G7P1001 female at [redacted]w[redacted]d with an Estimated Date of Delivery: 09/16/17 being seen today for ongoing management of a low-risk pregnancy.  Today she is being seen as a work-in for f/u from ED visit yesterday for LUQ pain. Had neg CXR, CBC, LDH, CMP, P:C ratio. No note from EDP in Epic. Reports pain as intermittent x few weeks, but constant stabbing pain started yesterday and is still present, but waxes and wanes. Did have some nausea when pain was really bad, no vomiting. No other sx. Does have h/o opiate addiction. Reports her 'old man' had hydrocodone 10 and she split it into 1/4's and took 1/4 last night and 1/4 again today. 'Hit the bowl' within last few days- reports this is weed. Denies any other illicit drug use. Limited pnc, no care from 18-31wks, states she was working. 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   Contractions: Not present. Vag. Bleeding: None.  Movement: Present. denies leaking of fluid. Review of Systems:   Pertinent items are noted in HPI Denies abnormal vaginal discharge w/ itching/odor/irritation, headaches, visual changes, shortness of breath, chest pain, abdominal pain, severe nausea/vomiting, or problems with urination or bowel movements unless otherwise stated above. Pertinent History Reviewed:  Reviewed past medical,surgical, social, obstetrical and family history.  Reviewed problem list, medications and allergies. Physical Assessment:   Vitals:   08/11/17 1458  BP: (!) 102/58  Pulse: 80  Weight: 159 lb (72.1 kg)  Body mass index is 30.04 kg/m.        Physical Examination:   General appearance: Well appearing, and in no distress  Mental status: Alert, oriented to person, place, and time  Skin: Warm & dry  Cardiovascular: Normal heart rate  noted  Respiratory: Normal respiratory effort, no distress  Abdomen: Soft, gravid, + tenderness Lt epigastric into LUQ to palpation  Pelvic: Cervical exam deferred         Extremities: Edema: None  Fetal Status: Fetal Heart Rate (bpm): 135 Fundal Height: 35 cm Movement: Present    Results for orders placed or performed in visit on 08/11/17 (from the past 24 hour(s))  POCT Urinalysis Dipstick   Collection Time: 08/11/17  3:01 PM  Result Value Ref Range   Color, UA     Clarity, UA     Glucose, UA Negative Negative   Bilirubin, UA     Ketones, UA neg    Spec Grav, UA  1.010 - 1.025   Blood, UA neg    pH, UA  5.0 - 8.0   Protein, UA Negative Negative   Urobilinogen, UA  0.2 or 1.0 E.U./dL   Nitrite, UA neg    Leukocytes, UA Negative Negative   Appearance     Odor    Results for orders placed or performed during the hospital encounter of 08/10/17 (from the past 24 hour(s))  CBC   Collection Time: 08/10/17  8:05 PM  Result Value Ref Range   WBC 11.0 (H) 4.0 - 10.5 K/uL   RBC 3.61 (L) 3.87 - 5.11 MIL/uL   Hemoglobin 11.1 (L) 12.0 - 15.0 g/dL   HCT 95.6 (L) 21.3 - 08.6 %   MCV 92.5 78.0 - 100.0 fL   MCH 30.7 26.0 - 34.0 pg   MCHC 33.2 30.0 - 36.0 g/dL  RDW 13.2 11.5 - 15.5 %   Platelets 186 150 - 400 K/uL  Lactate dehydrogenase   Collection Time: 08/10/17  8:05 PM  Result Value Ref Range   LDH 93 (L) 98 - 192 U/L  Comprehensive metabolic panel   Collection Time: 08/10/17  8:05 PM  Result Value Ref Range   Sodium 134 (L) 135 - 145 mmol/L   Potassium 3.6 3.5 - 5.1 mmol/L   Chloride 106 98 - 111 mmol/L   CO2 22 22 - 32 mmol/L   Glucose, Bld 97 70 - 99 mg/dL   BUN 5 (L) 6 - 20 mg/dL   Creatinine, Ser 6.570.49 0.44 - 1.00 mg/dL   Calcium 8.3 (L) 8.9 - 10.3 mg/dL   Total Protein 6.4 (L) 6.5 - 8.1 g/dL   Albumin 3.0 (L) 3.5 - 5.0 g/dL   AST 18 15 - 41 U/L   ALT 20 0 - 44 U/L   Alkaline Phosphatase 121 38 - 126 U/L   Total Bilirubin 0.4 0.3 - 1.2 mg/dL   GFR calc non Af Amer  >60 >60 mL/min   GFR calc Af Amer >60 >60 mL/min   Anion gap 6 5 - 15  Protein / creatinine ratio, urine   Collection Time: 08/10/17  9:30 PM  Result Value Ref Range   Creatinine, Urine 82.20 mg/dL   Total Protein, Urine 10 mg/dL   Protein Creatinine Ratio 0.12 0.00 - 0.15 mg/mg[Cre]    Assessment & Plan:  1) Low-risk pregnancy G2P1001 at 6035w6d with an Estimated Date of Delivery: 09/16/17   2) LUQ pain, no other sx, has reflux, possible gastritis, will rx protonix to see if pain improves  3) H/O opiate addiction> states doing ok, took two 1/4's of partners hydrocodone 10 d/t pain, and recent THC use, will send UDS  4) HSV2> rx acyclovir 400mg  tid #90 w/ 3RF   5) Limited pnc> no care from 18-31wks, missed pn2   Meds:  Meds ordered this encounter  Medications  . acyclovir (ZOVIRAX) 400 MG tablet    Sig: Take 1 tablet (400 mg total) by mouth 3 (three) times daily.    Dispense:  90 tablet    Refill:  3    Order Specific Question:   Supervising Provider    Answer:   Despina HiddenEURE, LUTHER H [[id number]]  . pantoprazole (PROTONIX) 20 MG tablet    Sig: Take 1 tablet (20 mg total) by mouth daily.    Dispense:  30 tablet    Refill:  6    Order Specific Question:   Supervising Provider    Answer:   Lazaro ArmsEURE, LUTHER H [[id number]]   Labs/procedures today: tdap  Plan:  Continue routine obstetrical care   Reviewed: Preterm labor symptoms and general obstetric precautions including but not limited to vaginal bleeding, contractions, leaking of fluid and fetal movement were reviewed in detail with the patient.  Recommended Tdap per CDC guidelines. All questions were answered  Follow-up: Return for ASAP for PN2 (no visit), then as scheduled next week for visit.  Orders Placed This Encounter  Procedures  . Tdap vaccine greater than or equal to 7yo IM  . Pain Management Screening Profile (10S)  . POCT Urinalysis Dipstick   Cheral MarkerKimberly R Lilianne Delair CNM, [facility]NP[facility]BC 08/11/2017 5:05 PM

## 2017-08-11 NOTE — Patient Instructions (Signed)
Ashley Levy, I greatly value your feedback.  If you receive a survey following your visit with [place] today, we appreciate you taking the time to fill it out.  Thanks, Joellyn Haff, CNM, WHNP-BC  You will have your sugar test next visit.  Please do not eat or drink anything after midnight the night before you come, not even water.  You will be here for at least two hours.      Call the office [phone]) or go to [facility] if:  You begin to have strong, frequent contractions  Your water breaks.  Sometimes it is a big gush of fluid, sometimes it is just a trickle that keeps getting your panties wet or running down your legs  You have vaginal bleeding.  It is normal to have a small amount of spotting if your cervix was checked.   You don't feel your baby moving like normal.  If you don't, get you something to eat and drink and lay down and focus on feeling your baby move.  You should feel at least 10 movements in 2 hours.  If you don't, you should call the office or go to [facility].    Tdap Vaccine  It is recommended that you get the Tdap vaccine during the third trimester of EACH pregnancy to help protect your baby from getting pertussis (whooping cough)  27-36 weeks is the BEST time to do this so that you can pass the protection on to your baby. During pregnancy is better than after pregnancy, but if you are unable to get it during pregnancy it will be offered at the hospital.   You can get this vaccine at the health department or your family doctor  Everyone who will be around your baby should also be up-to-date on their vaccines. Adults (who are not pregnant) only need 1 dose of Tdap during adulthood.   Third Trimester of Pregnancy The third trimester is from week 29 through week 42, months 7 through 9. The third trimester is a time when the fetus is growing rapidly. At the end of the ninth month, the fetus is about 20 inches in length and weighs 6-10 pounds.  BODY CHANGES Your  body goes through many changes during pregnancy. The changes vary from woman to woman.   Your weight will continue to increase. You can expect to gain 25-35 pounds (11-16 kg) by the end of the pregnancy.  You may begin to get stretch marks on your hips, abdomen, and breasts.  You may urinate more often because the fetus is moving lower into your pelvis and pressing on your bladder.  You may develop or continue to have heartburn as a result of your pregnancy.  You may develop constipation because certain hormones are causing the muscles that push waste through your intestines to slow down.  You may develop hemorrhoids or swollen, bulging veins (varicose veins).  You may have pelvic pain because of the weight gain and pregnancy hormones relaxing your joints between the bones in your pelvis. Backaches may result from overexertion of the muscles supporting your posture.  You may have changes in your hair. These can include thickening of your hair, rapid growth, and changes in texture. Some women also have hair loss during or after pregnancy, or hair that feels dry or thin. Your hair will most likely return to normal after your baby is born.  Your breasts will continue to grow and be tender. A yellow discharge may leak from your breasts called  colostrum.  Your belly button may stick out.  You may feel short of breath because of your expanding uterus.  You may notice the fetus "dropping," or moving lower in your abdomen.  You may have a bloody mucus discharge. This usually occurs a few days to a week before labor begins.  Your cervix becomes thin and soft (effaced) near your due date. WHAT TO EXPECT AT YOUR PRENATAL EXAMS  You will have prenatal exams every 2 weeks until week 36. Then, you will have weekly prenatal exams. During a routine prenatal visit:  You will be weighed to make sure you and the fetus are growing normally.  Your blood pressure is taken.  Your abdomen will be  measured to track your baby's growth.  The fetal heartbeat will be listened to.  Any test results from the previous visit will be discussed.  You may have a cervical check near your due date to see if you have effaced. At around 36 weeks, your caregiver will check your cervix. At the same time, your caregiver will also perform a test on the secretions of the vaginal tissue. This test is to determine if a type of bacteria, Group B streptococcus, is present. Your caregiver will explain this further. Your caregiver may ask you:  What your birth plan is.  How you are feeling.  If you are feeling the baby move.  If you have had any abnormal symptoms, such as leaking fluid, bleeding, severe headaches, or abdominal cramping.  If you have any questions. Other tests or screenings that may be performed during your third trimester include:  Blood tests that check for low iron levels (anemia).  Fetal testing to check the health, activity level, and growth of the fetus. Testing is done if you have certain medical conditions or if there are problems during the pregnancy. FALSE LABOR You may feel small, irregular contractions that eventually go away. These are called Braxton Hicks contractions, or false labor. Contractions may last for hours, days, or even weeks before true labor sets in. If contractions come at regular intervals, intensify, or become painful, it is best to be seen by your caregiver.  SIGNS OF LABOR   Menstrual-like cramps.  Contractions that are 5 minutes apart or less.  Contractions that start on the top of the uterus and spread down to the lower abdomen and back.  A sense of increased pelvic pressure or back pain.  A watery or bloody mucus discharge that comes from the vagina. If you have any of these signs before the 37th week of pregnancy, call your caregiver right away. You need to go to the hospital to get checked immediately. HOME CARE INSTRUCTIONS   Avoid all  smoking, herbs, alcohol, and unprescribed drugs. These chemicals affect the formation and growth of the baby.  Follow your caregiver's instructions regarding medicine use. There are medicines that are either safe or unsafe to take during pregnancy.  Exercise only as directed by your caregiver. Experiencing uterine cramps is a good sign to stop exercising.  Continue to eat regular, healthy meals.  Wear a good support bra for breast tenderness.  Do not use hot tubs, steam rooms, or saunas.  Wear your seat belt at all times when driving.  Avoid raw meat, uncooked cheese, cat litter boxes, and soil used by cats. These carry germs that can cause birth defects in the baby.  Take your prenatal vitamins.  Try taking a stool softener (if your caregiver approves) if you develop constipation.  Eat more high-fiber foods, such as fresh vegetables or fruit and whole grains. Drink plenty of fluids to keep your urine clear or pale yellow.  Take warm sitz baths to soothe any pain or discomfort caused by hemorrhoids. Use hemorrhoid cream if your caregiver approves.  If you develop varicose veins, wear support hose. Elevate your feet for 15 minutes, 3-4 times a day. Limit salt in your diet.  Avoid heavy lifting, wear low heal shoes, and practice good posture.  Rest a lot with your legs elevated if you have leg cramps or low back pain.  Visit your dentist if you have not gone during your pregnancy. Use a soft toothbrush to brush your teeth and be gentle when you floss.  A sexual relationship may be continued unless your caregiver directs you otherwise.  Do not travel far distances unless it is absolutely necessary and only with the approval of your caregiver.  Take prenatal classes to understand, practice, and ask questions about the labor and delivery.  Make a trial run to the hospital.  Pack your hospital bag.  Prepare the baby's nursery.  Continue to go to all your prenatal visits as directed  by your caregiver. SEEK MEDICAL CARE IF:  You are unsure if you are in labor or if your water has broken.  You have dizziness.  You have mild pelvic cramps, pelvic pressure, or nagging pain in your abdominal area.  You have persistent nausea, vomiting, or diarrhea.  You have a bad smelling vaginal discharge.  You have pain with urination. SEEK IMMEDIATE MEDICAL CARE IF:   You have a fever.  You are leaking fluid from your vagina.  You have spotting or bleeding from your vagina.  You have severe abdominal cramping or pain.  You have rapid weight loss or gain.  You have shortness of breath with chest pain.  You notice sudden or extreme swelling of your face, hands, ankles, feet, or legs.  You have not felt your baby move in over an hour.  You have severe headaches that do not go away with medicine.  You have vision changes. Document Released: 01/22/2001 Document Revised: 02/02/2013 Document Reviewed: 03/31/2012 [facility] Patient Information 2015 [place], [place]. This information is not intended to replace advice given to you by your health care provider. Make sure you discuss any questions you have with your health care provider.

## 2017-08-11 NOTE — Telephone Encounter (Signed)
Patient with continued complaints of left upper abdominal pain that is only relieved by laying on her back.  States she is at the point of having difficulty breathing at times and just wants to cry.  Went to the ER last night and was advised to see us for f/u.  Will w/i this afternoon with Selena BattenKim.

## 2017-08-13 ENCOUNTER — Other Ambulatory Visit: Payer: Medicaid Other

## 2017-08-13 DIAGNOSIS — Z131 Encounter for screening for diabetes mellitus: Secondary | ICD-10-CM

## 2017-08-13 DIAGNOSIS — Z3483 Encounter for supervision of other normal pregnancy, third trimester: Secondary | ICD-10-CM

## 2017-08-13 DIAGNOSIS — Z3A35 35 weeks gestation of pregnancy: Secondary | ICD-10-CM

## 2017-08-13 LAB — PMP SCREEN PROFILE (10S), URINE
Amphetamine Scrn, Ur: NEGATIVE ng/mL
BARBITURATE SCREEN URINE: NEGATIVE ng/mL
BENZODIAZEPINE SCREEN, URINE: NEGATIVE ng/mL
CANNABINOIDS UR QL SCN: POSITIVE ng/mL — AB
CREATININE(CRT), U: 73.2 mg/dL (ref 20.0–300.0)
Cocaine (Metab) Scrn, Ur: NEGATIVE ng/mL
METHADONE SCREEN, URINE: NEGATIVE ng/mL
OXYCODONE+OXYMORPHONE UR QL SCN: NEGATIVE ng/mL
Opiate Scrn, Ur: POSITIVE ng/mL — AB
PH UR, DRUG SCRN: 7.5 (ref 4.5–8.9)
PROPOXYPHENE SCREEN URINE: NEGATIVE ng/mL
Phencyclidine Qn, Ur: NEGATIVE ng/mL

## 2017-08-14 LAB — CBC
Hematocrit: 34.5 % (ref 34.0–46.6)
Hemoglobin: 11.4 g/dL (ref 11.1–15.9)
MCH: 30.9 pg (ref 26.6–33.0)
MCHC: 33 g/dL (ref 31.5–35.7)
MCV: 94 fL (ref 79–97)
Platelets: 197 10*3/uL (ref 150–450)
RBC: 3.69 x10E6/uL — AB (ref 3.77–5.28)
RDW: 12.4 % (ref 12.3–15.4)
WBC: 10.4 10*3/uL (ref 3.4–10.8)

## 2017-08-14 LAB — RPR: RPR: NONREACTIVE

## 2017-08-14 LAB — GLUCOSE TOLERANCE, 2 HOURS W/ 1HR
GLUCOSE, 2 HOUR: 130 mg/dL (ref 65–152)
Glucose, 1 hour: 159 mg/dL (ref 65–179)
Glucose, Fasting: 73 mg/dL (ref 65–91)

## 2017-08-14 LAB — ANTIBODY SCREEN: Antibody Screen: NEGATIVE

## 2017-08-14 LAB — HIV ANTIBODY (ROUTINE TESTING W REFLEX): HIV SCREEN 4TH GENERATION: NONREACTIVE

## 2017-08-18 ENCOUNTER — Ambulatory Visit (INDEPENDENT_AMBULATORY_CARE_PROVIDER_SITE_OTHER): Payer: Medicaid Other | Admitting: Obstetrics & Gynecology

## 2017-08-18 ENCOUNTER — Encounter: Payer: Self-pay | Admitting: Obstetrics & Gynecology

## 2017-08-18 VITALS — BP 107/69 | HR 83 | Wt 163.0 lb

## 2017-08-18 DIAGNOSIS — Z3A35 35 weeks gestation of pregnancy: Secondary | ICD-10-CM

## 2017-08-18 DIAGNOSIS — Z1389 Encounter for screening for other disorder: Secondary | ICD-10-CM

## 2017-08-18 DIAGNOSIS — Z331 Pregnant state, incidental: Secondary | ICD-10-CM

## 2017-08-18 DIAGNOSIS — Z3483 Encounter for supervision of other normal pregnancy, third trimester: Secondary | ICD-10-CM

## 2017-08-18 LAB — POCT URINALYSIS DIPSTICK
Blood, UA: NEGATIVE
Glucose, UA: NEGATIVE
Ketones, UA: NEGATIVE
Leukocytes, UA: NEGATIVE
NITRITE UA: NEGATIVE
PROTEIN UA: NEGATIVE

## 2017-08-18 NOTE — Progress Notes (Signed)
G2P1001 3776w6d Estimated Date of Delivery: 09/16/17  Blood pressure 107/69, pulse 83, weight 163 lb (73.9 kg).   BP weight and urine results all reviewed and noted.  Please refer to the obstetrical flow sheet for the fundal height and fetal heart rate documentation:  Patient reports good fetal movement, denies any bleeding and no rupture of membranes symptoms or regular contractions. Patient is without complaints. All questions were answered.  Orders Placed This Encounter  Procedures  . POCT Urinalysis Dipstick    Plan:  Continued routine obstetrical care,   Return in about 1 week (around 08/25/2017) for LROB.

## 2017-08-25 ENCOUNTER — Encounter: Payer: Medicaid Other | Admitting: Women's Health

## 2017-08-28 ENCOUNTER — Encounter: Payer: Self-pay | Admitting: Obstetrics & Gynecology

## 2017-08-28 ENCOUNTER — Ambulatory Visit (INDEPENDENT_AMBULATORY_CARE_PROVIDER_SITE_OTHER): Payer: Medicaid Other | Admitting: Obstetrics & Gynecology

## 2017-08-28 VITALS — Wt 166.0 lb

## 2017-08-28 DIAGNOSIS — Z331 Pregnant state, incidental: Secondary | ICD-10-CM

## 2017-08-28 DIAGNOSIS — Z3483 Encounter for supervision of other normal pregnancy, third trimester: Secondary | ICD-10-CM

## 2017-08-28 DIAGNOSIS — Z1389 Encounter for screening for other disorder: Secondary | ICD-10-CM

## 2017-08-28 DIAGNOSIS — Z3A37 37 weeks gestation of pregnancy: Secondary | ICD-10-CM

## 2017-08-28 LAB — POCT URINALYSIS DIPSTICK OB
GLUCOSE, UA: NEGATIVE — AB
Ketones, UA: NEGATIVE
LEUKOCYTES UA: NEGATIVE
Nitrite, UA: NEGATIVE
POC,PROTEIN,UA: NEGATIVE
RBC UA: NEGATIVE

## 2017-08-28 MED ORDER — PROMETHAZINE HCL 25 MG PO TABS: 25.0000 mg | ORAL_TABLET | Freq: Four times a day (QID) | ORAL | 1 refills | Status: DC | PRN

## 2017-08-28 NOTE — Progress Notes (Signed)
G2P1001 5622w2d Estimated Date of Delivery: 09/16/17  Weight 166 lb (75.3 kg).   BP weight and urine results all reviewed and noted.  Please refer to the obstetrical flow sheet for the fundal height and fetal heart rate documentation:  Patient reports good fetal movement, denies any bleeding and no rupture of membranes symptoms or regular contractions. Patient is without complaints. All questions were answered.  Orders Placed This Encounter  Procedures  . Strep Gp B NAA  . GC/Chlamydia Probe Amp  . POC Urinalysis Dipstick OB    Plan:  Continued routine obstetrical care, cx FT/th/ball/vtx  Return in about 1 week (around 09/04/2017) for LROB.

## 2017-08-28 NOTE — Addendum Note (Signed)
Addended by: Lazaro ArmsEURE, LUTHER H on: 08/28/2017 04:59 PM   Modules accepted: Orders

## 2017-08-30 LAB — GC/CHLAMYDIA PROBE AMP
CHLAMYDIA, DNA PROBE: NEGATIVE
Neisseria gonorrhoeae by PCR: NEGATIVE

## 2017-08-30 LAB — STREP GP B NAA: Strep Gp B NAA: NEGATIVE

## 2017-09-04 ENCOUNTER — Ambulatory Visit (INDEPENDENT_AMBULATORY_CARE_PROVIDER_SITE_OTHER): Payer: Medicaid Other | Admitting: Women's Health

## 2017-09-04 ENCOUNTER — Encounter: Payer: Self-pay | Admitting: Women's Health

## 2017-09-04 VITALS — BP 115/73 | HR 75 | Wt 169.5 lb

## 2017-09-04 DIAGNOSIS — Z331 Pregnant state, incidental: Secondary | ICD-10-CM

## 2017-09-04 DIAGNOSIS — Z3483 Encounter for supervision of other normal pregnancy, third trimester: Secondary | ICD-10-CM

## 2017-09-04 DIAGNOSIS — F1120 Opioid dependence, uncomplicated: Secondary | ICD-10-CM

## 2017-09-04 DIAGNOSIS — Z1389 Encounter for screening for other disorder: Secondary | ICD-10-CM

## 2017-09-04 DIAGNOSIS — O99320 Drug use complicating pregnancy, unspecified trimester: Secondary | ICD-10-CM

## 2017-09-04 LAB — POCT URINALYSIS DIPSTICK OB
Blood, UA: NEGATIVE
Glucose, UA: NEGATIVE — AB
KETONES UA: NEGATIVE
Leukocytes, UA: NEGATIVE
Nitrite, UA: NEGATIVE
POC,PROTEIN,UA: NEGATIVE

## 2017-09-04 NOTE — Progress Notes (Signed)
   LOW-RISK PREGNANCY VISIT Patient name: Ashley PeoplesCasey Levy MRN [id number]  Date of birth: [birth year] Chief Complaint:   Routine Prenatal Visit (swelling in right ankle; low back pain; hips hurt)  History of Present Illness:   Ashley Levy is a 32 y.o. 332P1001 female at 4540w2d with an Estimated Date of Delivery: 09/16/17 being seen today for ongoing management of a low-risk pregnancy.  Today she reports normal aches of pregnancy, low back, hips, swelling. Started subutex 2wks ago w/ Dr. Cathey EndowBowen, goes weekly. Started on 8mg  BID but made her sick, so is taking 4mg  BID now. Contractions: Not present. Vag. Bleeding: None.  Movement: Present. denies leaking of fluid. Review of Systems:   Pertinent items are noted in HPI Denies abnormal vaginal discharge w/ itching/odor/irritation, headaches, visual changes, shortness of breath, chest pain, abdominal pain, severe nausea/vomiting, or problems with urination or bowel movements unless otherwise stated above. Pertinent History Reviewed:  Reviewed past medical,surgical, social, obstetrical and family history.  Reviewed problem list, medications and allergies. Physical Assessment:   Vitals:   09/04/17 1006  BP: 115/73  Pulse: 75  Weight: 169 lb 8 oz (76.9 kg)  Body mass index is 32.03 kg/m.        Physical Examination:   General appearance: Well appearing, and in no distress  Mental status: Alert, oriented to person, place, and time  Skin: Warm & dry  Cardiovascular: Normal heart rate noted  Respiratory: Normal respiratory effort, no distress  Abdomen: Soft, gravid, nontender  Pelvic: Cervical exam performed  Dilation: 2 Effacement (%): 50 Station: Ballotable  Extremities: Edema: Mild pitting, slight indentation  Fetal Status: Fetal Heart Rate (bpm): 130 Fundal Height: 38 cm Movement: Present Presentation: Vertex  Results for orders placed or performed in visit on 09/04/17 (from the past 24 hour(s))  POC Urinalysis Dipstick OB   Collection Time:  09/04/17 10:07 AM  Result Value Ref Range   Color, UA     Clarity, UA     Glucose, UA Negative (A) (none)   Bilirubin, UA     Ketones, UA neg    Spec Grav, UA  1.010 - 1.025   Blood, UA neg    pH, UA  5.0 - 8.0   POC Protein UA Negative Negative, Trace   Urobilinogen, UA  0.2 or 1.0 E.U./dL   Nitrite, UA neg    Leukocytes, UA Negative Negative   Appearance     Odor      Assessment & Plan:  1) Low-risk pregnancy G2P1001 at 5440w2d with an Estimated Date of Delivery: 09/16/17   2) Subutex therapy, 4mg  BID w/ Dr. Cathey EndowBowen, goes weekly   Meds: No orders of the defined types were placed in this encounter.  Labs/procedures today: sve  Plan:  Continue routine obstetrical care   Reviewed: Term labor symptoms and general obstetric precautions including but not limited to vaginal bleeding, contractions, leaking of fluid and fetal movement were reviewed in detail with the patient.  All questions were answered  Follow-up: Return in about 1 week (around 09/11/2017) for LROB.  Orders Placed This Encounter  Procedures  . POC Urinalysis Dipstick OB   Cheral MarkerKimberly R Tanina Barb CNM, [facility]NP-BC 09/04/2017 10:40 AM

## 2017-09-04 NOTE — Patient Instructions (Signed)
Ashley Levy, I greatly value your feedback.  If you receive a survey following your visit with us today, we appreciate you taking the time to fill it out.  Thanks, Ashley Levy, CNM, WHNP-BC   Call the office [phone]) or go to [facility] if:  You begin to have strong, frequent contractions  Your water breaks.  Sometimes it is a big gush of fluid, sometimes it is just a trickle that keeps getting your panties wet or running down your legs  You have vaginal bleeding.  It is normal to have a small amount of spotting if your cervix was checked.   You don't feel your baby moving like normal.  If you don't, get you something to eat and drink and lay down and focus on feeling your baby move.  You should feel at least 10 movements in 2 hours.  If you don't, you should call the office or go to [facility].     [facility] Contractions Contractions of the uterus can occur throughout pregnancy, but they are not always a sign that you are in labor. You may have practice contractions called Braxton Hicks contractions. These false labor contractions are sometimes confused with true labor. What are Ashley PeltonBraxton Hicks contractions? Braxton Hicks contractions are tightening movements that occur in the muscles of the uterus before labor. Unlike true labor contractions, these contractions do not result in opening (dilation) and thinning of the cervix. Toward the end of pregnancy (32-34 weeks), Braxton Hicks contractions can happen more often and may become stronger. These contractions are sometimes difficult to tell apart from true labor because they can be very uncomfortable. You should not feel embarrassed if you go to the hospital with false labor. Sometimes, the only way to tell if you are in true labor is for your health care provider to look for changes in the cervix. The health care provider will do a physical exam and may monitor your contractions. If you are not in true labor, the exam should show  that your cervix is not dilating and your water has not broken. If there are other health problems associated with your pregnancy, it is completely safe for you to be sent home with false labor. You may continue to have Braxton Hicks contractions until you go into true labor. How to tell the difference between true labor and false labor True labor  Contractions last 30-70 seconds.  Contractions become very regular.  Discomfort is usually felt in the top of the uterus, and it spreads to the lower abdomen and low back.  Contractions do not go away with walking.  Contractions usually become more intense and increase in frequency.  The cervix dilates and gets thinner. False labor  Contractions are usually shorter and not as strong as true labor contractions.  Contractions are usually irregular.  Contractions are often felt in the front of the lower abdomen and in the groin.  Contractions may go away when you walk around or change positions while lying down.  Contractions get weaker and are shorter-lasting as time goes on.  The cervix usually does not dilate or become thin. Follow these instructions at home:  Take over-the-counter and prescription medicines only as told by your health care provider.  Keep up with your usual exercises and follow other instructions from your health care provider.  Eat and drink lightly if you think you are going into labor.  If Braxton Hicks contractions are making you uncomfortable: ? Change your position from lying down  or resting to walking, or change from walking to resting. ? Sit and rest in a tub of warm water. ? Drink enough fluid to keep your urine pale yellow. Dehydration may cause these contractions. ? Do slow and deep breathing several times an hour.  Keep all follow-up prenatal visits as told by your health care provider. This is important. Contact a health care provider if:  You have a fever.  You have continuous pain in your  abdomen. Get help right away if:  Your contractions become stronger, more regular, and closer together.  You have fluid leaking or gushing from your vagina.  You pass blood-tinged mucus (bloody show).  You have bleeding from your vagina.  You have low back pain that you never had before.  You feel your baby's head pushing down and causing pelvic pressure.  Your baby is not moving inside you as much as it used to. Summary  Contractions that occur before labor are called Braxton Hicks contractions, false labor, or practice contractions.  Braxton Hicks contractions are usually shorter, weaker, farther apart, and less regular than true labor contractions. True labor contractions usually become progressively stronger and regular and they become more frequent.  Manage discomfort from [facility] contractions by changing position, resting in a warm bath, drinking plenty of water, or practicing deep breathing. This information is not intended to replace advice given to you by your health care provider. Make sure you discuss any questions you have with your health care provider. Document Released: 06/13/2016 Document Revised: 06/13/2016 Document Reviewed: 06/13/2016 Elsevier Interactive Patient Education  2018 [employer].

## 2017-09-05 ENCOUNTER — Telehealth: Payer: Self-pay | Admitting: *Deleted

## 2017-09-05 NOTE — Telephone Encounter (Signed)
Ashley Levy with [facility] called with Hgb 10.8. Patient is taking flint stones but is having problems with constipation. Advised to add iron supplement and will see next week.

## 2017-09-11 ENCOUNTER — Encounter (INDEPENDENT_AMBULATORY_CARE_PROVIDER_SITE_OTHER): Payer: Self-pay

## 2017-09-11 ENCOUNTER — Ambulatory Visit (INDEPENDENT_AMBULATORY_CARE_PROVIDER_SITE_OTHER): Payer: Medicaid Other | Admitting: Women's Health

## 2017-09-11 ENCOUNTER — Encounter: Payer: Self-pay | Admitting: Women's Health

## 2017-09-11 VITALS — BP 112/68 | HR 87 | Wt 174.0 lb

## 2017-09-11 DIAGNOSIS — O99320 Drug use complicating pregnancy, unspecified trimester: Secondary | ICD-10-CM

## 2017-09-11 DIAGNOSIS — F1120 Opioid dependence, uncomplicated: Secondary | ICD-10-CM

## 2017-09-11 DIAGNOSIS — Z3483 Encounter for supervision of other normal pregnancy, third trimester: Secondary | ICD-10-CM

## 2017-09-11 DIAGNOSIS — Z3A39 39 weeks gestation of pregnancy: Secondary | ICD-10-CM

## 2017-09-11 DIAGNOSIS — Z1389 Encounter for screening for other disorder: Secondary | ICD-10-CM

## 2017-09-11 DIAGNOSIS — Z331 Pregnant state, incidental: Secondary | ICD-10-CM

## 2017-09-11 LAB — POCT URINALYSIS DIPSTICK OB
Glucose, UA: NEGATIVE — AB
Ketones, UA: NEGATIVE
NITRITE UA: NEGATIVE
PROTEIN: NEGATIVE

## 2017-09-11 NOTE — Progress Notes (Signed)
   LOW-RISK PREGNANCY VISIT Patient name: Ashley Levy MRN [id number]  Date of birth: August 05, 1985 Chief Complaint:   Routine Prenatal Visit  History of Present Illness:   Ashley Levy is a 32 y.o. 252P1001 female at 6943w2d with an Estimated Date of Delivery: 09/16/17 being seen today for ongoing management of a low-risk pregnancy.  Today she reports feet swelling. Contractions: Not present. Vag. Bleeding: None.  Movement: Present. denies leaking of fluid. Review of Systems:   Pertinent items are noted in HPI Denies abnormal vaginal discharge w/ itching/odor/irritation, headaches, visual changes, shortness of breath, chest pain, abdominal pain, severe nausea/vomiting, or problems with urination or bowel movements unless otherwise stated above. Pertinent History Reviewed:  Reviewed past medical,surgical, social, obstetrical and family history.  Reviewed problem list, medications and allergies. Physical Assessment:   Vitals:   09/11/17 1639  BP: 112/68  Pulse: 87  Weight: 174 lb (78.9 kg)  Body mass index is 32.88 kg/m.        Physical Examination:   General appearance: Well appearing, and in no distress  Mental status: Alert, oriented to person, place, and time  Skin: Warm & dry  Cardiovascular: Normal heart rate noted  Respiratory: Normal respiratory effort, no distress  Abdomen: Soft, gravid, nontender  Pelvic: Cervical exam performed  Dilation: 3 Effacement (%): 50 Station: [employer] Offered membrane sweeping, discussed r/b- pt declined, maybe next week   Extremities: Edema: Moderate pitting, indentation subsides rapidly  Fetal Status: Fetal Heart Rate (bpm): 125 Fundal Height: 39 cm Movement: Present Presentation: Vertex  Results for orders placed or performed in visit on 09/11/17 (from the past 24 hour(s))  POC Urinalysis Dipstick OB   Collection Time: 09/11/17  4:42 PM  Result Value Ref Range   Color, UA     Clarity, UA     Glucose, UA Negative (A) (none)   Bilirubin, UA     Ketones, UA neg    Spec Grav, UA  1.010 - 1.025   Blood, UA trace    pH, UA  5.0 - 8.0   POC Protein UA Negative Negative, Trace   Urobilinogen, UA  0.2 or 1.0 E.U./dL   Nitrite, UA neg    Leukocytes, UA Small (1+) (A) Negative   Appearance     Odor      Assessment & Plan:  1) Low-risk pregnancy G2P1001 at 7543w2d with an Estimated Date of Delivery: 09/16/17   2) Dependent edema, lower belly/legs  3) 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 Cathey EndowBowen, goes weekly   Meds: No orders of the defined types were placed in this encounter.  Labs/procedures today: sve  Plan:  Continue routine obstetrical care   Reviewed: Term labor symptoms and general obstetric precautions including but not limited to vaginal bleeding, contractions, leaking of fluid and fetal movement were reviewed in detail with the patient.  All questions were answered  Follow-up: Return in about 5 days (around 09/16/2017) for LROB.  Orders Placed This Encounter  Procedures  . POC Urinalysis Dipstick OB   Cheral MarkerKimberly R Kela Baccari CNM, [facility] BrooklynWHNP-BC 09/11/2017 5:05 PM

## 2017-09-11 NOTE — Patient Instructions (Signed)
Ashley Levy, I greatly value your feedback.  If you receive a survey following your visit with Ashley Levy today, we appreciate you taking the time to fill it out.  Thanks, Ashley Levy, CNM, WHNP-BC   Call the office [phone]) or go to [facility] if:  You begin to have strong, frequent contractions  Your water breaks.  Sometimes it is a big gush of fluid, sometimes it is just a trickle that keeps getting your panties wet or running down your legs  You have vaginal bleeding.  It is normal to have a small amount of spotting if your cervix was checked.   You don't feel your baby moving like normal.  If you don't, get you something to eat and drink and lay down and focus on feeling your baby move.  You should feel at least 10 movements in 2 hours.  If you don't, you should call the office or [facility].     [facility] Contractions Contractions of the uterus can occur throughout pregnancy, but they are not always a sign that you are in labor. You may have practice contractions called Braxton Hicks contractions. These false labor contractions are sometimes confused with true labor. What are Ashley PeltonBraxton Hicks contractions? Braxton Hicks contractions are tightening movements that occur in the muscles of the uterus before labor. Unlike true labor contractions, these contractions do not result in opening (dilation) and thinning of the cervix. Toward the end of pregnancy (32-34 weeks), Braxton Hicks contractions can happen more often and may become stronger. These contractions are sometimes difficult to tell apart from true labor because they can be very uncomfortable. You should not feel embarrassed if you go to the hospital with false labor. Sometimes, the only way to tell if you are in true labor is for your health care provider to look for changes in the cervix. The health care provider will do a physical exam and may monitor your contractions. If you are not in true labor, the exam should show  that your cervix is not dilating and your water has not broken. If there are other health problems associated with your pregnancy, it is completely safe for you to be sent home with false labor. You may continue to have Braxton Hicks contractions until you go into true labor. How to tell the difference between true labor and false labor True labor  Contractions last 30-70 seconds.  Contractions become very regular.  Discomfort is usually felt in the top of the uterus, and it spreads to the lower abdomen and low back.  Contractions do not go away with walking.  Contractions usually become more intense and increase in frequency.  The cervix dilates and gets thinner. False labor  Contractions are usually shorter and not as strong as true labor contractions.  Contractions are usually irregular.  Contractions are often felt in the front of the lower abdomen and in the groin.  Contractions may go away when you walk around or change positions while lying down.  Contractions get weaker and are shorter-lasting as time goes on.  The cervix usually does not dilate or become thin. Follow these instructions at home:  Take over-the-counter and prescription medicines only as told by your health care provider.  Keep up with your usual exercises and follow other instructions from your health care provider.  Eat and drink lightly if you think you are going into labor.  If Braxton Hicks contractions are making you uncomfortable: ? Change your position from lying down  or resting to walking, or change from walking to resting. ? Sit and rest in a tub of warm water. ? Drink enough fluid to keep your urine pale yellow. Dehydration may cause these contractions. ? Do slow and deep breathing several times an hour.  Keep all follow-up prenatal visits as told by your health care provider. This is important. Contact a health care provider if:  You have a fever.  You have continuous pain in your  abdomen. Get help right away if:  Your contractions become stronger, more regular, and closer together.  You have fluid leaking or gushing from your vagina.  You pass blood-tinged mucus (bloody show).  You have bleeding from your vagina.  You have low back pain that you never had before.  You feel your baby's head pushing down and causing pelvic pressure.  Your baby is not moving inside you as much as it used to. Summary  Contractions that occur before labor are called Braxton Hicks contractions, false labor, or practice contractions.  Braxton Hicks contractions are usually shorter, weaker, farther apart, and less regular than true labor contractions. True labor contractions usually become progressively stronger and regular and they become more frequent.  Manage discomfort from [facility] contractions by changing position, resting in a warm bath, drinking plenty of water, or practicing deep breathing. This information is not intended to replace advice given to you by your health care provider. Make sure you discuss any questions you have with your health care provider. Document Released: 06/13/2016 Document Revised: 06/13/2016 Document Reviewed: 06/13/2016 Elsevier Interactive Patient Education  2018 [employer].

## 2017-09-15 ENCOUNTER — Inpatient Hospital Stay (HOSPITAL_COMMUNITY): Payer: Medicaid Other | Admitting: Anesthesiology

## 2017-09-15 ENCOUNTER — Inpatient Hospital Stay (HOSPITAL_COMMUNITY)
Admission: AD | Admit: 2017-09-15 | Discharge: 2017-09-18 | DRG: 797 | Disposition: A | Discharge status: Home / Self Care | Payer: Medicaid Other | Attending: Obstetrics and Gynecology | Admitting: Obstetrics and Gynecology

## 2017-09-15 ENCOUNTER — Encounter (HOSPITAL_COMMUNITY): Payer: Self-pay

## 2017-09-15 DIAGNOSIS — F17210 Nicotine dependence, cigarettes, uncomplicated: Secondary | ICD-10-CM | POA: Diagnosis present

## 2017-09-15 DIAGNOSIS — O9089 Other complications of the puerperium, not elsewhere classified: Secondary | ICD-10-CM | POA: Diagnosis not present

## 2017-09-15 DIAGNOSIS — R109 Unspecified abdominal pain: Secondary | ICD-10-CM | POA: Diagnosis not present

## 2017-09-15 DIAGNOSIS — Z302 Encounter for sterilization: Secondary | ICD-10-CM | POA: Diagnosis not present

## 2017-09-15 DIAGNOSIS — O99324 Drug use complicating childbirth: Principal | ICD-10-CM | POA: Diagnosis present

## 2017-09-15 DIAGNOSIS — O99334 Smoking (tobacco) complicating childbirth: Secondary | ICD-10-CM | POA: Diagnosis present

## 2017-09-15 DIAGNOSIS — O9832 Other infections with a predominantly sexual mode of transmission complicating childbirth: Secondary | ICD-10-CM | POA: Diagnosis present

## 2017-09-15 DIAGNOSIS — Z3A39 39 weeks gestation of pregnancy: Secondary | ICD-10-CM

## 2017-09-15 DIAGNOSIS — F1290 Cannabis use, unspecified, uncomplicated: Secondary | ICD-10-CM | POA: Diagnosis present

## 2017-09-15 DIAGNOSIS — A6000 Herpesviral infection of urogenital system, unspecified: Secondary | ICD-10-CM | POA: Diagnosis present

## 2017-09-15 DIAGNOSIS — Z3483 Encounter for supervision of other normal pregnancy, third trimester: Secondary | ICD-10-CM | POA: Diagnosis present

## 2017-09-15 DIAGNOSIS — M25451 Effusion, right hip: Secondary | ICD-10-CM | POA: Diagnosis not present

## 2017-09-15 DIAGNOSIS — M25551 Pain in right hip: Secondary | ICD-10-CM | POA: Diagnosis not present

## 2017-09-15 DIAGNOSIS — F1120 Opioid dependence, uncomplicated: Secondary | ICD-10-CM | POA: Diagnosis present

## 2017-09-15 LAB — CBC
HCT: 33.5 % — ABNORMAL LOW (ref 36.0–46.0)
Hemoglobin: 11.3 g/dL — ABNORMAL LOW (ref 12.0–15.0)
MCH: 30.1 pg (ref 26.0–34.0)
MCHC: 33.7 g/dL (ref 30.0–36.0)
MCV: 89.3 fL (ref 78.0–100.0)
Platelets: 156 10*3/uL (ref 150–400)
RBC: 3.75 MIL/uL — ABNORMAL LOW (ref 3.87–5.11)
RDW: 13.8 % (ref 11.5–15.5)
WBC: 14.7 10*3/uL — ABNORMAL HIGH (ref 4.0–10.5)

## 2017-09-15 LAB — RAPID URINE DRUG SCREEN, HOSP PERFORMED
Amphetamines: NOT DETECTED
Barbiturates: NOT DETECTED
Benzodiazepines: NOT DETECTED
Cocaine: NOT DETECTED
Opiates: NOT DETECTED
Tetrahydrocannabinol: NOT DETECTED

## 2017-09-15 LAB — POCT FERN TEST: POCT Fern Test: POSITIVE — AB

## 2017-09-15 LAB — TYPE AND SCREEN
ABO/RH(D): A POS
Antibody Screen: NEGATIVE

## 2017-09-15 LAB — RPR: RPR Ser Ql: NONREACTIVE

## 2017-09-15 MED ORDER — BENZOCAINE-MENTHOL 20-0.5 % EX AERO
1.0000 "application " | INHALATION_SPRAY | CUTANEOUS | Status: DC | PRN
  Administered 2017-09-15: 1 via TOPICAL
  Filled 2017-09-15: qty 56

## 2017-09-15 MED ORDER — PHENYLEPHRINE 40 MCG/ML (10ML) SYRINGE FOR IV PUSH (FOR BLOOD PRESSURE SUPPORT)
PREFILLED_SYRINGE | INTRAVENOUS | Status: AC
  Filled 2017-09-15: qty 10

## 2017-09-15 MED ORDER — FENTANYL 2.5 MCG/ML BUPIVACAINE 1/10 % EPIDURAL INFUSION (WH - ANES)
INTRAMUSCULAR | Status: AC
  Filled 2017-09-15: qty 100

## 2017-09-15 MED ORDER — BUPRENORPHINE HCL 2 MG SL SUBL
4.0000 mg | SUBLINGUAL_TABLET | Freq: Three times a day (TID) | SUBLINGUAL | Status: DC
  Administered 2017-09-15 – 2017-09-18 (×10): 4 mg via SUBLINGUAL
  Filled 2017-09-15 (×11): qty 2

## 2017-09-15 MED ORDER — DIBUCAINE 1 % RE OINT
1.0000 | TOPICAL_OINTMENT | RECTAL | Status: DC | PRN
Start: 2017-09-15 — End: 2017-09-18

## 2017-09-15 MED ORDER — BUPRENORPHINE HCL 8 MG SL SUBL
8.0000 mg | SUBLINGUAL_TABLET | Freq: Every day | SUBLINGUAL | Status: DC
  Administered 2017-09-15: 8 mg via SUBLINGUAL
  Filled 2017-09-15: qty 1

## 2017-09-15 MED ORDER — IBUPROFEN 600 MG PO TABS
600.0000 mg | ORAL_TABLET | Freq: Four times a day (QID) | ORAL | Status: DC
  Administered 2017-09-15 – 2017-09-18 (×14): 600 mg via ORAL
  Filled 2017-09-15 (×14): qty 1

## 2017-09-15 MED ORDER — SODIUM BICARBONATE 8.4 % IV SOLN
INTRAVENOUS | Status: DC | PRN
  Administered 2017-09-15: 5 mL via EPIDURAL
  Administered 2017-09-15: 4 mL via EPIDURAL

## 2017-09-15 MED ORDER — LACTATED RINGERS IV SOLN
INTRAVENOUS | Status: DC
  Administered 2017-09-16: 06:00:00 via INTRAVENOUS

## 2017-09-15 MED ORDER — IBUPROFEN 600 MG PO TABS: 600.0000 mg | ORAL_TABLET | Freq: Four times a day (QID) | ORAL | Status: DC

## 2017-09-15 MED ORDER — WITCH HAZEL-GLYCERIN EX PADS: 1.0000 "application " | MEDICATED_PAD | CUTANEOUS | Status: DC | PRN

## 2017-09-15 MED ORDER — PRENATAL MULTIVITAMIN CH
1.0000 | ORAL_TABLET | Freq: Every day | ORAL | Status: DC
  Filled 2017-09-15 (×3): qty 1

## 2017-09-15 MED ORDER — FENTANYL 2.5 MCG/ML BUPIVACAINE 1/10 % EPIDURAL INFUSION (WH - ANES): 14.0000 mL/h | INTRAMUSCULAR | Status: DC | PRN

## 2017-09-15 MED ORDER — LACTATED RINGERS IV SOLN: 500.0000 mL | INTRAVENOUS | Status: DC | PRN

## 2017-09-15 MED ORDER — OXYCODONE-ACETAMINOPHEN 5-325 MG PO TABS: 1.0000 | ORAL_TABLET | ORAL | Status: DC | PRN

## 2017-09-15 MED ORDER — SENNOSIDES-DOCUSATE SODIUM 8.6-50 MG PO TABS
2.0000 | ORAL_TABLET | ORAL | Status: DC
  Administered 2017-09-16 – 2017-09-18 (×3): 2 via ORAL
  Filled 2017-09-15 (×3): qty 2

## 2017-09-15 MED ORDER — SODIUM CHLORIDE 0.9% FLUSH
3.0000 mL | Freq: Two times a day (BID) | INTRAVENOUS | Status: DC
  Administered 2017-09-16: 3 mL via INTRAVENOUS

## 2017-09-15 MED ORDER — PHENYLEPHRINE 40 MCG/ML (10ML) SYRINGE FOR IV PUSH (FOR BLOOD PRESSURE SUPPORT)
80.0000 ug | PREFILLED_SYRINGE | INTRAVENOUS | Status: DC | PRN
  Filled 2017-09-15: qty 5

## 2017-09-15 MED ORDER — LIDOCAINE HCL (PF) 1 % IJ SOLN
30.0000 mL | INTRAMUSCULAR | Status: AC | PRN
  Administered 2017-09-15: 30 mL via SUBCUTANEOUS
  Filled 2017-09-15: qty 30

## 2017-09-15 MED ORDER — SODIUM CHLORIDE 0.9% FLUSH: 3.0000 mL | INTRAVENOUS | Status: DC | PRN

## 2017-09-15 MED ORDER — OXYTOCIN 40 UNITS IN LACTATED RINGERS INFUSION - SIMPLE MED
INTRAVENOUS | Status: AC
  Filled 2017-09-15: qty 1000

## 2017-09-15 MED ORDER — SODIUM CHLORIDE 0.9 % IV SOLN
INTRAVENOUS | Status: DC | PRN
  Administered 2017-09-15: 12 mL/h via EPIDURAL

## 2017-09-15 MED ORDER — OXYTOCIN BOLUS FROM INFUSION
500.0000 mL | Freq: Once | INTRAVENOUS | Status: AC
  Administered 2017-09-15: 500 mL via INTRAVENOUS

## 2017-09-15 MED ORDER — DIPHENHYDRAMINE HCL 25 MG PO CAPS: 25.0000 mg | ORAL_CAPSULE | Freq: Four times a day (QID) | ORAL | Status: DC | PRN

## 2017-09-15 MED ORDER — LACTATED RINGERS IV SOLN: INTRAVENOUS | Status: DC

## 2017-09-15 MED ORDER — SIMETHICONE 80 MG PO CHEW
80.0000 mg | CHEWABLE_TABLET | ORAL | Status: DC | PRN
  Administered 2017-09-15: 80 mg via ORAL
  Filled 2017-09-15 (×2): qty 1

## 2017-09-15 MED ORDER — BISACODYL 10 MG RE SUPP: 10.0000 mg | Freq: Every day | RECTAL | Status: DC | PRN

## 2017-09-15 MED ORDER — METOCLOPRAMIDE HCL 10 MG PO TABS
10.0000 mg | ORAL_TABLET | Freq: Once | ORAL | Status: AC
  Administered 2017-09-16: 10 mg via ORAL
  Filled 2017-09-15: qty 1

## 2017-09-15 MED ORDER — LIDOCAINE HCL (PF) 1 % IJ SOLN
INTRAMUSCULAR | Status: AC
  Filled 2017-09-15: qty 30

## 2017-09-15 MED ORDER — DEXTROSE IN LACTATED RINGERS 5 % IV SOLN: INTRAVENOUS | Status: DC

## 2017-09-15 MED ORDER — COCONUT OIL OIL: 1.0000 "application " | TOPICAL_OIL | Status: DC | PRN

## 2017-09-15 MED ORDER — ACETAMINOPHEN 325 MG PO TABS: 650.0000 mg | ORAL_TABLET | ORAL | Status: DC | PRN

## 2017-09-15 MED ORDER — LACTATED RINGERS IV SOLN: 500.0000 mL | Freq: Once | INTRAVENOUS | Status: DC

## 2017-09-15 MED ORDER — EPHEDRINE 5 MG/ML INJ
10.0000 mg | INTRAVENOUS | Status: DC | PRN
  Filled 2017-09-15: qty 2

## 2017-09-15 MED ORDER — ONDANSETRON HCL 4 MG PO TABS: 4.0000 mg | ORAL_TABLET | ORAL | Status: DC | PRN

## 2017-09-15 MED ORDER — SODIUM CHLORIDE 0.9 % IV SOLN
14.0000 mL/h | INTRAVENOUS | Status: DC | PRN
  Filled 2017-09-15 (×2): qty 17

## 2017-09-15 MED ORDER — BUPRENORPHINE HCL 8 MG SL SUBL: 8.0000 mg | SUBLINGUAL_TABLET | Freq: Every day | SUBLINGUAL | Status: DC

## 2017-09-15 MED ORDER — LIDOCAINE HCL (PF) 1 % IJ SOLN
INTRAMUSCULAR | Status: DC | PRN
  Administered 2017-09-15: 4 mL via EPIDURAL
  Administered 2017-09-15: 5 mL via EPIDURAL

## 2017-09-15 MED ORDER — NICOTINE 14 MG/24HR TD PT24
14.0000 mg | MEDICATED_PATCH | Freq: Every day | TRANSDERMAL | Status: DC
  Administered 2017-09-15 – 2017-09-18 (×3): 14 mg via TRANSDERMAL
  Filled 2017-09-15 (×5): qty 1

## 2017-09-15 MED ORDER — SOD CITRATE-CITRIC ACID 500-334 MG/5ML PO SOLN: 30.0000 mL | ORAL | Status: DC | PRN

## 2017-09-15 MED ORDER — OXYTOCIN 40 UNITS IN LACTATED RINGERS INFUSION - SIMPLE MED
2.5000 [IU]/h | INTRAVENOUS | Status: DC
  Administered 2017-09-15: 2.5 [IU]/h via INTRAVENOUS
  Filled 2017-09-15: qty 1000

## 2017-09-15 MED ORDER — ACETAMINOPHEN 325 MG PO TABS
650.0000 mg | ORAL_TABLET | ORAL | Status: DC | PRN
Start: 2017-09-15 — End: 2017-09-18
  Administered 2017-09-15: 650 mg via ORAL
  Filled 2017-09-15: qty 2

## 2017-09-15 MED ORDER — FAMOTIDINE 20 MG PO TABS
40.0000 mg | ORAL_TABLET | Freq: Once | ORAL | Status: AC
  Administered 2017-09-16: 40 mg via ORAL
  Filled 2017-09-15: qty 2

## 2017-09-15 MED ORDER — OXYCODONE-ACETAMINOPHEN 5-325 MG PO TABS
2.0000 | ORAL_TABLET | ORAL | Status: DC | PRN
Start: 2017-09-15 — End: 2017-09-15

## 2017-09-15 MED ORDER — ONDANSETRON HCL 4 MG/2ML IJ SOLN: 4.0000 mg | INTRAMUSCULAR | Status: DC | PRN

## 2017-09-15 MED ORDER — SODIUM CHLORIDE 0.9 % IV SOLN: 250.0000 mL | INTRAVENOUS | Status: DC | PRN

## 2017-09-15 MED ORDER — FLEET ENEMA 7-19 GM/118ML RE ENEM: 1.0000 | ENEMA | Freq: Every day | RECTAL | Status: DC | PRN

## 2017-09-15 MED ORDER — SODIUM CHLORIDE 0.9 % IV SOLN
14.0000 mL/h | INTRAVENOUS | Status: DC
  Filled 2017-09-15: qty 12.5

## 2017-09-15 MED ORDER — ONDANSETRON HCL 4 MG/2ML IJ SOLN
4.0000 mg | Freq: Four times a day (QID) | INTRAMUSCULAR | Status: DC | PRN
  Administered 2017-09-15: 4 mg via INTRAVENOUS
  Filled 2017-09-15: qty 2

## 2017-09-15 MED ORDER — DIPHENHYDRAMINE HCL 50 MG/ML IJ SOLN: 12.5000 mg | INTRAMUSCULAR | Status: DC | PRN

## 2017-09-15 NOTE — Anesthesia Preprocedure Evaluation (Signed)
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)  marijuana use,   Endo/Other   Obesity   Renal/GU negative Renal ROS  negative genitourinary   Musculoskeletal negative musculoskeletal ROS (+)   Abdominal   Peds  Hematology negative hematology ROS (+)   Anesthesia Other Findings HSV  Reproductive/Obstetrics (+) Pregnancy  PCOS                              Anesthesia Physical Anesthesia Plan  ASA: II  Anesthesia Plan: Epidural   Post-op Pain Management:    Induction:   PONV Risk Score and Plan: 2 and Treatment may vary due to age or medical condition  Airway Management Planned: Natural Airway  Additional Equipment: None  Intra-op Plan:   Post-operative Plan:   Informed Consent: I have reviewed the patients History and Physical, chart, labs and discussed the procedure including the risks, benefits and alternatives for the proposed anesthesia with the patient or authorized representative who has indicated his/her understanding and acceptance.     Plan Discussed with: Anesthesiologist  Anesthesia Plan Comments: (Labs reviewed. Platelets acceptable, patient not taking any blood thinning medications. Per RN, FHR tracing reported to be stable enough for sitting procedure. Risks and benefits discussed  with patient, including PDPH, backache, epidural hematoma, failed epidural, allergic reaction, and nerve injury. Patient expressed understanding and wished to proceed.)        Anesthesia Quick Evaluation

## 2017-09-15 NOTE — MAU Note (Signed)
Pt here with c/o contractions; unsure if leaking fluid. Reports good fetal movement. Denies any bleeding.

## 2017-09-15 NOTE — Anesthesia Postprocedure Evaluation (Signed)
Anesthesia Post Note  Patient: Ashley Levy  Procedure(s) Performed: AN AD HOC LABOR EPIDURAL     Patient location during evaluation: Mother Baby Anesthesia Type: Epidural Level of consciousness: awake and alert and oriented Pain management: satisfactory to patient Vital Signs Assessment: post-procedure vital signs reviewed and stable Respiratory status: spontaneous breathing and nonlabored ventilation Cardiovascular status: stable Postop Assessment: no headache, no backache, no signs of nausea or vomiting, adequate PO intake and patient able to bend at knees (patient up walking) Anesthetic complications: no    Last Vitals:  Vitals:   09/15/17 1209 09/15/17 1329  BP: 121/72 118/70  Pulse: 75 68  Resp: 18 20  Temp: 36.8 C 36.9 C  SpO2:      Last Pain:  Vitals:   09/15/17 1329  TempSrc: Oral  PainSc: 10-Worst pain ever   Pain Goal: Patients Stated Pain Goal: 5 (09/15/17 0823)               Madison HickmanGREGORY,Kenyette Gundy

## 2017-09-15 NOTE — Progress Notes (Signed)
Ashley Levy is a 32 y.o. G2P1001 at 4445w6d admitted for labor and SROM.  Subjective: Patient currently nauseous. Cervical check performed.  Objective: BP 97/64   Pulse 70   Temp 97.7 F (36.5 C) (Oral)   Resp 18   Ht 5\' 1"  (1.549 m)   Wt 78.9 kg (174 lb)   LMP  (LMP Unknown)   SpO2 99%   BMI 32.88 kg/m  No intake/output data recorded. No intake/output data recorded.  FHT:  FHR: 145 bpm, variability: moderate,  accelerations:  Present,  decelerations:  Absent UC:   regular, every 4-5 minutes SVE:   Dilation: 9 Effacement (%): 80 Station: -2 Exam by:: Dr. Truddie Cocoell  Labs: Lab Results  Component Value Date   WBC 14.7 (H) 09/15/2017   HGB 11.3 (L) 09/15/2017   HCT 33.5 (L) 09/15/2017   MCV 89.3 09/15/2017   PLT 156 09/15/2017    Assessment / Plan: Spontaneous labor, progressing normally. Will give zofran for nausea.  Labor: Progressing normally Preeclampsia:  no signs or symptoms of toxicity Fetal Wellbeing:  Category I ROM: SROM 2030 8/4 at home, clear Pain Control:  Labor support without medications and Epidural I/D:  n/a Anticipated MOD:  NSVD   Ashley SchatzPatricia Revecca Nachtigal, DO Family Medicine, PGY-3

## 2017-09-15 NOTE — Anesthesia Procedure Notes (Signed)
Epidural Patient location during procedure: OB Start time: 09/15/2017 6:00 AM End time: 09/15/2017 6:03 AM  Staffing Anesthesiologist: Beryle LatheBrock, Thomas E, MD Performed: anesthesiologist   Preanesthetic Checklist Completed: patient identified, pre-op evaluation, timeout performed, IV checked, risks and benefits discussed and monitors and equipment checked  Epidural Patient position: sitting Prep: DuraPrep Patient monitoring: continuous pulse ox and blood pressure Approach: midline Location: L2-L3 Injection technique: LOR saline  Needle:  Needle type: Tuohy  Needle gauge: 17 G Needle length: 9 cm Needle insertion depth: 5 cm Catheter size: 19 Gauge Catheter at skin depth: 10 cm Test dose: negative and Other (1% lidocaine)  Additional Notes Patient identified. Risks including, but not limited to, bleeding, infection, nerve damage, paralysis, inadequate analgesia, blood pressure changes, nausea, vomiting, allergic reaction, postpartum back pain, itching, and headache were discussed. Patient expressed understanding and wished to proceed. Sterile prep and drape, including hand hygiene, mask, and sterile gloves were used. The patient was positioned and the spine was prepped. The skin was anesthetized with lidocaine. No paraesthesia or other complication noted. The patient did not experience any signs of intravascular injection such as tinnitus or metallic taste in mouth, nor signs of intrathecal spread such as rapid motor block. Please see nursing notes for vital signs. The patient tolerated the procedure well.   Leslye Peerhomas Brock, MDReason for block:procedure for pain

## 2017-09-15 NOTE — H&P (Signed)
LABOR AND DELIVERY ADMISSION HISTORY AND PHYSICAL NOTE  Ashley Levy is a 32 y.o. female G2P1001 with IUP at [redacted]w[redacted]d by 1st trimester [place] at 13wk presenting for SOL and SROM @2030  on 8/4- clear fluid. She reports positive fetal movement. She denies vaginal bleeding.  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 Contraceptive management 08/02/2013  . Encounter for Nexplanon removal 08/02/2013  . History of postpartum depression 07/07/2014  . HSV-2 infection   . Polycystic ovarian syndrome   . Postpartum depression 07/07/2014  . Pregnant and not yet delivered     Past Surgical History: Past Surgical History:  Procedure Laterality Date  . NO PAST SURGERIES    . OPEN REDUCTION INTERNAL FIXATION (ORIF) PROXIMAL PHALANX Left 06/17/2015   Procedure: OPEN REDUCTION INTERNAL FIXATION (ORIF) LEFT SMALL FINGER PROXIMAL PHALANX FRACTURE;  Surgeon: Dominica Severin, MD;  Location: [facility] OR;  Service: Orthopedics;  Laterality: Left;    Obstetrical History: OB History    Gravida  2   Para  1   Term  1   Preterm      AB      Living  1     SAB      TAB      Ectopic      Multiple  0   Live Births  1           Social History: Social History   Socioeconomic History  . Marital status: Divorced    Spouse name: Not on file  . Number of children: Not on file  . Years of education: Not on file  . Highest education level: Not on file  Occupational History  . Not on file  Social Needs  . Financial resource strain: Not on file  . Food insecurity:    Worry: Not on file    Inability: Not on file  . Transportation needs:    Medical: Not on file    Non-medical: Not on file  Tobacco Use  . Smoking status: Current Every Day Smoker    Packs/day: 1.00     Years: 6.00    Pack years: 6.00    Types: Cigarettes  . Smokeless tobacco: Never Used  Substance and Sexual Activity  . Alcohol use: No    Frequency: Never    Comment: occasionally  . Drug use: Yes    Types: Marijuana    Comment: helps with nausea; "every now and then", last used a couple days ago per pt.  . Sexual activity: Yes    Birth control/protection: None  Lifestyle  . Physical activity:    Days per week: Not on file    Minutes per session: Not on file  . Stress: Not on file  Relationships  . Social connections:    Talks on phone: Not on file    Gets together: Not on file    Attends religious service: Not on file    Active member of club or organization: Not on file    Attends meetings of clubs or organizations: Not on file    Relationship status: Not on file  Other Topics Concern  . Not on file  Social History Narrative  . Not on file    Family History: Family History  Problem Relation Age of Onset  . Asthma Mother   . Cancer Paternal Grandfather   . Psoriasis Maternal Grandmother   . Asthma Maternal Grandfather   . Heart disease Father   . Heart attack Father     Allergies: No Known Allergies  Medications Prior to Admission  Medication Sig Dispense Refill Last Dose  . acetaminophen (TYLENOL) 325 MG tablet Take 650 mg by mouth every 6 (six) hours as needed.   Past Week at Unknown time  . acyclovir (ZOVIRAX) 400 MG tablet Take 1 tablet (400 mg total) by mouth 3 (three) times daily. 90 tablet 3 09/14/2017 at Unknown time  . buprenorphine (SUBUTEX) 8 MG SUBL SL tablet Place under the tongue daily.   09/14/2017 at 2100  . Docusate Sodium (COLACE PO) Take by mouth as needed.   Past Week at Unknown time  . pantoprazole (PROTONIX) 20 MG tablet Take 1 tablet (20 mg total) by mouth daily. 30 tablet 6 09/14/2017 at Unknown time  . Pediatric Multivit-Minerals-C (FLINTSTONES COMPLETE PO) Take 2 tablets by mouth daily.   09/14/2017 at Unknown time  . promethazine (PHENERGAN)  25 MG tablet Take 1 tablet (25 mg total) by mouth every 6 (six) hours as needed for nausea or vomiting. 15 tablet 1 Past Week at Unknown time     Review of Systems  All systems reviewed and negative except as stated in HPI  Physical Exam Blood pressure (!) 103/56, pulse 72, temperature 97.9 F (36.6 C), temperature source Oral, resp. rate 18, height 5\' 1"  (1.549 m), weight 174 lb (78.9 kg), SpO2 100 %. General appearance: alert, cooperative and no distress Lungs: clear to auscultation bilaterally Heart: regular rate and rhythm Abdomen: soft, non-tender; bowel sounds normal Extremities: No calf swelling or tenderness Presentation: cephalic by cervical examination  Fetal monitoring: 125/ moderate/ +accels/ early decelerations  Uterine activity: 2-4/ moderate by palpation  Dilation: 5 Effacement (%): 60 Station: -2 Exam by:: Latricia Heft RN  Prenatal labs: ABO, Rh: --/--/A POS (08/05 0458) Antibody: NEG (08/05 0458) Rubella: 0.93 (02/22 1126) RPR: Non Reactive (07/03 0848)  HBsAg: Negative (02/22 1126)  HIV: Non Reactive (07/03 0848)  GC/Chlamydia: Negative (7/18) GBS: Negative (07/18 1645)  2 hr Glucola: 73-159-130 (7/3) Genetic screening:  Negative  Anatomy US: Normal female, EFW 24%  [facility] Results  Initiated care at 16wk Pap   04/04/17: ASCUS w/ -HRHPV  Dating by 1st trimester U/S 13wk GC/CT Initial:   -/-                 36wks:     Neg/neg  Support Person [employer] NT/IT: DSR 1:130            AFP:  n/a               NIPS: neg  Flu vaccine   CF:  neg                     SMA:                Sickle Cell:  Tdap vaccine Recommended ~28wks 08/11/17 at [facility] Blood type    A+               Rhogam:     Antibody    neg  Anatomy US Normal female, EFW 24%  HIV  neg  Circumcision Yes, at [facility]                                              RPR   neg  Feeding Preference bottle HBsAg   neg  Pediatrician [facility] Rubella  equivocal   Contraception BTL, consent 07/18/17 2hr GTT    Early:                  26-28wks:    73/159/130  Prenatal Classes declined GBS  neg (For PCN allergy, check sensitivities)negative    Prenatal Transfer Tool  Maternal Diabetes: No Genetic Screening: Normal Maternal Ultrasounds/Referrals: Normal Fetal Ultrasounds or other Referrals:  None Maternal Substance Abuse:  Yes:  Type: Smoker, Marijuana, Other: Subutex  Significant Maternal Medications:  None Significant Maternal Lab Results: Lab values include: Group B Strep negative  Results for orders placed or performed during the hospital encounter of 09/15/17 (from the past 24 hour(s))  Fern Test   Collection Time: 09/15/17  4:27 AM  Result Value Ref Range   POCT Fern Test Positive = ruptured amniotic membanes (A)   Urine rapid drug screen (hosp performed)   Collection Time: 09/15/17  4:45 AM  Result Value Ref Range   Opiates NONE DETECTED NONE DETECTED   Cocaine NONE DETECTED NONE DETECTED   Benzodiazepines NONE DETECTED NONE DETECTED   Amphetamines NONE DETECTED NONE DETECTED   Tetrahydrocannabinol NONE DETECTED NONE DETECTED   Barbiturates NONE DETECTED NONE DETECTED  CBC   Collection Time: 09/15/17  4:58 AM  Result Value Ref Range   WBC 14.7 (H) 4.0 - 10.5 K/uL   RBC 3.75 (L) 3.87 - 5.11 MIL/uL   Hemoglobin 11.3 (L) 12.0 - 15.0 g/dL   HCT 08.633.5 (L) 57.836.0 - 46.946.0 %   MCV 89.3 78.0 - 100.0 fL   MCH 30.1 26.0 - 34.0 pg   MCHC 33.7 30.0 - 36.0 g/dL   RDW 62.913.8 52.811.5 - 41.315.5 %   Platelets 156 150 - 400 K/uL  Type and screen [facility]   Collection Time: 09/15/17  4:58 AM  Result Value Ref Range   ABO/RH(D) A POS    Antibody Screen NEG    Sample Expiration      09/18/2017 Performed at [facility], [address] [place], [place] [address]     Patient Active Problem List   Diagnosis Date Noted  . Normal labor 09/15/2017  . Pregnancy complicated by subutex maintenance, antepartum (HCC) 09/04/2017  .  Limited prenatal care 08/11/2017  . Rubella non-immune status, antepartum 04/10/2017  . Abnormal chromosomal and genetic finding on antenatal screening mother 04/08/2017  . Abnormal Pap smear of cervix 04/08/2017  . Supervision of normal pregnancy 04/04/2017  . Marijuana use 04/04/2017  . Opiate addiction (HCC) 04/04/2017  . History of postpartum depression 07/07/2014  . Smoker 11/01/2013  . Seropositive for herpes simplex 2 infection 01/06/2013    Assessment: Ashley PeoplesCasey Levy is a 32 y.o. G2P1001 at 5131w6d here for SOL and SROM @2030  on 8/4  #Labor: Progressing well, expectant management  #Pain: Plans epidural  #FWB: Cat I  #ID:  GBS neg  #MOF: Formula  #MOC:BTL consent signed 07/18/17 #Circ[facility]  [facility]  Seng Fouts, Rudean CurtVeronica C, CNM 09/15/2017, 6:34 AM

## 2017-09-16 ENCOUNTER — Encounter (HOSPITAL_COMMUNITY)
Admission: AD | Disposition: A | Discharge status: Home / Self Care | Payer: Self-pay | Source: Home / Self Care | Attending: Obstetrics and Gynecology

## 2017-09-16 ENCOUNTER — Encounter (HOSPITAL_COMMUNITY): Payer: Self-pay

## 2017-09-16 ENCOUNTER — Encounter (HOSPITAL_COMMUNITY): Payer: Self-pay | Admitting: Family Medicine

## 2017-09-16 ENCOUNTER — Other Ambulatory Visit: Payer: Self-pay

## 2017-09-16 ENCOUNTER — Encounter: Payer: Medicaid Other | Admitting: Women's Health

## 2017-09-16 ENCOUNTER — Inpatient Hospital Stay (HOSPITAL_COMMUNITY): Payer: Medicaid Other | Admitting: Anesthesiology

## 2017-09-16 DIAGNOSIS — Z302 Encounter for sterilization: Secondary | ICD-10-CM

## 2017-09-16 HISTORY — PX: TUBAL LIGATION: SHX77

## 2017-09-16 SURGERY — LIGATION, FALLOPIAN TUBE, POSTPARTUM
Anesthesia: Choice | Laterality: Bilateral

## 2017-09-16 MED ORDER — DEXAMETHASONE SODIUM PHOSPHATE 4 MG/ML IJ SOLN
INTRAMUSCULAR | Status: AC
  Filled 2017-09-16: qty 1

## 2017-09-16 MED ORDER — PROPOFOL 10 MG/ML IV BOLUS
INTRAVENOUS | Status: DC | PRN
  Administered 2017-09-16: 120 mg via INTRAVENOUS

## 2017-09-16 MED ORDER — ONDANSETRON HCL 4 MG/2ML IJ SOLN
INTRAMUSCULAR | Status: DC | PRN
  Administered 2017-09-16: 4 mg via INTRAVENOUS

## 2017-09-16 MED ORDER — FENTANYL CITRATE (PF) 100 MCG/2ML IJ SOLN
INTRAMUSCULAR | Status: DC | PRN
  Administered 2017-09-16: 150 ug via INTRAVENOUS

## 2017-09-16 MED ORDER — ACETAMINOPHEN 10 MG/ML IV SOLN
INTRAVENOUS | Status: AC
  Filled 2017-09-16: qty 100

## 2017-09-16 MED ORDER — HYDROCODONE-ACETAMINOPHEN 7.5-325 MG PO TABS: 1.0000 | ORAL_TABLET | Freq: Once | ORAL | Status: DC | PRN

## 2017-09-16 MED ORDER — LIDOCAINE HCL (CARDIAC) PF 100 MG/5ML IV SOSY
PREFILLED_SYRINGE | INTRAVENOUS | Status: AC
  Filled 2017-09-16: qty 5

## 2017-09-16 MED ORDER — MIDAZOLAM HCL 2 MG/2ML IJ SOLN
INTRAMUSCULAR | Status: AC
  Filled 2017-09-16: qty 2

## 2017-09-16 MED ORDER — HYDROMORPHONE HCL 1 MG/ML IJ SOLN
0.2500 mg | INTRAMUSCULAR | Status: DC | PRN
  Administered 2017-09-16: 0.5 mg via INTRAVENOUS

## 2017-09-16 MED ORDER — HYDROMORPHONE HCL 1 MG/ML IJ SOLN
INTRAMUSCULAR | Status: AC
  Filled 2017-09-16: qty 1

## 2017-09-16 MED ORDER — BUPIVACAINE HCL 0.5 % IJ SOLN
INTRAMUSCULAR | Status: DC | PRN
  Administered 2017-09-16: 30 mL

## 2017-09-16 MED ORDER — KETAMINE HCL 10 MG/ML IJ SOLN
INTRAMUSCULAR | Status: DC | PRN
  Administered 2017-09-16: 40 mg via INTRAVENOUS
  Administered 2017-09-16: 10 mg via INTRAVENOUS

## 2017-09-16 MED ORDER — KETAMINE HCL 10 MG/ML IJ SOLN
INTRAMUSCULAR | Status: AC
  Filled 2017-09-16: qty 1

## 2017-09-16 MED ORDER — BUPIVACAINE HCL (PF) 0.5 % IJ SOLN
INTRAMUSCULAR | Status: AC
  Filled 2017-09-16: qty 30

## 2017-09-16 MED ORDER — LIDOCAINE HCL (CARDIAC) PF 100 MG/5ML IV SOSY
PREFILLED_SYRINGE | INTRAVENOUS | Status: DC | PRN
  Administered 2017-09-16: 100 mg via INTRAVENOUS

## 2017-09-16 MED ORDER — PROPOFOL 10 MG/ML IV BOLUS
INTRAVENOUS | Status: AC
  Filled 2017-09-16: qty 20

## 2017-09-16 MED ORDER — PROMETHAZINE HCL 25 MG/ML IJ SOLN
6.2500 mg | INTRAMUSCULAR | Status: DC | PRN
  Administered 2017-09-16: 6.25 mg via INTRAVENOUS

## 2017-09-16 MED ORDER — ONDANSETRON HCL 4 MG/2ML IJ SOLN
INTRAMUSCULAR | Status: AC
  Filled 2017-09-16: qty 2

## 2017-09-16 MED ORDER — LACTATED RINGERS IV SOLN
INTRAVENOUS | Status: DC | PRN
  Administered 2017-09-16 (×2): via INTRAVENOUS

## 2017-09-16 MED ORDER — SUCCINYLCHOLINE CHLORIDE 20 MG/ML IJ SOLN
INTRAMUSCULAR | Status: DC | PRN
  Administered 2017-09-16: 80 mg via INTRAVENOUS

## 2017-09-16 MED ORDER — PROMETHAZINE HCL 25 MG/ML IJ SOLN
INTRAMUSCULAR | Status: AC
  Filled 2017-09-16: qty 1

## 2017-09-16 MED ORDER — FENTANYL CITRATE (PF) 250 MCG/5ML IJ SOLN
INTRAMUSCULAR | Status: AC
  Filled 2017-09-16: qty 5

## 2017-09-16 MED ORDER — MIDAZOLAM HCL 2 MG/2ML IJ SOLN
INTRAMUSCULAR | Status: DC | PRN
  Administered 2017-09-16: 2 mg via INTRAVENOUS

## 2017-09-16 MED ORDER — MEPERIDINE HCL 25 MG/ML IJ SOLN: 6.2500 mg | INTRAMUSCULAR | Status: DC | PRN

## 2017-09-16 MED ORDER — DEXAMETHASONE SODIUM PHOSPHATE 10 MG/ML IJ SOLN
INTRAMUSCULAR | Status: DC | PRN
  Administered 2017-09-16: 10 mg via INTRAVENOUS

## 2017-09-16 MED ORDER — ACETAMINOPHEN 10 MG/ML IV SOLN
1000.0000 mg | Freq: Once | INTRAVENOUS | Status: AC | PRN
  Administered 2017-09-16: 1000 mg via INTRAVENOUS
  Filled 2017-09-16: qty 100

## 2017-09-16 SURGICAL SUPPLY — 31 items
ADH SKN CLS APL DERMABOND .7 (GAUZE/BANDAGES/DRESSINGS) ×1
CLOTH BEACON ORANGE TIMEOUT ST (SAFETY) ×2 IMPLANT
DERMABOND ADVANCED (GAUZE/BANDAGES/DRESSINGS) ×1
DERMABOND ADVANCED .7 DNX12 (GAUZE/BANDAGES/DRESSINGS) ×1 IMPLANT
DRSG OPSITE 4X5.5 SM (GAUZE/BANDAGES/DRESSINGS) ×1 IMPLANT
DRSG OPSITE POSTOP 3X4 (GAUZE/BANDAGES/DRESSINGS) ×2 IMPLANT
DURAPREP 26ML APPLICATOR (WOUND CARE) ×2 IMPLANT
ELECT REM PT RETURN 9FT ADLT (ELECTROSURGICAL) ×2
ELECTRODE REM PT RTRN 9FT ADLT (ELECTROSURGICAL) ×1 IMPLANT
GLOVE BIO SURGEON STRL SZ7 (GLOVE) ×2 IMPLANT
GLOVE BIOGEL PI IND STRL 7.0 (GLOVE) ×1 IMPLANT
GLOVE BIOGEL PI IND STRL 7.5 (GLOVE) ×1 IMPLANT
GLOVE BIOGEL PI INDICATOR 7.0 (GLOVE) ×1
GLOVE BIOGEL PI INDICATOR 7.5 (GLOVE) ×1
GOWN STRL REUS W/TWL LRG LVL3 (GOWN DISPOSABLE) ×4 IMPLANT
NEEDLE HYPO 22GX1.5 SAFETY (NEEDLE) ×2 IMPLANT
NS IRRIG 1000ML POUR BTL (IV SOLUTION) ×2 IMPLANT
PACK ABDOMINAL MINOR (CUSTOM PROCEDURE TRAY) ×2 IMPLANT
PENCIL BUTTON HOLSTER BLD 10FT (ELECTRODE) IMPLANT
PROTECTOR NERVE ULNAR (MISCELLANEOUS) ×2 IMPLANT
SPONGE LAP 4X18 X RAY DECT (DISPOSABLE) ×2 IMPLANT
SUT MNCRL AB 4-0 PS2 18 (SUTURE) ×2 IMPLANT
SUT MON AB 4-0 PS1 27 (SUTURE) ×1 IMPLANT
SUT PLAIN 0 NONE (SUTURE) ×1 IMPLANT
SUT PLAIN 2 0 (SUTURE)
SUT PLAIN ABS 2-0 CT1 27XMFL (SUTURE) IMPLANT
SUT VICRYL 0 TIES 12 18 (SUTURE) IMPLANT
SUT VICRYL 0 UR6 27IN ABS (SUTURE) ×2 IMPLANT
SYR CONTROL 10ML LL (SYRINGE) ×2 IMPLANT
TOWEL OR 17X24 6PK STRL BLUE (TOWEL DISPOSABLE) ×4 IMPLANT
TRAY FOLEY CATH SILVER 14FR (SET/KITS/TRAYS/PACK) ×2 IMPLANT

## 2017-09-16 NOTE — Anesthesia Postprocedure Evaluation (Signed)
Anesthesia Post Note  Patient: Ashley PeoplesCasey Levy  Procedure(s) Performed: POST PARTUM TUBAL LIGATION (Bilateral )     Patient location during evaluation: PACU Anesthesia Type: General Level of consciousness: awake and alert Pain management: pain level controlled Vital Signs Assessment: post-procedure vital signs reviewed and stable Respiratory status: spontaneous breathing, nonlabored ventilation, respiratory function stable and patient connected to nasal cannula oxygen Cardiovascular status: blood pressure returned to baseline and stable Postop Assessment: no apparent nausea or vomiting Anesthetic complications: no    Last Vitals:  Vitals:   09/16/17 1200 09/16/17 1215  BP: (!) 148/90 121/87  Pulse: 92 81  Resp: (!) 23 20  Temp:    SpO2: (!) 86% 90%    Last Pain:  Vitals:   09/16/17 1215  TempSrc:   PainSc: 0-No pain   Pain Goal: Patients Stated Pain Goal: 5 (09/15/17 0823)               Trevor IhaStephen A Houser

## 2017-09-16 NOTE — Progress Notes (Signed)
Pt hit the back of her head in the OR when coming out of anesthesia.  Pt hit the vent circuit christmas tree and bent it.  RN and Dr. Adrian BlackwaterStinson assessed head.  No harm noted.

## 2017-09-16 NOTE — Progress Notes (Signed)
Subjective: Postpartum Day 1:VAVD Patient reports + flatus and no problems voiding. Reports generalized abdominal tenderness and back pain.  Objective: Vital signs in last 24 hours: Temp:  [97.6 F (36.4 C)-98.6 F (37 C)] 97.9 F (36.6 C) (08/06 0634) Pulse Rate:  [66-99] 68 (08/06 0634) Resp:  [16-20] 20 (08/06 0634) BP: (91-121)/(49-75) 109/69 (08/06 0634) SpO2:  [99 %-100 %] 100 % (08/05 2000)  Physical Exam:  General: alert and cooperative Lochia: appropriate Uterine Fundus: firm Incision: healing well, no significant drainage, no significant erythema DVT Evaluation: No significant calf/ankle edema.  Recent Labs    09/15/17 0458  HGB 11.3*  HCT 33.5*    Assessment/Plan: Status post VAVD and PP BTL. Doing well postoperatively.  Continue current care.  Ashley Levy 09/16/2017, 7:26 AM   OB FELLOW MEDICAL STUDENT NOTE ATTESTATION  I confirm that I have verified the information documented in the medical student's note and that I have also personally performed the physical exam and all medical decision making activities.   Ashley PeoplesCasey Levy is 32 y.o. [phone] female PPD #1 from VAVD  with history of postpartum depression and subutex use. PP BTL completed this morning.  Patient reports feeling well.  + flatus. Vital signs stable. Physical exam benign with firm uterine fundus and no LE edema. Incision is clean/dry/intact. Patient is bottle feeding. Currently on Subutex. Plan for d/c tomorrow. SW consult is pending for h/o postpartum depression and Edinburgh score of 16.   [name], D.O. OB Fellow  09/16/2017, 11:15 AM

## 2017-09-16 NOTE — Progress Notes (Signed)
Patient ID: Ashley PeoplesCasey Applegate, female   DOB: 07/26/1985, 32 y.o.   MRN: [id number]  Risks of procedure discussed with patient including but not limited to: risk of regret, permanence of method, bleeding, infection, injury to surrounding organs and need for additional procedures.  Failure risk of 1 -2 % with increased risk of ectopic gestation if pregnancy occurs was also discussed with patient.    Levie HeritageStinson, Jacob J, DO 09/16/2017 8:35 AM

## 2017-09-16 NOTE — Anesthesia Procedure Notes (Signed)
Procedure Name: Intubation Date/Time: 09/16/2017 8:50 AM Performed by: Hewitt Blade, CRNA Pre-anesthesia Checklist: Patient identified, Emergency Drugs available, Suction available and Patient being monitored Patient Re-evaluated:Patient Re-evaluated prior to induction Oxygen Delivery Method: Circle system utilized Preoxygenation: Pre-oxygenation with 100% oxygen Induction Type: IV induction, Rapid sequence and Cricoid Pressure applied Laryngoscope Size: Mac and 3 Grade View: Grade I Tube type: Oral Tube size: 7.0 mm Number of attempts: 1 Airway Equipment and Method: Stylet Placement Confirmation: ETT inserted through vocal cords under direct vision,  positive ETCO2 and breath sounds checked- equal and bilateral Secured at: 21 cm Tube secured with: Tape Dental Injury: Teeth and Oropharynx as per pre-operative assessment

## 2017-09-16 NOTE — Anesthesia Preprocedure Evaluation (Addendum)
Anesthesia Evaluation  Patient identified by MRN, date of birth, ID band Patient awake    Reviewed: Allergy & Precautions, NPO status , Patient's Chart, lab work & pertinent test results  Airway Mallampati: II  TM Distance: >3 FB Neck ROM: Full    Dental no notable dental hx. (+) Poor Dentition   Pulmonary Current Smoker,    Pulmonary exam normal breath sounds clear to auscultation       Cardiovascular Exercise Tolerance: Good negative cardio ROS Normal cardiovascular exam Rhythm:Regular Rate:Normal     Neuro/Psych PSYCHIATRIC DISORDERS negative neurological ROS     GI/Hepatic negative GI ROS, (+)     substance abuse  marijuana use, On suboxone.   Endo/Other  negative endocrine ROS  Renal/GU negative Renal ROS     Musculoskeletal   Abdominal   Peds  Hematology negative hematology ROS (+)   Anesthesia Other Findings   Reproductive/Obstetrics                                                             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)  marijuana use,   Endo/Other   Obesity   Renal/GU negative Renal ROS  negative genitourinary   Musculoskeletal negative musculoskeletal ROS (+)   Abdominal   Peds  Hematology negative hematology ROS (+)   Anesthesia Other  Findings HSV  Reproductive/Obstetrics (+) Pregnancy  PCOS                              Anesthesia Physical Anesthesia Plan  ASA: II  Anesthesia Plan: Epidural   Post-op Pain Management:    Induction:   PONV Risk Score and Plan: 2 and Treatment may vary due to age or medical condition  Airway Management Planned: Natural Airway  Additional Equipment: None  Intra-op Plan:   Post-operative Plan:   Informed Consent: I have reviewed the patients History and Physical, chart, labs and discussed the procedure including the risks, benefits and alternatives for the proposed anesthesia with the patient or authorized representative who has indicated his/her understanding and acceptance.     Plan Discussed with: Anesthesiologist  Anesthesia Plan Comments: (Labs reviewed. Platelets acceptable, patient not taking any blood thinning medications. Per RN, FHR tracing reported to be stable enough for sitting procedure. Risks and benefits discussed with patient, including PDPH, backache, epidural hematoma, failed epidural, allergic reaction, and nerve injury. Patient expressed understanding and wished to proceed.)        Anesthesia Quick Evaluation  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)  marijuana use,   Endo/Other    Obesity   Renal/GU negative Renal ROS  negative genitourinary   Musculoskeletal negative musculoskeletal ROS (+)   Abdominal   Peds  Hematology negative hematology ROS (+)   Anesthesia Other Findings HSV  Reproductive/Obstetrics (+) Pregnancy  PCOS                              Anesthesia Physical Anesthesia Plan  ASA: II  Anesthesia Plan: Epidural   Post-op Pain Management:    Induction:   PONV Risk Score and Plan: 2 and Treatment may vary due to age or medical condition  Airway Management Planned: Natural Airway  Additional Equipment: None  Intra-op Plan:   Post-operative Plan:   Informed Consent: I have reviewed the patients History and Physical, chart, labs and discussed the procedure including the risks, benefits and alternatives for the proposed anesthesia with the patient or authorized representative who has indicated his/her understanding and acceptance.     Plan Discussed with: Anesthesiologist  Anesthesia Plan Comments: (Labs reviewed. Platelets acceptable, patient not taking any blood thinning medications. Per RN, FHR tracing reported to be stable enough for sitting procedure. Risks and benefits discussed with patient, including PDPH, backache, epidural hematoma, failed epidural, allergic reaction, and nerve injury. Patient expressed understanding and wished to proceed.)        Anesthesia Quick Evaluation  Anesthesia Physical Anesthesia Plan  ASA: III  Anesthesia Plan: General   Post-op Pain Management:    Induction: Intravenous  PONV Risk Score and Plan: 0 and Treatment may vary due to age or medical condition, Ondansetron and Dexamethasone  Airway Management Planned: Oral ETT  Additional Equipment:   Intra-op Plan:   Post-operative Plan: Extubation in OR  Informed Consent: I have reviewed the patients History and Physical, chart, labs and discussed the procedure including the risks, benefits and  alternatives for the proposed anesthesia with the patient or authorized representative who has indicated his/her understanding and acceptance.   Dental advisory given  Plan Discussed with: CRNA  Anesthesia Plan Comments:         Lab Results  Component Value Date   WBC 14.7 (H) 09/15/2017   HGB 11.3 (L) 09/15/2017   HCT 33.5 (L) 09/15/2017   MCV 89.3 09/15/2017   PLT 156 09/15/2017    Anesthesia Quick Evaluation

## 2017-09-16 NOTE — Transfer of Care (Signed)
Immediate Anesthesia Transfer of Care Note  Patient: Ashley Levy  Procedure(s) Performed: POST PARTUM TUBAL LIGATION (Bilateral )  Patient Location: PACU  Anesthesia Type:General  Level of Consciousness: awake, alert  and sedated  Airway & Oxygen Therapy: Patient Spontanous Breathing and Patient connected to face mask oxygen  Post-op Assessment: Report given to RN, Post -op Vital signs reviewed and stable and Patient moving all extremities  Post vital signs: Reviewed and stable  Last Vitals:  Vitals Value Taken Time  BP 132/87 09/16/2017 10:15 AM  Temp    Pulse 95 09/16/2017 10:23 AM  Resp 21 09/16/2017 10:23 AM  SpO2 83 % 09/16/2017 10:23 AM  Vitals shown include unvalidated device data.  Last Pain:  Vitals:   09/16/17 0753  TempSrc: Oral  PainSc:       Patients Stated Pain Goal: 5 (09/15/17 [id number])  Complications: No apparent anesthesia complications

## 2017-09-16 NOTE — Op Note (Signed)
Ashley PeoplesCasey Westerfeld 09/16/2017 9:55 AM  PREOPERATIVE DIAGNOSIS:  Undesired fertility  POSTOPERATIVE DIAGNOSIS:  Undesired fertility  PROCEDURE:  Postpartum Bilateral Tubal Sterilization using Pomeroy method   SURGEON: Surgeon(s) and Role:    * Levie HeritageStinson, Jacob J, DO - Primary    * Arvilla MarketWallace, Piotr Christopher Lauren, DO - Assisting -OB Fellow              ANESTHESIA:  Epidural  COMPLICATIONS:  None immediate.  ESTIMATED BLOOD LOSS:  Less than 20cc.  FLUIDS: 1000 cc LR.  URINE OUTPUT:  125 cc of clear urine.  Maintain hydration by drinking small amounts of clear fluids frequently, then soft diet, and then advance diet as tolerated. May use OTC Imodium if desired for any diarrhea.  Call if symptoms worsen, high fever, severe weakness or fainting, increased abdominal pain, blood in stool or vomit, or failure to improve in 2-3 days.   INDICATIONS: 32 y.o. yo [id number]  with undesired fertility,status post vaginal delivery, desires permanent sterilization. Risks and benefits of procedure discussed with patient including permanence of method, bleeding, infection, injury to surrounding organs and need for additional procedures. Risk failure of 0.5-1% with increased risk of ectopic gestation if pregnancy occurs was also discussed with patient.   FINDINGS:  Normal uterus, tubes, and ovaries.  TECHNIQUE: After informed consent was obtained, the patient was taken to the operating room where anesthesia was induced and found to be adequate. A small transverse, infraumbilical skin incision was made with the scalpel. This incision was carried down to the underlying layer of fascia. The fascia was grasped with Kocher clamps tented up and entered sharply with Mayo scissors. Underlying peritoneum was then identified tented up and entered sharply with Metzenbaum scissors. The fascia was tagged with 0 Vicryl. The patient's right fallopian tube was then identified, brought to the incision, and grasped with a Babcock clamp. The  tube was then followed out to the fimbria. The Babcock clamp was then used to grasp the tube approximately 4 cm from the cornual region. A 3 cm segment of the tube was then ligated with free tie of plain gut suture, transected and excised. Good hemostasis was noted and the tube was returned to the abdomen. The left fallopian tube was then identified to its fimbriated end, ligated, and a 3 cm segment excised in a similar fashion. Excellent hemostasis was noted, and the tube returned to the abdomen. The fascia was re-approximated with 0 Vicryl.  30 cc of quarter percent Marcaine solution was then injected at the incision site. The skin was closed in a subcuticular fashion with Monocryl.The patient tolerated the procedure well. Sponge, lap, and needle count were correct x2. The patient was taken to recovery room in stable condition.  Marcy Sirenatherine Xue Low, D.O. OB Fellow  09/16/2017, 9:55 AM

## 2017-09-17 DIAGNOSIS — Z3A39 39 weeks gestation of pregnancy: Secondary | ICD-10-CM

## 2017-09-17 MED ORDER — IBUPROFEN 600 MG PO TABS: 600.0000 mg | ORAL_TABLET | Freq: Four times a day (QID) | ORAL | 0 refills | Status: DC

## 2017-09-17 MED ORDER — MEASLES, MUMPS & RUBELLA VAC ~~LOC~~ INJ
0.5000 mL | INJECTION | Freq: Once | SUBCUTANEOUS | Status: DC
  Filled 2017-09-17: qty 0.5

## 2017-09-17 NOTE — Discharge Instructions (Signed)
Vaginal Delivery, Care After °Refer to this sheet in the next few weeks. These instructions provide you with information about caring for yourself after vaginal delivery. Your health care provider may also give you more specific instructions. Your treatment has been planned according to current medical practices, but problems sometimes occur. Call your health care provider if you have any problems or questions. °What can I expect after the procedure? °After vaginal delivery, it is common to have: °· Some bleeding from your vagina. °· Soreness in your abdomen, your vagina, and the area of skin between your vaginal opening and your anus (perineum). °· Pelvic cramps. °· Fatigue. ° °Follow these instructions at home: °Medicines °· Take over-the-counter and prescription medicines only as told by your health care provider. °· If you were prescribed an antibiotic medicine, take it as told by your health care provider. Do not stop taking the antibiotic until it is finished. °Driving ° °· Do not drive or operate heavy machinery while taking prescription pain medicine. °· Do not drive for 24 hours if you received a sedative. °Lifestyle °· Do not drink alcohol. This is especially important if you are breastfeeding or taking medicine to relieve pain. °· Do not use tobacco products, including cigarettes, chewing tobacco, or e-cigarettes. If you need help quitting, ask your health care provider. °Eating and drinking °· Drink at least 8 eight-ounce glasses of water every day unless you are told not to by your health care provider. If you choose to breastfeed your baby, you may need to drink more water than this. °· Eat high-fiber foods every day. These foods may help prevent or relieve constipation. High-fiber foods include: °? Whole grain cereals and breads. °? Brown rice. °? Beans. °? Fresh fruits and vegetables. °Activity °· Return to your normal activities as told by your health care provider. Ask your health care provider  what activities are safe for you. °· Rest as much as possible. Try to rest or take a nap when your baby is sleeping. °· Do not lift anything that is heavier than your baby or 10 lb (4.5 kg) until your health care provider says that it is safe. °· Talk with your health care provider about when you can engage in sexual activity. This may depend on your: °? Risk of infection. °? Rate of healing. °? Comfort and desire to engage in sexual activity. °Vaginal Care °· If you have an episiotomy or a vaginal tear, check the area every day for signs of infection. Check for: °? More redness, swelling, or pain. °? More fluid or blood. °? Warmth. °? Pus or a bad smell. °· Do not use tampons or douches until your health care provider says this is safe. °· Watch for any blood clots that may pass from your vagina. These may look like clumps of dark red, brown, or black discharge. °General instructions °· Keep your perineum clean and dry as told by your health care provider. °· Wear loose, comfortable clothing. °· Wipe from front to back when you use the toilet. °· Ask your health care provider if you can shower or take a bath. If you had an episiotomy or a perineal tear during labor and delivery, your health care provider may tell you not to take baths for a certain length of time. °· Wear a bra that supports your breasts and fits you well. °· If possible, have someone help you with household activities and help care for your baby for at least a few days after   you leave the hospital. °· Keep all follow-up visits for you and your baby as told by your health care provider. This is important. °Contact a health care provider if: °· You have: °? Vaginal discharge that has a bad smell. °? Difficulty urinating. °? Pain when urinating. °? A sudden increase or decrease in the frequency of your bowel movements. °? More redness, swelling, or pain around your episiotomy or vaginal tear. °? More fluid or blood coming from your episiotomy or  vaginal tear. °? Pus or a bad smell coming from your episiotomy or vaginal tear. °? A fever. °? A rash. °? Little or no interest in activities you used to enjoy. °? Questions about caring for yourself or your baby. °· Your episiotomy or vaginal tear feels warm to the touch. °· Your episiotomy or vaginal tear is separating or does not appear to be healing. °· Your breasts are painful, hard, or turn red. °· You feel unusually sad or worried. °· You feel nauseous or you vomit. °· You pass large blood clots from your vagina. If you pass a blood clot from your vagina, save it to show to your health care provider. Do not flush blood clots down the toilet without having your health care provider look at them. °· You urinate more than usual. °· You are dizzy or light-headed. °· You have not breastfed at all and you have not had a menstrual period for 12 weeks after delivery. °· You have stopped breastfeeding and you have not had a menstrual period for 12 weeks after you stopped breastfeeding. °Get help right away if: °· You have: °? Pain that does not go away or does not get better with medicine. °? Chest pain. °? Difficulty breathing. °? Blurred vision or spots in your vision. °? Thoughts about hurting yourself or your baby. °· You develop pain in your abdomen or in one of your legs. °· You develop a severe headache. °· You faint. °· You bleed from your vagina so much that you fill two sanitary pads in one hour. °This information is not intended to replace advice given to you by your health care provider. Make sure you discuss any questions you have with your health care provider. °Document Released: 01/26/2000 Document Revised: 07/12/2015 Document Reviewed: 02/12/2015 °Elsevier Interactive Patient Education © 2018 Elsevier Inc[employer] ° °

## 2017-09-17 NOTE — Progress Notes (Signed)
Post Partum Day 2  Subjective: Patient is doing fair today. Seen by Dr. Frederic Jerichoan Olson this morning and was tearful and in a good deal of pain at that time. She was complaining of bilateral leg, hip and abdominal pain. I went to patient's room multiple times during the day, but she was no present in the room. Returned to her room around 1645 and patient in bed. She states she is "just overwhelmed" and very upset. She is upset that her Subutex prescription cannot be filled prior to discharge. She is unsure how she can get a prescription since she missed her weekly appointment with her home provider [place] because she was in labor. She [facility] not [facility] because of pain in her legs. She feels like there is also swelling in her right hip. Her abdomen feels full of fluid. She denies any chest pain or shortness of breath.   Objective: Blood pressure 122/78, pulse 84, temperature 97.9 F (36.6 C), temperature source Oral, resp. rate 19, height 5\' 1"  (1.549 m), weight 78.9 kg (174 lb), SpO2 98 %, unknown if currently breastfeeding.  Physical Exam:  General: tearful, sitting up in bed, slow to move lower extremities Lochia: appropriate Uterine Fundus: firm Incision: healing well, dressing with minimal serosanguinous drainage otherwise C/D/I DVT Evaluation: bilateral 2+ pitting edema to knees, diffuse LE TTP with positive but distractable Homan's sign  TTP worse on R > LLE Psych: denies SI/HI, tangential thoughts, tearful  Recent Labs    09/15/17 0458  HGB 11.3*  HCT 33.5*   Assessment/Plan: Patient is a 31yo [id number] who is PPD#2 from a SVD after presenting in labor. 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 She was started on subutex towards the end of her pregnancy. Her infant will remain in the hospital until DOL5 for monitoring. Patient originally was to be discharged home  today. She was seen later in the day because she was outside of her room several times during the day when providers came to see her. Upon discussion of discharge, patient became anxious, overwhelmed and tearful. She stated that she was told a provider here would be able to write a prescription for her to bridge her until her next Subutex appointment with her home provider, Dr [place]athe[place] [phone]). Unfortunately, there is not currently a provider available who can write this prescription for her. I called her home provider, and the nurse was able to send a page to Dr. Cathey EndowBowen to see if her prescription could be filled without an appointment. She missed her weekly Subutex appointment because she was here in the hospital in labor. I told Dr. Ovidio KinBowen's nurse that the patient requested that the Subutex prescription be [facility] to [facility] [place]. I then called the pharmacy and confirmed that the patient's family member (mother or sister) would be able to pick up the prescription for her. The patient was understandably upset given the uncertainty in obtaining her prescription prior to discharge.   Given her history of postpartum depression, opioid dependence, and not having certain access to her Subutex, the decision was made to defer discharge until tomorrow in hopes of being able to meet with social work, help obtain access to her medication, and ensure safe discharge planning.   Additionally, given bilateral lower extremity swelling and significant pain, would continue to monitor for signs of DVT. Low suspicion at this time and thought to be  related to fluid retention.    LOS: 2 days   Tamera Stands, DO 09/17/2017, 6:09 PM

## 2017-09-17 NOTE — Plan of Care (Signed)
Patient progressing well through shift.  

## 2017-09-17 NOTE — Discharge Summary (Signed)
OB Discharge Summary     Patient Name: Ashley PeoplesCasey Levy DOB: 22-Jun-1985 MRN: [id number]  Date of admission: 09/15/2017 Delivering MD: Candis SchatzELL, PATRICIA   Date of discharge: 09/17/2017  Admitting diagnosis: 40 WEEKS CTX Intrauterine pregnancy: 5540w6d     Secondary diagnosis:  Active Problems:   Normal labor  Additional problems: drug abuse     Discharge diagnosis: Term Pregnancy Delivered and , drug abuase                                                                                                Post partum procedures:postpartum tubal ligation  Augmentation: none  Complications: None  Hospital course:  Onset of Labor With Vaginal Delivery     32 y.o. yo [id number] at 4540w6d was admitted in Active Labor on 09/15/2017. Patient had an uncomplicated labor course as follows:  Membrane Rupture Time/Date: 8:30 PM ,09/14/2017   Intrapartum Procedures: Episiotomy: None [1]                                         Lacerations:  Periurethral [8];1st degree [2]  Patient had a delivery of a Viable infant. 09/15/2017  Information for the patient's newborn:  Deno EtienneMoore, Boy Shelsey [[id number]][[id number]]  Delivery Method: Vaginal, Vacuum (Extractor)(Filed from Delivery Summary)    Pateint had an uncomplicated postpartum course.  She is ambulating, tolerating a regular diet, passing flatus, and urinating well. Patient is discharged home in stable condition on 09/17/17.   Physical exam  Vitals:   09/16/17 1615 09/16/17 2102 09/16/17 2317 09/17/17 0505  BP: 118/81 121/75 114/78 114/73  Pulse: 77 73 76 66  Resp: 20 17 18 17   Temp: 98.3 F (36.8 C) 98.3 F (36.8 C)    TempSrc: Oral Oral    SpO2:  100% 99% 100%  Weight:      Height:       General: alert, cooperative and no distress Lochia: appropriate Uterine Fundus: firm Incision: Dressing is clean, dry, and intact DVT Evaluation: No evidence of DVT seen on physical exam. Calf/Ankle edema is present Labs: Lab Results  Component Value Date   WBC 14.7 (H)  09/15/2017   HGB 11.3 (L) 09/15/2017   HCT 33.5 (L) 09/15/2017   MCV 89.3 09/15/2017   PLT 156 09/15/2017   CMP Latest Ref Rng & Units 08/10/2017  Glucose 70 - 99 mg/dL 97  BUN 6 - 20 mg/dL 5(L)  Creatinine 1.300.44 - 1.00 mg/dL 8.650.49  Sodium 784135 - 696145 mmol/L 134(L)  Potassium 3.5 - 5.1 mmol/L 3.6  Chloride 98 - 111 mmol/L 106  CO2 22 - 32 mmol/L 22  Calcium 8.9 - 10.3 mg/dL 8.3(L)  Total Protein 6.5 - 8.1 g/dL 6.4(L)  Total Bilirubin 0.3 - 1.2 mg/dL 0.4  Alkaline Phos 38 - 126 U/L 121  AST 15 - 41 U/L 18  ALT 0 - 44 U/L 20    Discharge instruction: per After Visit Summary and "Baby and Me Booklet".  After visit meds:  Allergies as of 09/17/2017  No Known Allergies     Medication List    STOP taking these medications   acetaminophen 325 MG tablet Commonly known as:  TYLENOL   acyclovir 400 MG tablet Commonly known as:  ZOVIRAX   COLACE PO   FLINTSTONES COMPLETE PO   pantoprazole 20 MG tablet Commonly known as:  PROTONIX   promethazine 25 MG tablet Commonly known as:  PHENERGAN     TAKE these medications   buprenorphine 8 MG Subl SL tablet Commonly known as:  SUBUTEX Place 4 mg under the tongue 3 (three) times daily.   ibuprofen 600 MG tablet Commonly known as:  ADVIL,MOTRIN Take 1 tablet (600 mg total) by mouth every 6 (six) hours.       Diet: routine diet  Activity: Advance as tolerated. Pelvic rest for 6 weeks.   Outpatient follow up:4 weeks Follow up Appt: Future Appointments  Date Time Provider Department Center  10/24/2017 11:00 AM Cheral Marker, CNM [facility]   Follow up Visit:No follow-ups on file.  Postpartum contraception: Tubal Ligation  Newborn Data: Live born female  Birth Weight: 8 lb 3 oz (3714 g) APGAR: 9, 9  Newborn Delivery   Birth date/time:  09/15/2017 10:21:00 Delivery type:  Vaginal, Vacuum (Extractor)     Baby Feeding: Bottle Disposition:rooming in. Being monitored for 5 days in hospital     09/17/2017 Sandre Kitty, MD

## 2017-09-18 MED ORDER — FUROSEMIDE 40 MG PO TABS
40.0000 mg | ORAL_TABLET | Freq: Two times a day (BID) | ORAL | Status: AC
  Administered 2017-09-18 (×2): 40 mg via ORAL
  Filled 2017-09-18 (×2): qty 1

## 2017-09-18 MED ORDER — FUROSEMIDE 40 MG PO TABS: 40.0000 mg | ORAL_TABLET | Freq: Every day | ORAL | 0 refills | Status: AC

## 2017-09-18 NOTE — Progress Notes (Signed)
CSW acknowledges consult and completed clinical assessment.  Clinical documentation will follow.  There are no barriers to d/c.  Keylen Eckenrode Boyd-Gilyard, MSW, LCSW Clinical Social Work [phone]   

## 2017-09-18 NOTE — Discharge Summary (Addendum)
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 yo [id number] at 302w6d was admitted in Latent Labor on 09/15/2017. Patient had an uncomplicated labor course as follows:  Membrane Rupture Time/Date: 8:30 PM ,09/14/2017   Intrapartum Procedures: Episiotomy: None [1]                                         Lacerations:  Periurethral [8];1st degree [2]  Patient had a delivery of a Viable infant. 09/15/2017  Information for the patient's newborn:  Ashley Levy, Boy Lynsie [[id number]][[id number]]  Delivery Method: Vaginal, Vacuum (Extractor)(Filed from Delivery Summary)    Pateint had an uncomplicated postpartum course.  She is ambulating, tolerating a regular diet, passing flatus, and urinating well. Patient is discharged home in stable condition on 09/18/17.   Physical exam  Vitals:   09/17/17 0505 09/17/17 1520 09/17/17 2341 09/18/17 0600  BP: 114/73 122/78 121/76 118/74  Pulse: 66 84 79 79  Resp: 17 19 20 18   Temp:  97.9 F (36.6 C) (!) 97.4 F (36.3 C) 98.9 F (37.2 C)  TempSrc:  Oral Oral Oral  SpO2: 100% 98%    Weight:      Height:       General: alert and cooperative.  Lochia: appropriate Uterine Fundus: firm Incision: Dressing is clean, dry, and intact DVT Evaluation: No evidence of DVT seen on physical exam. Calf/Ankle edema is present. 1+ pitting edema on lower legs bilaterally.   Legs are slightly less swollen than earlier this evening. Extremely tender to palpation and light touch.   Abdomen: midline abdomen below the umbilicus is edematous and tender to light palpation.   Labs: Lab Results  Component Value Date   WBC 14.7 (H) 09/15/2017   HGB 11.3 (L) 09/15/2017   HCT 33.5 (L) 09/15/2017   MCV 89.3 09/15/2017   PLT 156 09/15/2017   CMP Latest Ref Rng & Units 08/10/2017  Glucose 70 - 99 mg/dL 97  BUN 6 - 20 mg/dL 5(L)  Creatinine 1.300.44 - 1.00 mg/dL 8.650.49  Sodium 784135 - 696145 mmol/L 134(L)  Potassium 3.5 - 5.1 mmol/L 3.6  Chloride 98 - 111 mmol/L 106  CO2 22 - 32 mmol/L 22  Calcium 8.9 - 10.3 mg/dL 8.3(L)  Total Protein 6.5 - 8.1 g/dL 6.4(L)  Total Bilirubin 0.3 - 1.2 mg/dL 0.4  Alkaline Phos 38 - 126 U/L 121  AST 15 - 41 U/L 18  ALT 0 - 44 U/L 20    Discharge instruction: per After Visit Summary and "Baby and Me Booklet".  After visit meds:  Allergies as of 09/18/2017   No Known Allergies     Medication List    STOP taking these medications   acetaminophen 325 MG tablet Commonly known as:  TYLENOL   acyclovir 400 MG tablet Commonly known as:  ZOVIRAX   COLACE PO   FLINTSTONES COMPLETE PO   pantoprazole 20 MG tablet Commonly known as:  PROTONIX   promethazine 25 MG tablet Commonly known as:  PHENERGAN     TAKE these medications   buprenorphine 8 MG Subl SL tablet Commonly known as:  SUBUTEX Place 4 mg under the tongue 3 (three) times daily.   ibuprofen 600 MG tablet Commonly known as:  ADVIL,MOTRIN Take 1 tablet (600 mg total) by mouth every 6 (six) hours.       Diet: routine diet  Activity: Advance as tolerated. Pelvic rest for 6 weeks.   Outpatient follow up:n/a Follow up Appt: Future Appointments  Date Time Provider Department Center  10/24/2017 11:00 AM Cheral Marker, CNM [facility]   Follow up Visit:No follow-ups on file.  Postpartum contraception: Tubal Ligation  Newborn Data: Live born female  Birth  Weight: 8 lb 3 oz (3714 g) APGAR: 9, 9  Newborn Delivery   Birth date/time:  09/15/2017 10:21:00 Delivery type:  Vaginal, Vacuum (Extractor)     Baby Feeding: Bottle Disposition:rooming in. Staying for observation until Saturday.    Dr. Rhett Bannister spoke yesterday with the office of the provider who treats patient for opioid use disorder and the nurse states the patient can likely receive a prescription today for subutex and pick it up at [facility] in [place].     09/18/2017 Sandre Kitty, MD   OB FELLOW DISCHARGE ATTESTATION  I have seen and examined this patient and agree with above documentation in the resident's note.   Additionally, patient with 1+ pitting edema to her knees bilaterally. Reports the edema is painful mostly across the top of her right foot, the medial side of her right leg, and the lateral side of her left thigh. She denies pain with ambulation. She has no erythema, increased warmth, or palpable cords on exam. Edema is symmetric and Homan's sign is negative bilaterally. Patient is sensitive to light palpation all over her lower extremities. Suspect this is dependent edema from IVFs and lack of mobility from SVD and PP BTL. Have counseled patients on signs of DVT and PE. Per documentation, patient's edema has improved with Lasix 40 mg PO. Patient endorses good urination with diuretic. Another dose is ordered for this AM. Will give prior to d/c. Will d/c with 3 days of Lasix 40 mg. Patient is not breast feeding so not concerned about potential for diuretic to interfere with breast milk supply. Encouraged compression, elevation of feet.   Marcy Siren, D.O. OB Fellow  09/18/2017, 3:13 PM

## 2017-09-18 NOTE — Progress Notes (Signed)
Patient ID: Ashley PeoplesCasey Levy, female   DOB: 1985-12-30, 32 y.o.   MRN: [id number]  Ashley FlesherWent to patient's room after nurse was concerned about patient swelling and tenderness.  Patient's swelling is similar to this morning.  It has not improved.  Still 1+ pitting edema bilateraly up to the knees w/ tenderness to palpation bilaterally.  Patient abdomen is still enlarged.  Patient abdomen more edematous along midline than lateral. Tenderness to palpation is also more midline and below the umbilicus.    Ordered 40mg  PO lasix now and additional dose for 8am.

## 2017-09-22 NOTE — Clinical Social Work Maternal (Signed)
CLINICAL SOCIAL WORK MATERNAL/CHILD NOTE  Patient Details  Name: Ashley Levy MRN: [id number] Date of Birth: 01/05/1986  Date:  17-Nov-2017  Clinical Social Worker Initiating Note:  Ashley Levy Date/Time: Initiated:  09/18/17/1545     Child's Name:  Ashley Levy   Biological Parents:  Mother, Father   Need for Interpreter:  None   Reason for Referral:  Current Substance Use/Substance Use During Pregnancy , Late or No Prenatal Care    Address:  [address] [place] [address]    Phone number:  [phone] (home)     Additional phone number:   Household Members/Support Persons (HM/SP):   Household Member/Support Person 1, Household Member/Support Person 2   HM/SP Name Relationship DOB or Age  HM/SP -83 Ashley Levy  daughter  05/22/2014  HM/SP -2 Ashley Levy FOB 08/13/1970  HM/SP -3        HM/SP -4        HM/SP -5        HM/SP -6        HM/SP -7        HM/SP -8          Natural Supports (not living in the home):  Extended Family, Immediate Family   Professional Supports: Therapist(MOB receives SA services with Dr. Deon Levy)   Employment: Unemployed   Type of Work:     Education:  Nurse, adult   Homebound arranged:    Museum/gallery curator Resources:  [employer]   Other Resources:  [employer], Physicist, medical    Cultural/Religious Considerations Which May Impact Care:  Per [employer], MOB is Engineer, manufacturing.   Strengths:  Ability to meet basic needs , Home prepared for child , Pediatrician chosen   Psychotropic Medications:         Pediatrician:    [facility]  Pediatrician List:   [facility]      Pediatrician Fax Number:    Risk Factors/Current Problems:  Substance Use , Mental Health Concerns    Cognitive State:  Able to Concentrate , Alert , Linear Thinking    Mood/Affect:  Calm , Comfortable , Relaxed ,  Interested    CSW Assessment: CSW met with MOB to complete an assessment for SA hx, limited PNC, and MH hx.  MOB was polite and receptive to meeting with CSW.   CSW asked about MOB's SA hx and MOB openly shared that MOB is currently taking Subutex for pain pill addiction. MOB reported being in compliance with prescription and program guidelines with the exception of utilizing marijuana 1-2 times during the pregnancy nausea.  CSW explained hospital SA policy and MOB Was understanding. MOB is aware that CSW will monitor infant's CDS and will make a report to [facility] CPS if warranted. MOB denied barriers to follow-up appointments.  CSW asked about MOB's MH hx and MOB shared that MOB's MH is also being treated at Dr. Thedora Levy office. MOB shared PPD symptoms after the birth of MOB's oldest child. CSW provided education regarding the baby blues period vs. perinatal mood disorders, discussed treatment and gave resources for mental health follow up if concerns arise.  CSW recommends self-evaluation during the postpartum time period using the New Mom Checklist from Postpartum Progress and encouraged MOB to contact a medical professional if symptoms are noted at any time.  CSW assessed for safety and MOB denied SI and HI.  CSW also reviewed MOB's Edinburgh score and MOB attributed her feelings and responses due to having to stay in the hospital for 5 days to monitor infant for NAS.  MOB reported having a good support team and having all essential items needed for parenting.   CSW provided review of Sudden Infant Death Syndrome (SIDS) precautions.     CSW Plan/Description:  No Further Intervention Required/No Barriers to Discharge, Sudden Infant Death Syndrome (SIDS) Education, Perinatal Mood and Anxiety Disorder (PMADs) Education, Neonatal Abstinence Syndrome (NAS) Education, [place], Other Information/Referral to [employer], CSW Will Continue to Monitor Umbilical Cord  Tissue Drug Screen Results and Make Report if Warranted   Ashley Levy, MSW, LCSW Clinical Social Work [phone]  Ashley Nanas, LCSW 09/22/2017, 3:53 PM

## 2017-09-23 ENCOUNTER — Telehealth: Payer: Self-pay | Admitting: *Deleted

## 2017-09-23 NOTE — Telephone Encounter (Signed)
Patient states she still has dressing over incision from tubal and asking when to remove. Informed patient she could take a shower and remove dressing. If steri strips over incision, to leave in place.  Verbalized understanding.

## 2017-10-15 ENCOUNTER — Encounter: Payer: Self-pay | Admitting: Obstetrics and Gynecology

## 2017-10-15 ENCOUNTER — Other Ambulatory Visit: Payer: Self-pay

## 2017-10-15 ENCOUNTER — Ambulatory Visit (INDEPENDENT_AMBULATORY_CARE_PROVIDER_SITE_OTHER): Payer: Medicaid Other | Admitting: Obstetrics and Gynecology

## 2017-10-15 DIAGNOSIS — F53 Postpartum depression: Secondary | ICD-10-CM

## 2017-10-15 DIAGNOSIS — F439 Reaction to severe stress, unspecified: Secondary | ICD-10-CM | POA: Diagnosis not present

## 2017-10-15 NOTE — Progress Notes (Signed)
Patient ID: Ashley Levy, female   DOB: 1985/08/07, 32 y.o.   MRN: [id number]    [facility] Clinic Visit  @DATE @            Patient name: Ashley Levy MRN [id number]  Date of birth: Sep 19, 1985  CC & HPI:  Ashley Levy is a 32 y.o. female presenting today for dealing with post-partum. The baby's father is present but does not help with the baby. This is the fathers 4th child and is 16 years older than Ashley Levy. This is Ashley Levy's second child and has a 65 yr old. Ashley Levy's mother helps more out with 26 yr old daughter than her son. She says she can't talk to the father and he does not understand and works most days. If she tries to show the father how to do something he takes it as her being condescending. Refuses to watch children, and does not help around the house when he gets home from work. He turns every conversation into an argument and leaves leaving Ashley Levy to deal with emotional stress.   ROS:  ROS -fever -chills All systems are negative except as noted in the HPI and PMH.   Pertinent History Reviewed:   Reviewed: Medical         Past Medical History:  Diagnosis Date  . Contraceptive management 08/02/2013  . Encounter for Nexplanon removal 08/02/2013  . History of postpartum depression 07/07/2014  . HSV-2 infection   . Polycystic ovarian syndrome   . Postpartum depression 07/07/2014  . Pregnant and not yet delivered                               Surgical Hx:    Past Surgical History:  Procedure Laterality Date  . NO PAST SURGERIES    . OPEN REDUCTION INTERNAL FIXATION (ORIF) PROXIMAL PHALANX Left 06/17/2015   Procedure: OPEN REDUCTION INTERNAL FIXATION (ORIF) LEFT SMALL FINGER PROXIMAL PHALANX FRACTURE;  Surgeon: Dominica Severin, MD;  Location: [facility] OR;  Service: Orthopedics;  Laterality: Left;  . TUBAL LIGATION Bilateral 09/16/2017   Procedure: POST PARTUM TUBAL LIGATION;  Surgeon: Ashley Heritage, DO;  Location: [facility];  Service: Gynecology;  Laterality: Bilateral;    Medications: Reviewed & Updated - see associated section                       Current Outpatient Medications:  .  buprenorphine (SUBUTEX) 8 MG SUBL SL tablet, Place 4 mg under the tongue 4 (four) times daily. , Disp: , Rfl:  .  ibuprofen (ADVIL,MOTRIN) 600 MG tablet, Take 1 tablet (600 mg total) by mouth every 6 (six) hours., Disp: 30 tablet, Rfl: 0 .  furosemide (LASIX) 40 MG tablet, Take 1 tablet (40 mg total) by mouth daily for 3 days., Disp: 3 tablet, Rfl: 0   Social History: Reviewed -  reports that she has been smoking cigarettes. She has a 6.00 pack-year smoking history. She has never used smokeless tobacco.  Objective Findings:  Vitals: Blood pressure 105/60, pulse 75, height 5' 1.5" (1.562 m), weight 141 lb (64 kg), not currently breastfeeding.  PHYSICAL EXAMINATION General appearance - alert, well appearing, and in no distress, oriented to person, place, and time and anxious Mental status - alert, oriented to person, place, and time, normal mood, behavior, speech, dress, motor activity, and thought processes, anxious  PELVIC NOT DONE  Assessment & Plan:   A:  1.  Post-partum depression with situational stressors due to relationship 2. Rx Lexapro, 1 tablet a day  P:  1.  F/u 2 weeks discussed communication strategies, using " we" language,  2    Efforts to validate importance of cooperation between partners emphasized, though limitations of changing a 32 yr old female acknowledged.   By signing my name below, I, Arnette Norris, attest that this documentation has been prepared under the direction and in the presence of Tilda Burrow, MD. Electronically Signed: Arnette Norris Medical Scribe. 10/15/17. 11:57 AM.  I personally performed the services described in this documentation, which was SCRIBED in my presence. The recorded information has been reviewed and considered accurate. It has been edited as necessary during review. Tilda Burrow, MD

## 2017-10-17 ENCOUNTER — Other Ambulatory Visit: Payer: Self-pay | Admitting: *Deleted

## 2017-10-17 ENCOUNTER — Telehealth: Payer: Self-pay | Admitting: *Deleted

## 2017-10-17 MED ORDER — ESCITALOPRAM OXALATE 10 MG PO TABS: 10.0000 mg | ORAL_TABLET | Freq: Every day | ORAL | 11 refills | Status: DC

## 2017-10-17 NOTE — Progress Notes (Signed)
Patient informed medication was sent to pharmacy.

## 2017-10-24 ENCOUNTER — Encounter: Payer: Self-pay | Admitting: *Deleted

## 2017-10-24 ENCOUNTER — Ambulatory Visit: Payer: Medicaid Other | Admitting: Women's Health

## 2018-01-02 ENCOUNTER — Encounter (HOSPITAL_COMMUNITY): Payer: Self-pay

## 2018-01-02 ENCOUNTER — Other Ambulatory Visit: Payer: Self-pay

## 2018-01-02 ENCOUNTER — Emergency Department (HOSPITAL_COMMUNITY): Payer: Medicaid Other

## 2018-01-02 ENCOUNTER — Emergency Department (HOSPITAL_COMMUNITY)
Admission: EM | Admit: 2018-01-02 | Discharge: 2018-01-02 | Disposition: A | Discharge status: Home / Self Care | Payer: Medicaid Other | Attending: Emergency Medicine | Admitting: Emergency Medicine

## 2018-01-02 DIAGNOSIS — K2100 Gastro-esophageal reflux disease with esophagitis, without bleeding: Secondary | ICD-10-CM

## 2018-01-02 DIAGNOSIS — F17210 Nicotine dependence, cigarettes, uncomplicated: Secondary | ICD-10-CM | POA: Diagnosis not present

## 2018-01-02 DIAGNOSIS — F1290 Cannabis use, unspecified, uncomplicated: Secondary | ICD-10-CM | POA: Insufficient documentation

## 2018-01-02 DIAGNOSIS — F1120 Opioid dependence, uncomplicated: Secondary | ICD-10-CM | POA: Diagnosis not present

## 2018-01-02 DIAGNOSIS — Z79899 Other long term (current) drug therapy: Secondary | ICD-10-CM | POA: Diagnosis not present

## 2018-01-02 DIAGNOSIS — R079 Chest pain, unspecified: Secondary | ICD-10-CM | POA: Diagnosis present

## 2018-01-02 LAB — BASIC METABOLIC PANEL
Anion gap: 7 (ref 5–15)
BUN: 15 mg/dL (ref 6–20)
CO2: 25 mmol/L (ref 22–32)
CREATININE: 0.74 mg/dL (ref 0.44–1.00)
Calcium: 9.2 mg/dL (ref 8.9–10.3)
Chloride: 105 mmol/L (ref 98–111)
GFR calc Af Amer: >60 mL/min (ref >60)
GLUCOSE: 111 mg/dL — AB (ref 70–99)
Potassium: 3.8 mmol/L (ref 3.5–5.1)
Sodium: 137 mmol/L (ref 135–145)

## 2018-01-02 LAB — HCG, QUANTITATIVE, PREGNANCY: hCG, Beta Chain, Quant, S: <1 m[IU]/mL (ref <5)

## 2018-01-02 LAB — CBC
HCT: 43.7 % (ref 36.0–46.0)
Hemoglobin: 13.6 g/dL (ref 12.0–15.0)
MCH: 28.1 pg (ref 26.0–34.0)
MCHC: 31.1 g/dL (ref 30.0–36.0)
MCV: 90.3 fL (ref 80.0–100.0)
Platelets: 169 10*3/uL (ref 150–400)
RBC: 4.84 MIL/uL (ref 3.87–5.11)
RDW: 15.3 % (ref 11.5–15.5)
WBC: 7.8 10*3/uL (ref 4.0–10.5)
nRBC: 0 % (ref 0.0–0.2)

## 2018-01-02 LAB — TROPONIN I

## 2018-01-02 MED ORDER — ALUM & MAG HYDROXIDE-SIMETH 200-200-20 MG/5ML PO SUSP
30.0000 mL | Freq: Once | ORAL | Status: AC
Start: 2018-01-02 — End: 2018-01-02
  Administered 2018-01-02: 30 mL via ORAL
  Filled 2018-01-02: qty 30

## 2018-01-02 MED ORDER — PANTOPRAZOLE SODIUM 20 MG PO TBEC: 20.0000 mg | DELAYED_RELEASE_TABLET | Freq: Every day | ORAL | 0 refills | Status: AC

## 2018-01-02 MED ORDER — LIDOCAINE VISCOUS HCL 2 % MT SOLN
15.0000 mL | Freq: Once | OROMUCOSAL | Status: AC
  Administered 2018-01-02: 15 mL via ORAL
  Filled 2018-01-02: qty 15

## 2018-01-02 NOTE — ED Provider Notes (Signed)
[facility] EMERGENCY DEPARTMENT Provider Note   CSN: [id number] Arrival date & time: 01/02/18  1435     History   Chief Complaint Chief Complaint  Patient presents with  . Chest Pain    HPI Ashley Levy is a 32 y.o. female.  Patient complains of epigastric discomfort.  The history is provided by the patient. No language interpreter was used.  Chest Pain   This is a new problem. The current episode started 12 to 24 hours ago. The problem occurs constantly. The problem has not changed since onset.The pain is associated with rest. The pain is present in the substernal region. The pain is at a severity of 2/10. The pain is mild. The quality of the pain is described as burning. The pain does not radiate. Exacerbated by: Unknown. Pertinent negatives include no abdominal pain, no back pain, no cough and no headaches. She has tried nothing for the symptoms. The treatment provided no relief. There are no known risk factors.  Pertinent negatives for past medical history include no aneurysm and no seizures.    Past Medical History:  Diagnosis Date  . Contraceptive management 08/02/2013  . Encounter for Nexplanon removal 08/02/2013  . History of postpartum depression 07/07/2014  . HSV-2 infection   . Polycystic ovarian syndrome   . Postpartum depression 07/07/2014  . Pregnant and not yet delivered     Patient Active Problem List   Diagnosis Date Noted  . Normal labor 09/15/2017  . Pregnancy complicated by subutex maintenance, antepartum (HCC) 09/04/2017  . Limited prenatal care 08/11/2017  . Rubella non-immune status, antepartum 04/10/2017  . Abnormal chromosomal and genetic finding on antenatal screening mother 04/08/2017  . Abnormal Pap smear of cervix 04/08/2017  . Marijuana use 04/04/2017  . Opiate addiction (HCC) 04/04/2017  . History of postpartum depression 07/07/2014  . Smoker 11/01/2013  . Seropositive for herpes simplex 2 infection 01/06/2013    Past Surgical History:    Procedure Laterality Date  . NO PAST SURGERIES    . OPEN REDUCTION INTERNAL FIXATION (ORIF) PROXIMAL PHALANX Left 06/17/2015   Procedure: OPEN REDUCTION INTERNAL FIXATION (ORIF) LEFT SMALL FINGER PROXIMAL PHALANX FRACTURE;  Surgeon: Dominica Severin, MD;  Location: [facility];  Service: Orthopedics;  Laterality: Left;  . TUBAL LIGATION Bilateral 09/16/2017   Procedure: POST PARTUM TUBAL LIGATION;  Surgeon: Levie Heritage, DO;  Location: [facility];  Service: Gynecology;  Laterality: Bilateral;     OB History    Gravida  2   Para  2   Term  2   Preterm      AB      Living  2     SAB      TAB      Ectopic      Multiple  0   Live Births  2            Home Medications    Prior to Admission medications   Medication Sig Start Date End Date Taking? Authorizing Provider  buprenorphine (SUBUTEX) 8 MG SUBL SL tablet Place 4 mg under the tongue 4 (four) times daily.   Yes [provider]  escitalopram (LEXAPRO) 10 MG tablet Take 1 tablet (10 mg total) by mouth daily. Patient not taking: Reported on 01/02/2018 10/17/17   Tilda Burrow, MD  furosemide (LASIX) 40 MG tablet Take 1 tablet (40 mg total) by mouth daily for 3 days. 09/18/17 09/21/17  Arvilla Market, DO  pantoprazole (PROTONIX) 20 MG tablet Take  1 tablet (20 mg total) by mouth daily. 01/02/18   Bethann Berkshire, MD    Family History Family History  Problem Relation Age of Onset  . Asthma Mother   . Cancer Paternal Grandfather   . Psoriasis Maternal Grandmother   . Asthma Maternal Grandfather   . Heart disease Father   . Heart attack Father     Social History Social History   Tobacco Use  . Smoking status: Current Every Day Smoker    Packs/day: 1.00    Years: 6.00    Pack years: 6.00    Types: Cigarettes  . Smokeless tobacco: Never Used  Substance Use Topics  . Alcohol use: No    Frequency: Never    Comment: occasionally  . Drug use: Not Currently    Types: Marijuana    Comment:  helps with nausea; "every now and then", last used a couple days ago per pt.     Allergies   Patient has no known allergies.   Review of Systems Review of Systems  Constitutional: Negative for appetite change and fatigue.  HENT: Negative for congestion, ear discharge and sinus pressure.   Eyes: Negative for discharge.  Respiratory: Negative for cough.   Cardiovascular: Positive for chest pain.  Gastrointestinal: Negative for abdominal pain and diarrhea.  Genitourinary: Negative for frequency and hematuria.  Musculoskeletal: Negative for back pain.  Skin: Negative for rash.  Neurological: Negative for seizures and headaches.  Psychiatric/Behavioral: Negative for hallucinations.     Physical Exam Updated Vital Signs BP 90/71   Pulse 74   Temp (!) 97.5 F (36.4 C) (Oral)   Resp 18   Ht 5\' 1"  (1.549 m)   Wt 67.1 kg   SpO2 100%   BMI 27.96 kg/m   Physical Exam  Constitutional: She is oriented to person, place, and time. She appears well-developed.  HENT:  Head: Normocephalic.  Eyes: Conjunctivae and EOM are normal. No scleral icterus.  Neck: Neck supple. No thyromegaly present.  Cardiovascular: Normal rate and regular rhythm. Exam reveals no gallop and no friction rub.  No murmur heard. Pulmonary/Chest: No stridor. She has no wheezes. She has no rales. She exhibits no tenderness.  Abdominal: She exhibits no distension. There is tenderness. There is no rebound.  Tender epigastric  Musculoskeletal: Normal range of motion. She exhibits no edema.  Lymphadenopathy:    She has no cervical adenopathy.  Neurological: She is oriented to person, place, and time. She exhibits normal muscle tone. Coordination normal.  Skin: No rash noted. No erythema.  Psychiatric: She has a normal mood and affect. Her behavior is normal.     ED Treatments / Results  Labs (all labs ordered are listed, but only abnormal results are displayed) Labs Reviewed  BASIC METABOLIC PANEL - Abnormal;  Notable for the following components:      Result Value   Glucose, Bld 111 (*)    All other components within normal limits  CBC  TROPONIN I  HCG, QUANTITATIVE, PREGNANCY    EKG EKG Interpretation  Date/Time:  Friday January 02 2018 14:45:02 EST Ventricular Rate:  69 PR Interval:    QRS Duration: 90 QT Interval:  371 QTC Calculation: 398 R Axis:   77 Text Interpretation:  Sinus rhythm Confirmed by Bethann Berkshire [phone]) on 01/02/2018 3:17:08 PM   Radiology Dg Chest 2 View  Result Date: 01/02/2018 CLINICAL DATA:  Chest pain. EXAM: CHEST - 2 VIEW COMPARISON:  08/10/2017. FINDINGS: Mediastinum and hilar structures normal. Lungs are clear. No  pleural effusion or pneumothorax. Heart size normal. No acute bony abnormality identified. IMPRESSION: No acute cardiopulmonary disease. Electronically Signed   By: Maisie Fushomas  Register   On: 01/02/2018 15:36    Procedures Procedures (including critical care time)  Medications Ordered in ED Medications  alum & mag hydroxide-simeth (MAALOX/MYLANTA) 200-200-20 MG/5ML suspension 30 mL (30 mLs Oral Given 01/02/18 1617)    And  lidocaine (XYLOCAINE) 2 % viscous mouth solution 15 mL (15 mLs Oral Given 01/02/18 1617)     Initial Impression / Assessment and Plan / ED Course  I have reviewed the triage vital signs and the nursing notes.  Pertinent labs & imaging results that were available during my care of the patient were reviewed by me and considered in my medical decision making (see chart for details).    Patient symptoms improved with Maalox.  Labs EKG chest x-ray unremarkable.  Suspect GERD.  Patient will be placed on Protonix and follow-up with her PCP  Final Clinical Impressions(s) / ED Diagnoses   Final diagnoses:  Gastroesophageal reflux disease with esophagitis    ED Discharge Orders         Ordered    pantoprazole (PROTONIX) 20 MG tablet  Daily     01/02/18 1757           Bethann BerkshireZammit, Melika Reder, MD 01/02/18 1801

## 2018-01-02 NOTE — Discharge Instructions (Addendum)
follow-up with your family doctor in 2 to 3 weeks.  Return sooner if problems

## 2018-01-02 NOTE — ED Notes (Signed)
Patient transported to X-ray 

## 2018-01-02 NOTE — ED Triage Notes (Signed)
Pt is having central chest pain that is radiating into her back. Describes pain as pressure and stabbing. Started last night. Is concerned for pneumonia.

## 2018-04-15 NOTE — Telephone Encounter (Signed)
Note sent to nurse. 

## 2019-03-15 ENCOUNTER — Encounter: Payer: Self-pay | Admitting: Family Medicine

## 2020-02-18 IMAGING — DX DG CHEST 2V
2 series · 2 of 2 positions shown · non-contrast
Comparison: 08/10/2017.

CLINICAL DATA: Chest pain.

EXAM:
CHEST - 2 VIEW

[chest pa]
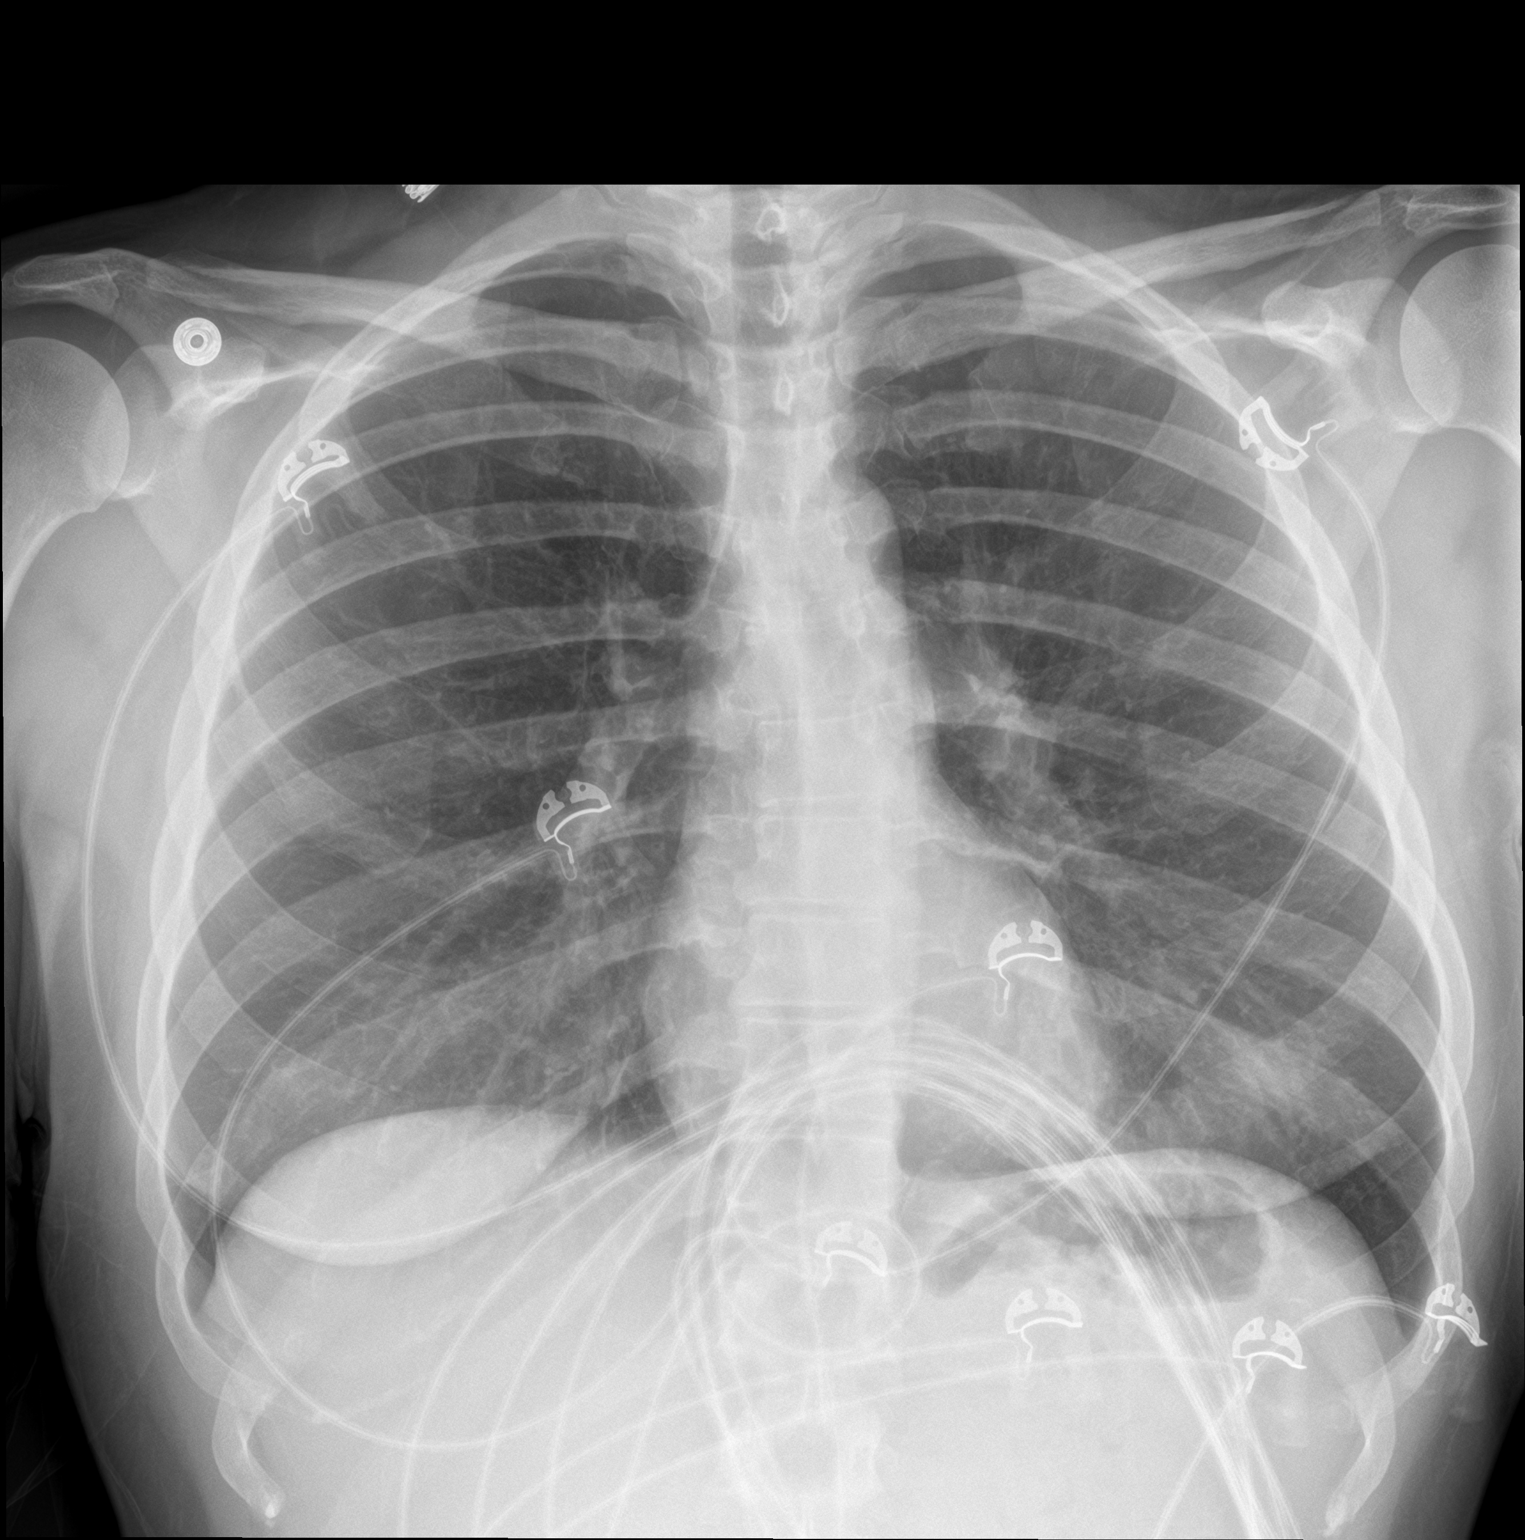

[chest lat]
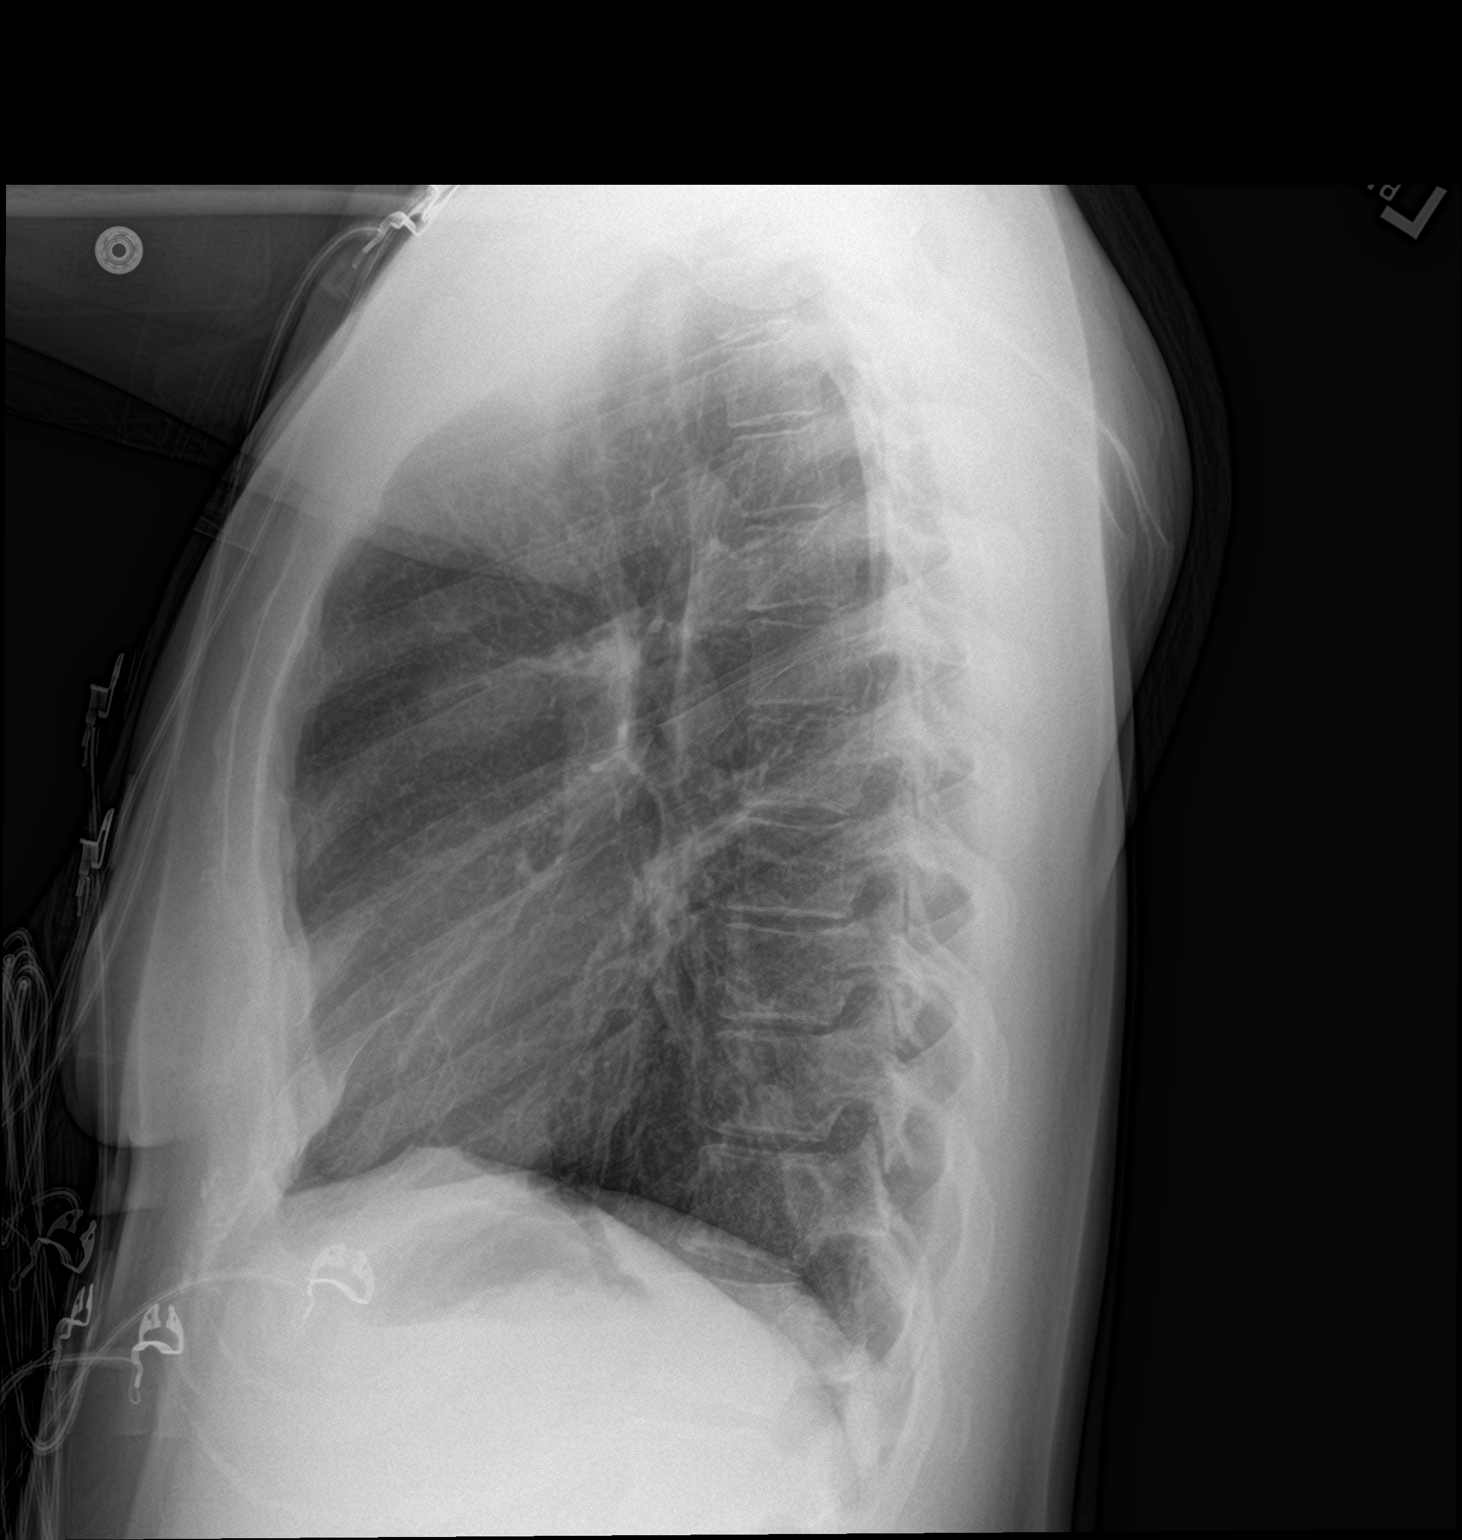

[2 of 2 positions shown; findings below may reference images not displayed]

FINDINGS: Mediastinum and hilar structures normal. Lungs are clear. No pleural
effusion or pneumothorax. Heart size normal. No acute bony
abnormality identified.
IMPRESSION: No acute cardiopulmonary disease.

## 2023-10-23 ENCOUNTER — Encounter: Admitting: Advanced Practice Midwife

## 2023-12-05 ENCOUNTER — Other Ambulatory Visit (HOSPITAL_COMMUNITY)
Admission: RE | Admit: 2023-12-05 | Discharge: 2023-12-05 | Disposition: A | Source: Ambulatory Visit | Attending: Obstetrics and Gynecology | Admitting: Obstetrics and Gynecology

## 2023-12-05 ENCOUNTER — Ambulatory Visit: Admitting: Obstetrics and Gynecology

## 2023-12-05 ENCOUNTER — Other Ambulatory Visit: Payer: Self-pay | Admitting: Obstetrics and Gynecology

## 2023-12-05 VITALS — BP 113/75 | HR 75 | Ht 61.2 in | Wt 127.0 lb

## 2023-12-05 DIAGNOSIS — Z113 Encounter for screening for infections with a predominantly sexual mode of transmission: Secondary | ICD-10-CM

## 2023-12-05 DIAGNOSIS — Z01419 Encounter for gynecological examination (general) (routine) without abnormal findings: Secondary | ICD-10-CM

## 2023-12-05 DIAGNOSIS — R87610 Atypical squamous cells of undetermined significance on cytologic smear of cervix (ASC-US): Secondary | ICD-10-CM | POA: Diagnosis present

## 2023-12-05 DIAGNOSIS — Z1151 Encounter for screening for human papillomavirus (HPV): Secondary | ICD-10-CM | POA: Diagnosis not present

## 2023-12-05 DIAGNOSIS — Z124 Encounter for screening for malignant neoplasm of cervix: Secondary | ICD-10-CM

## 2023-12-05 DIAGNOSIS — Z1331 Encounter for screening for depression: Secondary | ICD-10-CM

## 2023-12-05 DIAGNOSIS — N946 Dysmenorrhea, unspecified: Secondary | ICD-10-CM

## 2023-12-05 NOTE — Progress Notes (Signed)
 ANNUAL EXAM Patient name: Ashley Levy MRN [id number]  Date of birth: 09/24/1985 Chief Complaint:   Gynecologic Exam (Last pap 04-04-2017 ASCUS)  History of Present Illness:   Ashley Levy is a 38 y.o. [phone]  female being seen today for a routine annual exam.  Current complaints: reports periods are painful, OTC meds do not help. She reports periods have been heavier and painful since her BTL in 2019.  She reports mood changes, but she manages well  Patient's last menstrual period was 11/17/2023.    The pregnancy intention screening data noted above was reviewed. Potential methods of contraception were discussed. The patient elected to proceed with Female Sterilization.   Gynecologic History Patient's last menstrual period was . Contraception: BTL Last Pap:2019 . Results were: ASCUS, HPV - Last mammogram:n/a     12/05/2023   11:27 AM 04/04/2017   10:32 AM  Depression screen PHQ 2/9  Decreased Interest 1 1  Down, Depressed, Hopeless 1 2  PHQ - 2 Score 2 3  Altered sleeping 3 2  Tired, decreased energy 3 3  Change in appetite 2 1  Feeling bad or failure about yourself  1 1  Trouble concentrating 3 2  Moving slowly or fidgety/restless 2 1  Suicidal thoughts 1 1  PHQ-9 Score 17 14        12/05/2023   11:28 AM  GAD 7 : Generalized Anxiety Score  Nervous, Anxious, on Edge 2  Control/stop worrying 3  Worry too much - different things 2  Trouble relaxing 2  Restless 2  Easily annoyed or irritable 1  Afraid - awful might happen 1  Total GAD 7 Score 13     Review of Systems:   Pertinent items are noted in HPI Denies any headaches, blurred vision, fatigue, shortness of breath, chest pain, abdominal pain, abnormal vaginal discharge/itching/odor/irritation, problems with periods, bowel movements, urination, or intercourse unless otherwise stated above. Pertinent History Reviewed:  Reviewed past medical,surgical, social and family history.  Reviewed problem list,  medications and allergies. Physical Assessment:   Vitals:   12/05/23 1123  BP: 113/75  Pulse: 75  Weight: 127 lb (57.6 kg)  Height: 5' 1.2 (1.554 m)  Body mass index is 23.84 kg/m.        Physical Examination:   General appearance - well appearing, and in no distress  Mental status - alert, oriented   Psych:  She has a normal mood and affect  Skin - warm and dry, normal color  Chest - effort normal, all lung fields clear to auscultation bilaterally  Heart - normal rate and regular rhythm  Neck:  midline trachea, no thyromegaly or nodules  Breasts - breasts appear normal, no suspicious masses, no skin or nipple changes or  axillary nodes  Abdomen - soft, nontender, nondistended  Pelvic - VULVA: normal appearing vulva with no masses, tenderness or lesions  VAGINA: normal appearing vagina with normal color and discharge, no lesions  CERVIX: normal appearing cervix without discharge or lesions, no CMT  Thin prep pap is done w HR HPV cotesting  UTERUS: uterus is felt to be normal size, shape, consistency and nontender   ADNEXA: No adnexal masses or tenderness noted.  Extremities:  No swelling or varicosities noted  Chaperone present for exam  No results found for this or any previous visit (from the past 24 hours).  Assessment & Plan:  1. Encounter for annual routine gynecological examination (Primary) Pap today Encourage self breast exam, discussed mammogram at 41  2. Cervical cancer screening 3. Atypical squamous cells of undetermined significance on cytologic smear of cervix (ASC-US ) Repeat pap today  - Cytology - PAP( [facility])  4. Screen for STD (sexually transmitted disease)  - RPR+HBsAg+HCVAb+... - Cervicovaginal ancillary only  5. Dysmenorrhea Discussed options for management to include hormonal management vs non hormonal  She is current smoker She desires to think about it, briefly discussed IUD to manage periods  2024 pelvic CT normal, mild bladder wall  thickening Discussed dosed Ibuprofen  -rx sent   6. Positive depression screening Discussed screening, she is not on medication She uses THC to help symptoms She declines support at this time, reports she is managing well  Labs/procedures today:   Mammogram: @ 37yo, or sooner if problems Colonoscopy: @ 38yo, or sooner if problems  Orders Placed This Encounter  Procedures   RPR+HBsAg+HCVAb+...    Meds: No orders of the defined types were placed in this encounter.   Follow-up: Return in about 1 year (around 12/04/2024) for [place] Nidia Delores, FNP

## 2023-12-06 LAB — RPR+HBSAG+HCVAB+...
HIV Screen 4th Generation wRfx: NONREACTIVE
Hep C Virus Ab: NONREACTIVE
Hepatitis B Surface Ag: NEGATIVE
RPR Ser Ql: NONREACTIVE

## 2023-12-06 MED ORDER — IBUPROFEN 600 MG PO TABS: 600.0000 mg | ORAL_TABLET | Freq: Four times a day (QID) | ORAL | 1 refills | Status: AC | PRN

## 2023-12-08 ENCOUNTER — Ambulatory Visit: Payer: Self-pay | Admitting: Obstetrics and Gynecology

## 2023-12-08 LAB — CERVICOVAGINAL ANCILLARY ONLY
Chlamydia: NEGATIVE
Comment: NEGATIVE
Comment: NEGATIVE
Comment: NORMAL
Neisseria Gonorrhea: NEGATIVE
Trichomonas: NEGATIVE

## 2023-12-09 ENCOUNTER — Telehealth: Payer: Self-pay

## 2023-12-09 LAB — CYTOLOGY - PAP
Adequacy: ABSENT
Comment: NEGATIVE
Diagnosis: NEGATIVE
Diagnosis: REACTIVE
High risk HPV: NEGATIVE

## 2023-12-09 NOTE — Telephone Encounter (Signed)
 Patient called RN back in regards to message left with mother. RN gave results to patient from last visit with a note that she would receive another call when her pap results are back. All questions answered.

## 2023-12-09 NOTE — Telephone Encounter (Signed)
 RN called to give patient results mother answered and patient unavailable. Mother to call back when patient is available to give results.

## 2023-12-12 ENCOUNTER — Ambulatory Visit: Payer: Self-pay | Admitting: Obstetrics and Gynecology

## 2023-12-15 NOTE — Telephone Encounter (Signed)
-----   Message from [facility] sent at 12/12/2023  7:04 PM EDT ----- Can we notify her about normal pap smear results, thanks! ----- Message ----- From: Interface, Lab In [facility] Sent: 12/08/2023   4:20 PM EDT To: Nidia LITTIE Daring, FNP

## 2023-12-15 NOTE — Telephone Encounter (Signed)
Left message @ 10:23 am. JSY ?
